# Patient Record
Sex: Female | Born: 1950 | Race: White | Hispanic: No | Smoking: Never smoker
Health system: Southern US, Community
[De-identification: ages and names within clinical notes are randomized; demographics above are authoritative.]

## PROBLEM LIST (undated history)

## (undated) DIAGNOSIS — K219 Gastro-esophageal reflux disease without esophagitis: Secondary | ICD-10-CM

## (undated) DIAGNOSIS — F419 Anxiety disorder, unspecified: Secondary | ICD-10-CM

## (undated) DIAGNOSIS — I209 Angina pectoris, unspecified: Secondary | ICD-10-CM

## (undated) DIAGNOSIS — R062 Wheezing: Secondary | ICD-10-CM

## (undated) DIAGNOSIS — E0500 Thyrotoxicosis with diffuse goiter without thyrotoxic crisis or storm: Secondary | ICD-10-CM

## (undated) DIAGNOSIS — E039 Hypothyroidism, unspecified: Secondary | ICD-10-CM

## (undated) DIAGNOSIS — F329 Major depressive disorder, single episode, unspecified: Secondary | ICD-10-CM

## (undated) DIAGNOSIS — R072 Precordial pain: Secondary | ICD-10-CM

## (undated) DIAGNOSIS — F32A Depression, unspecified: Secondary | ICD-10-CM

## (undated) DIAGNOSIS — I5189 Other ill-defined heart diseases: Secondary | ICD-10-CM

## (undated) DIAGNOSIS — G4730 Sleep apnea, unspecified: Secondary | ICD-10-CM

## (undated) DIAGNOSIS — E669 Obesity, unspecified: Secondary | ICD-10-CM

## (undated) DIAGNOSIS — I10 Essential (primary) hypertension: Secondary | ICD-10-CM

## (undated) DIAGNOSIS — M1990 Unspecified osteoarthritis, unspecified site: Secondary | ICD-10-CM

## (undated) DIAGNOSIS — E785 Hyperlipidemia, unspecified: Secondary | ICD-10-CM

## (undated) HISTORY — PX: OTHER SURGICAL HISTORY: SHX169

## (undated) HISTORY — DX: Wheezing: R06.2

## (undated) HISTORY — DX: Depression, unspecified: F32.A

## (undated) HISTORY — DX: Essential (primary) hypertension: I10

## (undated) HISTORY — DX: Gastro-esophageal reflux disease without esophagitis: K21.9

## (undated) HISTORY — PX: COLONOSCOPY: SHX5424

## (undated) HISTORY — DX: Other ill-defined heart diseases: I51.89

## (undated) HISTORY — DX: Hyperlipidemia, unspecified: E78.5

## (undated) HISTORY — DX: Thyrotoxicosis with diffuse goiter without thyrotoxic crisis or storm: E05.00

## (undated) HISTORY — DX: Anxiety disorder, unspecified: F41.9

## (undated) HISTORY — DX: Major depressive disorder, single episode, unspecified: F32.9

## (undated) HISTORY — DX: Obesity, unspecified: E66.9

## (undated) HISTORY — DX: Unspecified osteoarthritis, unspecified site: M19.90

## (undated) HISTORY — DX: Precordial pain: R07.2

---

## 1986-09-25 HISTORY — PX: BACK SURGERY: SHX140

## 2011-10-19 ENCOUNTER — Ambulatory Visit: Payer: Self-pay

## 2012-03-09 ENCOUNTER — Ambulatory Visit: Payer: Self-pay | Admitting: Sports Medicine

## 2012-06-23 ENCOUNTER — Emergency Department: Payer: Self-pay | Admitting: Emergency Medicine

## 2012-06-23 LAB — CBC WITH DIFFERENTIAL/PLATELET
Basophil #: 0.1 10*3/uL (ref 0.0–0.1)
Basophil %: 0.6 %
Eosinophil #: 0.1 10*3/uL (ref 0.0–0.7)
HCT: 40 % (ref 35.0–47.0)
Lymphocyte #: 2.2 10*3/uL (ref 1.0–3.6)
MCH: 29.5 pg (ref 26.0–34.0)
MCHC: 35.7 g/dL (ref 32.0–36.0)
MCV: 83 fL (ref 80–100)
Monocyte #: 0.9 x10 3/mm (ref 0.2–0.9)
Monocyte %: 8.7 %
Neutrophil #: 7.1 10*3/uL — ABNORMAL HIGH (ref 1.4–6.5)
Platelet: 237 10*3/uL (ref 150–440)
RDW: 13.6 % (ref 11.5–14.5)
WBC: 10.3 10*3/uL (ref 3.6–11.0)

## 2012-06-23 LAB — COMPREHENSIVE METABOLIC PANEL
Albumin: 3.9 g/dL (ref 3.4–5.0)
Alkaline Phosphatase: 81 U/L (ref 50–136)
BUN: 15 mg/dL (ref 7–18)
Bilirubin,Total: 1 mg/dL (ref 0.2–1.0)
Co2: 30 mmol/L (ref 21–32)
EGFR (African American): >60
Glucose: 116 mg/dL — ABNORMAL HIGH (ref 65–99)
Potassium: 3.9 mmol/L (ref 3.5–5.1)
SGOT(AST): 12 U/L — ABNORMAL LOW (ref 15–37)
SGPT (ALT): 16 U/L (ref 12–78)
Total Protein: 7.3 g/dL (ref 6.4–8.2)

## 2012-06-23 LAB — PROTIME-INR: Prothrombin Time: 12.9 secs (ref 11.5–14.7)

## 2012-06-23 LAB — TROPONIN I: Troponin-I: <0.02 ng/mL

## 2012-08-28 ENCOUNTER — Ambulatory Visit: Payer: Self-pay | Admitting: Gastroenterology

## 2012-08-28 LAB — KOH PREP

## 2012-08-29 LAB — PATHOLOGY REPORT

## 2013-05-26 HISTORY — PX: JOINT REPLACEMENT: SHX530

## 2013-06-02 ENCOUNTER — Ambulatory Visit: Payer: Self-pay | Admitting: General Practice

## 2013-06-02 LAB — BASIC METABOLIC PANEL
Anion Gap: 3 — ABNORMAL LOW (ref 7–16)
BUN: 18 mg/dL (ref 7–18)
Chloride: 103 mmol/L (ref 98–107)
Co2: 32 mmol/L (ref 21–32)
Creatinine: 0.77 mg/dL (ref 0.60–1.30)
EGFR (African American): >60
Glucose: 88 mg/dL (ref 65–99)
Osmolality: 277 (ref 275–301)
Potassium: 3.8 mmol/L (ref 3.5–5.1)

## 2013-06-02 LAB — PROTIME-INR
INR: 1
Prothrombin Time: 13.1 secs (ref 11.5–14.7)

## 2013-06-02 LAB — URINALYSIS, COMPLETE
Bacteria: NONE SEEN
Bilirubin,UR: NEGATIVE
Ketone: NEGATIVE
Leukocyte Esterase: NEGATIVE
Nitrite: NEGATIVE
Ph: 6 (ref 4.5–8.0)
Protein: NEGATIVE
RBC,UR: 1 /HPF (ref 0–5)
Specific Gravity: 1.016 (ref 1.003–1.030)
Squamous Epithelial: <1

## 2013-06-02 LAB — CBC
HCT: 37.5 % (ref 35.0–47.0)
HGB: 13.1 g/dL (ref 12.0–16.0)
MCH: 28.4 pg (ref 26.0–34.0)
MCHC: 34.8 g/dL (ref 32.0–36.0)
MCV: 82 fL (ref 80–100)
Platelet: 194 10*3/uL (ref 150–440)
RDW: 13.9 % (ref 11.5–14.5)

## 2013-06-02 LAB — SEDIMENTATION RATE: Erythrocyte Sed Rate: 8 mm/hr (ref 0–30)

## 2013-06-06 ENCOUNTER — Ambulatory Visit: Payer: Self-pay

## 2013-06-16 ENCOUNTER — Inpatient Hospital Stay: Payer: Self-pay | Admitting: General Practice

## 2013-06-17 LAB — PLATELET COUNT: Platelet: 205 10*3/uL (ref 150–440)

## 2013-06-17 LAB — BASIC METABOLIC PANEL
BUN: 13 mg/dL (ref 7–18)
Calcium, Total: 8.3 mg/dL — ABNORMAL LOW (ref 8.5–10.1)
Co2: 30 mmol/L (ref 21–32)
Creatinine: 0.83 mg/dL (ref 0.60–1.30)
EGFR (African American): >60
Glucose: 125 mg/dL — ABNORMAL HIGH (ref 65–99)
Osmolality: 270 (ref 275–301)
Sodium: 134 mmol/L — ABNORMAL LOW (ref 136–145)

## 2013-06-17 LAB — HEMOGLOBIN: HGB: 11.6 g/dL — ABNORMAL LOW (ref 12.0–16.0)

## 2013-06-18 LAB — HEMOGLOBIN: HGB: 11.4 g/dL — ABNORMAL LOW (ref 12.0–16.0)

## 2013-06-18 LAB — BASIC METABOLIC PANEL
Co2: 28 mmol/L (ref 21–32)
EGFR (African American): >60
Glucose: 101 mg/dL — ABNORMAL HIGH (ref 65–99)
Osmolality: 265 (ref 275–301)
Potassium: 3.6 mmol/L (ref 3.5–5.1)

## 2013-06-18 LAB — PLATELET COUNT: Platelet: 209 10*3/uL (ref 150–440)

## 2015-01-15 NOTE — Discharge Summary (Signed)
PATIENT NAME:  Tiffany Delacruz, Tiffany Delacruz MR#:  [id number] DATE OF BIRTH:  Oct 08, 1950  DATE OF ADMISSION:  06/16/2013 DATE OF DISCHARGE:  06/19/2013   DICTATING FOR: Jeneen Rinks P. Holley Bouche., MD  ADMITTING DIAGNOSIS: Degenerative arthrosis of right knee.   DISCHARGE DIAGNOSIS: Degenerative arthrosis of right knee.   HISTORY: The patient is a 64 year old female who has been followed at [facility] for progression of right knee pain. She had reported a long history of progressive right knee pain dating back several years. She had received a series of Synvisc injections with only modest improvement in her symptoms. Her latest series of injections earlier in the year did not successfully relieve her right knee pain. The patient states that the pain was localized mostly along the medial aspect of the knee. She had noticed progressive bowing of her knee. The patient states that the pain was aggravated with weight-bearing activities. She had also received physical therapy in the past primarily for strengthening. At the time of surgery, she was not using any ambulatory aid. She denied any gross locking or giving-way of the knee, but did occasionally have some near-giving-way. The patient states that the pain had increased to the point that it was significantly interfering with her activities of daily living. X-rays taken in [facility] showed bone-on-bone articulation with associated varus alignment. She was noted to have subchondral sclerosis as well as osteophyte formation along with degenerative changes to the patellofemoral articulation. After discussion of the risks and benefits of surgical intervention, the patient expressed her understanding of the risks and benefits and agreed for plans for surgical intervention.   PROCEDURE: Right total knee arthroplasty using computer-assisted navigation.   ANESTHESIA: Spinal.   SOFT TISSUE RELEASE: Anterior cruciate ligament, posterior cruciate ligament, deep  and superficial medial collateral ligaments as well as patellofemoral ligament.   IMPLANTS UTILIZED: DePuy PFC Sigma size 4 posterior stabilized femoral component (cemented), size 4 MBT tibial component (cemented), 35 mm 3-pegged oval dome patella (cemented), and a 10 mm stabilized rotating platform polyethylene insert. Gentamicin cement was utilized.   HOSPITAL COURSE: The patient tolerated the procedure very well. She had no complications. She was then taken to the PACU, where she was stabilized and then transferred to the orthopedic floor. The patient began receiving anticoagulation therapy of Lovenox 30 mg subcutaneous q.12 hours per anesthesia and pharmacy protocol. She was fitted with TED stockings bilaterally. These were allowed to be removed 1 hour per 8-hour shift. The right one was applied on day 2 following removal of the Hemovac and dressing change. The patient was also fitted with the AVI compression foot pumps bilaterally set at 80 mmHg. Her calves have been nontender. There has been no evidence of any DVTs. Negative Homans sign. Heels were elevated off the bed using rolled towels.   The patient has denied any chest pains or shortness of breath. Vital signs have been stable. She has been afebrile. Hemodynamically, she was stable, and no transfusions were given other than the Autovac transfusion given the first 6 hours postoperatively.   Physical therapy was initiated on day 1 for gait training and transfers. She has done very well. Upon being discharged, was ambulating greater than 200 feet. Was able go up 4 sets of steps. Range of motion of the knee showed full extension, with flexion being 90 degrees passively. Physical therapy was also initiated on day 1 for ADLs and assistive devices. She has progressed very nicely.   The patient's IV, Foley and Hemovac were discontinued  on day 2 along with the dressing change. The Polar Care was reapplied to the surgical leg, maintaining a temperature of  40 to 50 degrees Fahrenheit.   DISPOSITION: The patient is being discharged to home in improved stable condition.   DISCHARGE INSTRUCTIONS:  1. She will continue weight-bearing as tolerated. Continue using a walker until cleared by physical therapy to go to a quad cane.  2. She will receive home health PT.  3. Continue with TED stockings bilaterally. These are to be worn during the day, but may be removed at night.  4. Also continue the Polar Care, maintaining a temperature of 40 to 50 degrees Fahrenheit.  5. She was instructed on wound care.  6. She has a followup appointment in the [facility] on October 7th at 10:15.  7. She is to call the clinic sooner if any temperatures of 101.5 or greater or excessive bleeding.  8. She is placed on a regular diet.  9. She is to resume taking her medication that she was on prior to surgery. A prescription for oxycodone 5 to 10 mg q.4-6 hours p.r.n. for pain was given as well as Ultram 50 to 100 mg q.4-6 hours p.r.n. for pain. A prescription for Lovenox 40 mg subcutaneously daily for 14 days, then discontinue and begin taking one 81 mg enteric-coated aspirin.   PAST MEDICAL HISTORY: Hypertension, GERD, Graves disease, hyperthyroidism, Bell's palsy in 2011, obstructive sleep apnea.   ____________________________ Vance Peper, PA jrw:OSi D: 06/19/2013 07:15:00 ET T: 06/19/2013 08:03:01 ET JOB#: [id number]  cc: Vance Peper, PA, <Dictator> JON WOLFE PA ELECTRONICALLY SIGNED 06/24/2013 20:55

## 2015-01-15 NOTE — Op Note (Signed)
PATIENT NAME:  Tiffany Delacruz, Tiffany Delacruz MR#:  [id number] DATE OF BIRTH:  1951-09-01  DATE OF PROCEDURE:  06/16/2013  PREOPERATIVE DIAGNOSIS: Degenerative arthrosis of the right knee.   POSTOPERATIVE DIAGNOSIS: Degenerative arthrosis of the right knee.   PROCEDURE PERFORMED: Right total knee arthroplasty using computer-assisted navigation.   SURGEON: Rolene Arbour, M.D.  ASSISTANT:  Vance Peper, PA (required to maintain retraction throughout the procedure)   ANESTHESIA: Spinal.   ESTIMATED BLOOD LOSS: 100 mL.   FLUIDS REPLACED: 2300 mL of crystalloid.   TOURNIQUET TIME: 88 minutes.   DRAINS: Two medium drains to a reinfusion system.   SOFT TISSUE RELEASES: Anterior cruciate ligament, posterior cruciate ligament, deep and superficial medial collateral ligament and patellofemoral ligament.   IMPLANTS UTILIZED: DePuy PFC Sigma size 4 posterior stabilized femoral component (cemented), size 4 MBT tibia component (cemented), 35 mm three-peg oval dome patella (cemented), and a 10 mm stabilized rotating platform polyethylene insert. Gentamicin cement was utilized.   INDICATIONS FOR SURGERY: The patient is a 64 year old female who has been seen for complaints of progressive right knee pain. X-rays demonstrated severe degenerative changes in tricompartmental fashion with relative varus deformity. After discussion of the risks and benefits of surgical intervention, the patient expressed understanding of the risks, benefits, and agreed with plans for surgical intervention.   PROCEDURE IN DETAIL: The patient spoon the operating room and, after adequate spinal anesthesia was achieved, a tourniquet was placed on the patient's upper right thigh. The patient's right knee and leg were cleaned and prepped with alcohol and DuraPrep and draped in the usual sterile fashion. A "timeout" was performed as per usual protocol. The right lower extremity was exsanguinated using an Esmarch, and the tourniquet was inflated  to 300 mmHg. An anterior longitudinal incision was made followed by a standard mid vastus approach. A moderate effusion was evacuated. The deep fibers of the medial collateral ligament were elevated in a subperiosteal fashion off the medial flare of the tibia so as to maintain a continuous soft tissue sleeve. The patella was subluxed laterally and the patellofemoral ligament was incised. Inspection of the knee demonstrated severe degenerative changes most notably to the medial compartment. Prominent osteophytes were debrided using a rongeur. Anterior and posterior cruciate ligaments were excised. Two 4.0 mm Schanz pins were inserted into the femur and into the tibia for attachment of the array of trackers used for computer-assisted navigation. Hip center was identified using circumduction technique. Distal landmarks were mapped using the computer. The distal femur and proximal tibia were mapped using the computer. Distal femoral cutting guide was positioned using computer-assisted navigation so as to achieve a 5 degree distal valgus cut. Cut was performed and verified using the computer. Distal femur was sized and it was felt that a size 4 femoral component was appropriate. A size 4 cutting guide was positioned and the anterior cut was performed and verified using the computer. This was followed by completion of the posterior and chamfer cuts. Femoral cutting guide for the central box was then positioned and the central box cut was performed.   Attention was then directed to the proximal tibia. Medial and lateral menisci were excised. The extramedullary tibial cutting guide was positioned using computer-assisted navigation so as to achieve a 0 degree varus valgus alignment and 0 degree posterior slope. Cut was performed and verified using the computer. The proximal tibia was sized and it was felt that a size 4 tibial tray was appropriate. Tibial and femoral trials were inserted followed by insertion  of a 10 mm  polyethylene trial. The knee was felt to be tight medially. A Cobb elevator was used to elevate the superficial fibers of the medial collateral ligament. This allowed for excellent mediolateral soft tissue balancing both in full extension and in flexion. Finally, the patella was cut and prepared so as to accommodate a 35 mm three-peg oval dome patella. Patellar trial was placed, and the knee was placed through a range of motion with excellent patellar tracking appreciated.   Femoral component was removed after debridement of posterior osteophytes. Central post hole for the tibial component was reamed followed by insertion of a keel punch. Tibial trials were then removed. The cut surfaces of bone were irrigated with copious amounts of normal saline with antibiotic solution using pulsatile lavage and then suctioned dry. Polymethyl methacrylate cement with gentamicin was prepared in the usual fashion using a vacuum mixer. Cement was applied to the cut surface of the proximal tibia as well as along the undersurface of a size 4 MBT tibial component. The tibial component was positioned and impacted into place. Excess cement was removed using freer elevators. Cement was applied to the cut surface of the femur as well as along the posterior flanges of a size 4 posterior stabilized femoral component. Femoral component was positioned and impacted into place. Excess cement was removed using freer elevators. A 10 mm polyethylene trial was inserted and the knee was brought in full extension with steady axial compression applied. Finally, cement was applied to the backside of a 35 mm three-peg oval dome patella and the patellar component was positioned and patellar clamp applied. Excess cement was removed using freer elevators.   After adequate curing of cement, the tourniquet was deflated after total tourniquet time of 88 minutes. Hemostasis was achieved using electrocautery. The knee was irrigated with copious amounts of  normal saline with antibiotic solution using pulsatile lavage and then suctioned dry. The knee was inspected for any residual cement debris. Then, 20 mL of 1.3% Exparel and 40 mL of normal saline was injected along the posterior region, medial and lateral gutters, and along the arthrotomy site. A 10 mm stabilized rotating platform polyethylene insert was inserted and the knee was placed through a range of motion. Excellent mediolateral soft tissue balance was appreciated and excellent patellar tracking was appreciated. The two medium drains were placed in the wound bed and brought out through a separate stab incision to be attached to a reinfusion system. The medial parapatellar portion of the incision was reapproximated using interrupted sutures of #1 Vicryl. The subcutaneous tissue was injected with 30 mL of 0.25% Marcaine with epinephrine. The subcutaneous tissue was approximated in layers using first #0 Vicryl followed by 2-0 Vicryl. Skin was closed with skin staples. A sterile dressing was applied. The patient tolerated the procedure well. She was transported to the recovery room in stable condition.   ____________________________ Laurice Record. Holley Bouche., MD jph:cc D: 06/16/2013 23:02:27 ET T: 06/16/2013 23:42:41 ET JOB#: [id number]  cc: Laurice Record. Holley Bouche., MD, <Dictator> Laurice Record Holley Bouche MD ELECTRONICALLY SIGNED 06/28/2013 9:37

## 2015-04-02 ENCOUNTER — Other Ambulatory Visit: Payer: Self-pay

## 2015-04-02 NOTE — Telephone Encounter (Signed)
Patient was last seen on 12/05/13 for thyroid with f/u prn. Practice partner number is [phone] and pharmacy is [facility].

## 2015-04-03 MED ORDER — LEVOTHYROXINE SODIUM 200 MCG PO TABS: 200.0000 ug | ORAL_TABLET | Freq: Every day | ORAL | Status: DC

## 2015-04-03 NOTE — Telephone Encounter (Signed)
Please have patient schedule an appointment to see Tiffany Delacruz She is overdue for visit and labs I'll approve one month to give her time to get in to be seen

## 2015-04-05 NOTE — Telephone Encounter (Signed)
Left patient a voicemail to return my call and schedule a follow-up visit.

## 2015-04-06 NOTE — Telephone Encounter (Signed)
Called and left patient a voicemail to return my call and schedule a follow-up visit.

## 2015-04-07 NOTE — Telephone Encounter (Signed)
Called and left patient a voicemail to return my call and schedule a follow-up visit.

## 2015-04-15 DIAGNOSIS — K219 Gastro-esophageal reflux disease without esophagitis: Secondary | ICD-10-CM

## 2015-04-15 DIAGNOSIS — I10 Essential (primary) hypertension: Secondary | ICD-10-CM | POA: Insufficient documentation

## 2015-04-15 DIAGNOSIS — M1990 Unspecified osteoarthritis, unspecified site: Secondary | ICD-10-CM | POA: Insufficient documentation

## 2015-04-15 DIAGNOSIS — E785 Hyperlipidemia, unspecified: Secondary | ICD-10-CM | POA: Insufficient documentation

## 2015-04-15 DIAGNOSIS — F419 Anxiety disorder, unspecified: Secondary | ICD-10-CM | POA: Insufficient documentation

## 2015-04-15 DIAGNOSIS — F329 Major depressive disorder, single episode, unspecified: Secondary | ICD-10-CM | POA: Insufficient documentation

## 2015-04-15 DIAGNOSIS — E0500 Thyrotoxicosis with diffuse goiter without thyrotoxic crisis or storm: Secondary | ICD-10-CM | POA: Insufficient documentation

## 2015-04-15 DIAGNOSIS — F32A Depression, unspecified: Secondary | ICD-10-CM | POA: Insufficient documentation

## 2015-04-15 DIAGNOSIS — E669 Obesity, unspecified: Secondary | ICD-10-CM | POA: Insufficient documentation

## 2015-04-15 DIAGNOSIS — E039 Hypothyroidism, unspecified: Secondary | ICD-10-CM | POA: Insufficient documentation

## 2015-04-16 ENCOUNTER — Ambulatory Visit (INDEPENDENT_AMBULATORY_CARE_PROVIDER_SITE_OTHER): Payer: Medicare Other | Admitting: Unknown Physician Specialty

## 2015-04-16 ENCOUNTER — Encounter: Payer: Self-pay | Admitting: Unknown Physician Specialty

## 2015-04-16 VITALS — BP 132/69 | HR 91 | Temp 98.3°F | Ht 65.6 in | Wt 229.0 lb

## 2015-04-16 DIAGNOSIS — E039 Hypothyroidism, unspecified: Secondary | ICD-10-CM

## 2015-04-16 DIAGNOSIS — F32A Depression, unspecified: Secondary | ICD-10-CM

## 2015-04-16 DIAGNOSIS — I10 Essential (primary) hypertension: Secondary | ICD-10-CM | POA: Diagnosis not present

## 2015-04-16 DIAGNOSIS — F329 Major depressive disorder, single episode, unspecified: Secondary | ICD-10-CM | POA: Diagnosis not present

## 2015-04-16 LAB — MICROALBUMIN, URINE WAIVED
Creatinine, Urine Waived: 200 mg/dL (ref 10–300)
MICROALB, UR WAIVED: 30 mg/L — AB (ref 0–19)
Microalb/Creat Ratio: <30 mg/g (ref <30)

## 2015-04-16 LAB — LIPID PANEL PICCOLO, WAIVED
CHOLESTEROL PICCOLO, WAIVED: 187 mg/dL (ref <200)
Chol/HDL Ratio Piccolo,Waive: 3.3 mg/dL
HDL Chol Piccolo, Waived: 57 mg/dL — ABNORMAL LOW (ref >59)
LDL CHOL CALC PICCOLO WAIVED: 111 mg/dL — AB (ref <100)
Triglycerides Piccolo,Waived: 95 mg/dL (ref <150)
VLDL Chol Calc Piccolo,Waive: 19 mg/dL (ref <30)

## 2015-04-16 MED ORDER — LOSARTAN POTASSIUM 50 MG PO TABS: 50.0000 mg | ORAL_TABLET | Freq: Every day | ORAL | Status: DC

## 2015-04-16 MED ORDER — LEVOTHYROXINE SODIUM 200 MCG PO TABS: 200.0000 ug | ORAL_TABLET | Freq: Every day | ORAL | Status: DC

## 2015-04-16 MED ORDER — FLUOXETINE HCL 20 MG PO TABS: 20.0000 mg | ORAL_TABLET | Freq: Every day | ORAL | Status: DC

## 2015-04-16 NOTE — Progress Notes (Signed)
BP 132/69 mmHg  Pulse 91  Temp(Src) 98.3 F (36.8 C)  Ht 5' 5.6" (1.666 m)  Wt 229 lb (103.874 kg)  BMI 37.42 kg/m2  SpO2 97%  LMP 12/15/1991 (Approximate)   Subjective:    Patient ID: Tiffany Delacruz, female    DOB: July 17, 1951, 64 y.o.   MRN: [id number]  HPI: Tiffany Delacruz is a 64 y.o. female  Chief Complaint  Patient presents with  . Follow-up    pt states she is her for a medication follow-up   Hypothyroid Pt needs a refill.  No weight gain, temperature changes, or thinning of hair.  Dry skin probably related to age.    Hypertension This is a chronic (Taking Losartan daily but often misses a dose as feeling light headed.  this happens about twice a week.  ) problem. The problem is controlled. Pertinent negatives include no anxiety, chest pain, headaches, palpitations or shortness of breath. The current treatment provides significant improvement. Compliance problems: as above.    Depression This is stable.  See PHQ 2.  Uses an OTC sleep aid which she states works about as well as Ambien.    Relevant past medical, surgical, family and social history reviewed and updated as indicated. Interim medical history since our last visit reviewed. Allergies and medications reviewed and updated.    Review of Systems  Respiratory: Negative for shortness of breath.   Cardiovascular: Negative for chest pain and palpitations.  Neurological: Negative for headaches.    Per HPI unless specifically indicated above     Objective:    BP 132/69 mmHg  Pulse 91  Temp(Src) 98.3 F (36.8 C)  Ht 5' 5.6" (1.666 m)  Wt 229 lb (103.874 kg)  BMI 37.42 kg/m2  SpO2 97%  LMP 12/15/1991 (Approximate)  Wt Readings from Last 3 Encounters:  04/16/15 229 lb (103.874 kg)  07/09/14 224 lb (101.606 kg)    Physical Exam  Constitutional: She is oriented to person, place, and time. She appears well-developed and well-nourished. No distress.  HENT:  Head: Normocephalic and atraumatic.  Eyes:  Conjunctivae and lids are normal. Right eye exhibits no discharge. Left eye exhibits no discharge. No scleral icterus.  Cardiovascular: Normal rate and regular rhythm.   Pulmonary/Chest: Effort normal. No respiratory distress.  Abdominal: Normal appearance. There is no splenomegaly or hepatomegaly.  Musculoskeletal: Normal range of motion.  Neurological: She is alert and oriented to person, place, and time.  Skin: Skin is warm, dry and intact. No rash noted. No pallor.  Psychiatric: She has a normal mood and affect. Her behavior is normal. Judgment and thought content normal.      Assessment & Plan:   Problem List Items Addressed This Visit      Unprioritized   Hypothyroidism    Check TSH      Relevant Medications   levothyroxine (SYNTHROID, LEVOTHROID) 200 MCG tablet   Other Relevant Orders   TSH   Hypertension    Pt feeeling like she is on too high a dose.  Will reduce BP medication to Losartan 50 mg from Losartan HCTZ      Relevant Medications   losartan (COZAAR) 50 MG tablet   Other Relevant Orders   Lipid Panel Piccolo, Waived   Microalbumin, Urine Waived   Uric acid   Comprehensive metabolic panel   Depression - Primary    Depression is stable.  Continue present meds      Relevant Medications   FLUoxetine (PROZAC) 20 MG tablet  Follow up plan: Return in about 6 months (around 10/17/2015) for Physical.

## 2015-04-16 NOTE — Assessment & Plan Note (Signed)
Check TSH 

## 2015-04-16 NOTE — Assessment & Plan Note (Signed)
Depression is stable.  Continue present meds

## 2015-04-16 NOTE — Assessment & Plan Note (Signed)
Pt feeeling like she is on too high a dose.  Will reduce BP medication to Losartan 50 mg from Losartan HCTZ

## 2015-04-16 NOTE — Patient Instructions (Signed)
Check BP at home.  Goal is below 140/90

## 2015-04-17 LAB — COMPREHENSIVE METABOLIC PANEL
ALT: 10 IU/L (ref 0–32)
AST: 11 IU/L (ref 0–40)
Albumin/Globulin Ratio: 1.7 (ref 1.1–2.5)
Albumin: 4.1 g/dL (ref 3.6–4.8)
Alkaline Phosphatase: 81 IU/L (ref 39–117)
BILIRUBIN TOTAL: 0.8 mg/dL (ref 0.0–1.2)
BUN/Creatinine Ratio: 21 (ref 11–26)
BUN: 17 mg/dL (ref 8–27)
CALCIUM: 9.2 mg/dL (ref 8.7–10.3)
CO2: 23 mmol/L (ref 18–29)
Chloride: 100 mmol/L (ref 97–108)
Creatinine, Ser: 0.82 mg/dL (ref 0.57–1.00)
GFR, EST AFRICAN AMERICAN: 87 mL/min/{1.73_m2} (ref >59)
GFR, EST NON AFRICAN AMERICAN: 75 mL/min/{1.73_m2} (ref >59)
Globulin, Total: 2.4 g/dL (ref 1.5–4.5)
Glucose: 92 mg/dL (ref 65–99)
POTASSIUM: 4.3 mmol/L (ref 3.5–5.2)
SODIUM: 140 mmol/L (ref 134–144)
Total Protein: 6.5 g/dL (ref 6.0–8.5)

## 2015-04-17 LAB — URIC ACID: URIC ACID: 6.1 mg/dL (ref 2.5–7.1)

## 2015-04-17 LAB — TSH: TSH: 0.149 u[IU]/mL — ABNORMAL LOW (ref 0.450–4.500)

## 2015-04-19 ENCOUNTER — Other Ambulatory Visit: Payer: Self-pay | Admitting: Unknown Physician Specialty

## 2015-04-19 DIAGNOSIS — E039 Hypothyroidism, unspecified: Secondary | ICD-10-CM

## 2015-04-19 MED ORDER — LEVOTHYROXINE SODIUM 175 MCG PO TABS: 175.0000 ug | ORAL_TABLET | Freq: Every day | ORAL | Status: DC

## 2015-04-19 NOTE — Progress Notes (Signed)
Left a message about low TSH.  Will decrease dose of Synthroid to 175 mcgs.  Recheck TSH in 3 months

## 2015-07-23 ENCOUNTER — Other Ambulatory Visit: Payer: Self-pay | Admitting: Unknown Physician Specialty

## 2015-09-26 HISTORY — PX: BREAST BIOPSY: SHX20

## 2015-10-20 ENCOUNTER — Encounter: Payer: Self-pay | Admitting: Unknown Physician Specialty

## 2015-10-20 ENCOUNTER — Ambulatory Visit (INDEPENDENT_AMBULATORY_CARE_PROVIDER_SITE_OTHER): Payer: Medicare Other | Admitting: Unknown Physician Specialty

## 2015-10-20 VITALS — BP 134/85 | HR 86 | Temp 98.5°F | Ht 65.0 in | Wt 229.6 lb

## 2015-10-20 DIAGNOSIS — Z23 Encounter for immunization: Secondary | ICD-10-CM

## 2015-10-20 DIAGNOSIS — Z0000 Encounter for general adult medical examination without abnormal findings: Secondary | ICD-10-CM | POA: Diagnosis not present

## 2015-10-20 DIAGNOSIS — I10 Essential (primary) hypertension: Secondary | ICD-10-CM

## 2015-10-20 DIAGNOSIS — R7301 Impaired fasting glucose: Secondary | ICD-10-CM

## 2015-10-20 DIAGNOSIS — E785 Hyperlipidemia, unspecified: Secondary | ICD-10-CM

## 2015-10-20 DIAGNOSIS — F32A Depression, unspecified: Secondary | ICD-10-CM

## 2015-10-20 DIAGNOSIS — L989 Disorder of the skin and subcutaneous tissue, unspecified: Secondary | ICD-10-CM | POA: Diagnosis not present

## 2015-10-20 DIAGNOSIS — F329 Major depressive disorder, single episode, unspecified: Secondary | ICD-10-CM

## 2015-10-20 DIAGNOSIS — E669 Obesity, unspecified: Secondary | ICD-10-CM | POA: Diagnosis not present

## 2015-10-20 DIAGNOSIS — E039 Hypothyroidism, unspecified: Secondary | ICD-10-CM

## 2015-10-20 LAB — BAYER DCA HB A1C WAIVED: HB A1C (BAYER DCA - WAIVED): 5.3 % (ref <7.0)

## 2015-10-20 NOTE — Assessment & Plan Note (Signed)
Stable, continue present medications.   

## 2015-10-20 NOTE — Assessment & Plan Note (Signed)
Discussed diet and exercise 

## 2015-10-20 NOTE — Progress Notes (Signed)
BP 134/85 mmHg  Pulse 86  Temp(Src) 98.5 F (36.9 C)  Ht 5\' 5"  (1.651 m)  Wt 229 lb 9.6 oz (104.146 kg)  BMI 38.21 kg/m2  SpO2 97%  LMP 12/15/1991 (Approximate)   Subjective:    Patient ID: Tiffany Delacruz, female    DOB: 12-04-1950, 65 y.o.   MRN: [id number]  HPI: Tiffany Delacruz is a 65 y.o. female  Chief Complaint  Patient presents with  . Medicare Wellness   Functional Status Survey: Is the patient deaf or have difficulty hearing?: No Does the patient have difficulty seeing, even when wearing glasses/contacts?: Yes (pt states she has to wear reading glasses) Does the patient have difficulty concentrating, remembering, or making decisions?: No Does the patient have difficulty walking or climbing stairs?: No Does the patient have difficulty dressing or bathing?: No Does the patient have difficulty doing errands alone such as visiting a doctor's office or shopping?: No  Fall Risk  10/20/2015  Falls in the past year? No   Depression screen [facility] 2/9 10/20/2015 04/16/2015  Decreased Interest 0 0  Down, Depressed, Hopeless 0 0  PHQ - 2 Score 0 0    Hypertension Using medications without difficulty Average home BPs a little better than this AM   No problems or lightheadedness No chest pain with exertion or shortness of breath No Edema Diet compliance: "probably poor" Exercise: No exercising   Relevant past medical, surgical, family and social history reviewed and updated as indicated. Interim medical history since our last visit reviewed. Allergies and medications reviewed and updated.  Review of Systems  Constitutional: Negative.   HENT: Negative.   Eyes: Negative.   Respiratory: Negative.   Cardiovascular: Negative.   Gastrointestinal: Negative.   Endocrine: Negative.   Genitourinary: Negative.   Musculoskeletal: Negative.  Negative for myalgias.  Skin:       Red spot on nose.   Allergic/Immunologic: Negative.   Neurological: Negative.   Hematological:  Negative.   Psychiatric/Behavioral: Negative.     Per HPI unless specifically indicated above     Objective:    BP 134/85 mmHg  Pulse 86  Temp(Src) 98.5 F (36.9 C)  Ht 5\' 5"  (1.651 m)  Wt 229 lb 9.6 oz (104.146 kg)  BMI 38.21 kg/m2  SpO2 97%  LMP 12/15/1991 (Approximate)  Wt Readings from Last 3 Encounters:  10/20/15 229 lb 9.6 oz (104.146 kg)  04/16/15 229 lb (103.874 kg)  07/09/14 224 lb (101.606 kg)    Physical Exam  Constitutional: She is oriented to person, place, and time. She appears well-developed and well-nourished.  HENT:  Head: Normocephalic and atraumatic.  Eyes: Pupils are equal, round, and reactive to light. Right eye exhibits no discharge. Left eye exhibits no discharge. No scleral icterus.  Neck: Normal range of motion. Neck supple. Carotid bruit is not present. No thyromegaly present.  Cardiovascular: Normal rate, regular rhythm and normal heart sounds.  Exam reveals no gallop and no friction rub.   No murmur heard. Pulmonary/Chest: Effort normal and breath sounds normal. No respiratory distress. She has no wheezes. She has no rales.  Abdominal: Soft. Bowel sounds are normal. There is no tenderness. There is no rebound.  Genitourinary: No breast swelling, tenderness or discharge.  Musculoskeletal: Normal range of motion.  Lymphadenopathy:    She has no cervical adenopathy.  Neurological: She is alert and oriented to person, place, and time.  Skin: Skin is warm, dry and intact. No rash noted.  Red lesion on nose  Psychiatric: She  has a normal mood and affect. Her speech is normal and behavior is normal. Judgment and thought content normal. Cognition and memory are normal.    Results for orders placed or performed in visit on 04/16/15  Lipid Panel Piccolo, [employer]  Result Value Ref Range   Cholesterol Piccolo, Waived 187 <200 mg/dL   HDL Chol Piccolo, Waived 57 (L) >59 mg/dL   Triglycerides Piccolo,Waived 95 <150 mg/dL   Chol/HDL Ratio Piccolo,Waive 3.3  mg/dL   LDL Chol Calc Piccolo Waived 111 (H) <100 mg/dL   VLDL Chol Calc Piccolo,Waive 19 <30 mg/dL  Microalbumin, Urine Waived  Result Value Ref Range   Microalb, Ur Waived 30 (H) 0 - 19 mg/L   Creatinine, Urine Waived 200 10 - 300 mg/dL   Microalb/Creat Ratio <30 <30 mg/g  Uric acid  Result Value Ref Range   Uric Acid 6.1 2.5 - 7.1 mg/dL  Comprehensive metabolic panel  Result Value Ref Range   Glucose 92 65 - 99 mg/dL   BUN 17 8 - 27 mg/dL   Creatinine, Ser 0.82 0.57 - 1.00 mg/dL   GFR calc non Af Amer 75 >59 mL/min/1.73   GFR calc Af Amer 87 >59 mL/min/1.73   BUN/Creatinine Ratio 21 11 - 26   Sodium 140 134 - 144 mmol/L   Potassium 4.3 3.5 - 5.2 mmol/L   Chloride 100 97 - 108 mmol/L   CO2 23 18 - 29 mmol/L   Calcium 9.2 8.7 - 10.3 mg/dL   Total Protein 6.5 6.0 - 8.5 g/dL   Albumin 4.1 3.6 - 4.8 g/dL   Globulin, Total 2.4 1.5 - 4.5 g/dL   Albumin/Globulin Ratio 1.7 1.1 - 2.5   Bilirubin Total 0.8 0.0 - 1.2 mg/dL   Alkaline Phosphatase 81 39 - 117 IU/L   AST 11 0 - 40 IU/L   ALT 10 0 - 32 IU/L  TSH  Result Value Ref Range   TSH 0.149 (L) 0.450 - 4.500 uIU/mL      Assessment & Plan:   Problem List Items Addressed This Visit      Unprioritized   Hypothyroidism    Check TSH      Relevant Orders   TSH   Hypertension    Stable, continue present medications.        Relevant Orders   Comprehensive metabolic panel   Hyperlipidemia   Depression    Stable, continue present medications.        Obesity    Discussed diet and exercise       Other Visit Diagnoses    Need for pneumococcal vaccination    -  Primary    Relevant Orders    Pneumococcal conjugate vaccine 13-valent IM (Completed)    Immunization due        Relevant Orders    Flu Vaccine QUAD 36+ mos IM (Completed)    IFG (impaired fasting glucose)        Relevant Orders    Bayer DCA Hb A1c Waived    Routine general medical examination at a health care facility        Relevant Orders     Hepatitis C antibody    Skin lesion        refer to dermatology    Relevant Orders    Ambulatory referral to Dermatology        Follow up plan: Return in about 6 months (around 04/18/2016).

## 2015-10-20 NOTE — Assessment & Plan Note (Signed)
Check TSH 

## 2015-10-21 ENCOUNTER — Encounter: Payer: Self-pay | Admitting: Unknown Physician Specialty

## 2015-10-21 LAB — COMPREHENSIVE METABOLIC PANEL
ALT: 8 IU/L (ref 0–32)
AST: 13 IU/L (ref 0–40)
Albumin/Globulin Ratio: 1.7 (ref 1.1–2.5)
Albumin: 4.1 g/dL (ref 3.6–4.8)
Alkaline Phosphatase: 70 IU/L (ref 39–117)
BUN/Creatinine Ratio: 19 (ref 11–26)
BUN: 16 mg/dL (ref 8–27)
Bilirubin Total: 0.8 mg/dL (ref 0.0–1.2)
CALCIUM: 9.2 mg/dL (ref 8.7–10.3)
CHLORIDE: 99 mmol/L (ref 96–106)
CO2: 24 mmol/L (ref 18–29)
Creatinine, Ser: 0.85 mg/dL (ref 0.57–1.00)
GFR calc Af Amer: 83 mL/min/{1.73_m2} (ref >59)
GFR calc non Af Amer: 72 mL/min/{1.73_m2} (ref >59)
GLOBULIN, TOTAL: 2.4 g/dL (ref 1.5–4.5)
Glucose: 98 mg/dL (ref 65–99)
Potassium: 4 mmol/L (ref 3.5–5.2)
SODIUM: 140 mmol/L (ref 134–144)
Total Protein: 6.5 g/dL (ref 6.0–8.5)

## 2015-10-21 LAB — HEPATITIS C ANTIBODY: Hep C Virus Ab: <0.1 s/co ratio (ref 0.0–0.9)

## 2015-10-21 LAB — TSH: TSH: 4.48 u[IU]/mL (ref 0.450–4.500)

## 2015-10-22 ENCOUNTER — Other Ambulatory Visit: Payer: Self-pay | Admitting: Unknown Physician Specialty

## 2015-11-12 ENCOUNTER — Encounter: Payer: Self-pay | Admitting: Unknown Physician Specialty

## 2015-11-12 ENCOUNTER — Ambulatory Visit (INDEPENDENT_AMBULATORY_CARE_PROVIDER_SITE_OTHER): Payer: Medicare Other | Admitting: Unknown Physician Specialty

## 2015-11-12 VITALS — BP 123/79 | HR 90 | Temp 97.7°F | Ht 64.6 in | Wt 230.4 lb

## 2015-11-12 DIAGNOSIS — J3089 Other allergic rhinitis: Secondary | ICD-10-CM

## 2015-11-12 DIAGNOSIS — R05 Cough: Secondary | ICD-10-CM | POA: Diagnosis not present

## 2015-11-12 DIAGNOSIS — R059 Cough, unspecified: Secondary | ICD-10-CM

## 2015-11-12 MED ORDER — FLUTICASONE PROPIONATE 50 MCG/ACT NA SUSP: 2.0000 | Freq: Every day | NASAL | Status: DC

## 2015-11-12 NOTE — Progress Notes (Signed)
-    BP 123/79 mmHg  Pulse 90  Temp(Src) 97.7 F (36.5 C)  Ht 5' 4.6" (1.641 m)  Wt 230 lb 6.4 oz (104.509 kg)  BMI 38.81 kg/m2  SpO2 97%  LMP 12/15/1991 (Approximate)   Subjective:    Patient ID: Tiffany Delacruz, female    DOB: November 14, 1950, 65 y.o.   MRN: [id number]  HPI: Tiffany Delacruz is a 65 y.o. female  Chief Complaint  Patient presents with  . Cough    pt states she has had a cough for about 6 weeks. States she coughs up clear phlegm.   Cough States she has a cough that comes and goes.  She is taking Mucinex which helps.  This is ongoing from an illness that she had.  No aggravating or worsening factors.  She states she can go for long parts of the day without coughing.  No fever or nasal congestion.  She has never smoked but lived with a smoker and her dad was a smoker  Relevant past medical, surgical, family and social history reviewed and updated as indicated. Interim medical history since our last visit reviewed. Allergies and medications reviewed and updated.  Review of Systems  Per HPI unless specifically indicated above     Objective:    BP 123/79 mmHg  Pulse 90  Temp(Src) 97.7 F (36.5 C)  Ht 5' 4.6" (1.641 m)  Wt 230 lb 6.4 oz (104.509 kg)  BMI 38.81 kg/m2  SpO2 97%  LMP 12/15/1991 (Approximate)  Wt Readings from Last 3 Encounters:  11/12/15 230 lb 6.4 oz (104.509 kg)  10/20/15 229 lb 9.6 oz (104.146 kg)  04/16/15 229 lb (103.874 kg)    Physical Exam  Constitutional: She is oriented to person, place, and time. She appears well-developed and well-nourished. No distress.  HENT:  Head: Normocephalic and atraumatic.  Right Ear: Tympanic membrane and ear canal normal.  Left Ear: Tympanic membrane and ear canal normal.  Nose: Rhinorrhea present. Right sinus exhibits no maxillary sinus tenderness and no frontal sinus tenderness. Left sinus exhibits no maxillary sinus tenderness and no frontal sinus tenderness.  Mouth/Throat: Mucous membranes are normal.  Posterior oropharyngeal erythema present.  Eyes: Conjunctivae and lids are normal. Right eye exhibits no discharge. Left eye exhibits no discharge. No scleral icterus.  Cardiovascular: Normal rate and regular rhythm.   Pulmonary/Chest: Effort normal and breath sounds normal. No respiratory distress.  Abdominal: Normal appearance. There is no splenomegaly or hepatomegaly.  Musculoskeletal: Normal range of motion.  Neurological: She is alert and oriented to person, place, and time.  Skin: Skin is intact. No rash noted. No pallor.  Psychiatric: She has a normal mood and affect. Her behavior is normal. Judgment and thought content normal.      Assessment & Plan:   Problem List Items Addressed This Visit      Unprioritized   Other allergic rhinitis - Primary    Rx for flonase.         Other Visit Diagnoses    Cough        suspect related to allergies and post nasal drip        Follow up plan: Return if symptoms worsen or fail to improve.

## 2015-11-12 NOTE — Assessment & Plan Note (Signed)
Rx for flonase. 

## 2015-12-27 ENCOUNTER — Telehealth: Payer: Self-pay

## 2015-12-27 MED ORDER — FLUTICASONE PROPIONATE 50 MCG/ACT NA SUSP: 2.0000 | Freq: Every day | NASAL | Status: DC

## 2015-12-27 NOTE — Telephone Encounter (Signed)
We refilled patient's flonase in February, but the pharmacy sent a fax wanting [place] to send a 90 day prescription.

## 2016-01-19 ENCOUNTER — Other Ambulatory Visit: Payer: Self-pay | Admitting: Unknown Physician Specialty

## 2016-04-18 ENCOUNTER — Encounter: Payer: Self-pay | Admitting: Unknown Physician Specialty

## 2016-04-18 ENCOUNTER — Ambulatory Visit (INDEPENDENT_AMBULATORY_CARE_PROVIDER_SITE_OTHER): Payer: Medicare Other | Admitting: Unknown Physician Specialty

## 2016-04-18 DIAGNOSIS — I10 Essential (primary) hypertension: Secondary | ICD-10-CM

## 2016-04-18 DIAGNOSIS — F329 Major depressive disorder, single episode, unspecified: Secondary | ICD-10-CM | POA: Diagnosis not present

## 2016-04-18 DIAGNOSIS — E785 Hyperlipidemia, unspecified: Secondary | ICD-10-CM | POA: Diagnosis not present

## 2016-04-18 DIAGNOSIS — F32A Depression, unspecified: Secondary | ICD-10-CM

## 2016-04-18 NOTE — Assessment & Plan Note (Signed)
Refusing statins 

## 2016-04-18 NOTE — Progress Notes (Signed)
BP 134/83 (BP Location: Left Arm, Patient Position: Sitting, Cuff Size: Large)   Pulse 81   Temp 98 F (36.7 C)   Ht 5' 5.1" (1.654 m)   Wt 227 lb 12.8 oz (103.3 kg)   LMP 12/15/1991 (Approximate)   SpO2 96%   BMI 37.79 kg/m    Subjective:    Patient ID: Tiffany Delacruz, female    DOB: 1950-12-22, 65 y.o.   MRN: [id number]  HPI: Tiffany Delacruz is a 65 y.o. female  Chief Complaint  Patient presents with  . Depression  . Hypertension  . Hypothyroidism   Hypertension Using medications without difficulty Average home BPs   No problems or lightheadedness No chest pain with exertion Stopped her medication and then doubled up due to BP being high. Was SOB while doubled up but this has resolved.   No Edema  Hyperlipidemia Not checking labs as refusing statins Using medications without problems: Exercise:swimming  Depression Doing well Depression screen [facility] 2/9 04/18/2016 10/20/2015 04/16/2015  Decreased Interest 0 0 0  Down, Depressed, Hopeless 0 0 0  PHQ - 2 Score 0 0 0    Relevant past medical, surgical, family and social history reviewed and updated as indicated. Interim medical history since our last visit reviewed. Allergies and medications reviewed and updated.  Review of Systems  Respiratory: Positive for shortness of breath.     Per HPI unless specifically indicated above     Objective:    BP 134/83 (BP Location: Left Arm, Patient Position: Sitting, Cuff Size: Large)   Pulse 81   Temp 98 F (36.7 C)   Ht 5' 5.1" (1.654 m)   Wt 227 lb 12.8 oz (103.3 kg)   LMP 12/15/1991 (Approximate)   SpO2 96%   BMI 37.79 kg/m   Wt Readings from Last 3 Encounters:  04/18/16 227 lb 12.8 oz (103.3 kg)  11/12/15 230 lb 6.4 oz (104.5 kg)  10/20/15 229 lb 9.6 oz (104.1 kg)    Physical Exam  Constitutional: She is oriented to person, place, and time. She appears well-developed and well-nourished. No distress.  HENT:  Head: Normocephalic and atraumatic.  Eyes:  Conjunctivae and lids are normal. Right eye exhibits no discharge. Left eye exhibits no discharge. No scleral icterus.  Neck: Normal range of motion. Neck supple. No JVD present. Carotid bruit is not present.  Cardiovascular: Normal rate, regular rhythm and normal heart sounds.   Pulmonary/Chest: Effort normal and breath sounds normal.  Abdominal: Normal appearance. There is no splenomegaly or hepatomegaly.  Musculoskeletal: Normal range of motion.  Neurological: She is alert and oriented to person, place, and time.  Skin: Skin is warm, dry and intact. No rash noted. No pallor.  Psychiatric: She has a normal mood and affect. Her behavior is normal. Judgment and thought content normal.    Results for orders placed or performed in visit on 10/20/15  Hepatitis C antibody  Result Value Ref Range   Hep C Virus Ab <0.1 0.0 - 0.9 s/co ratio  TSH  Result Value Ref Range   TSH 4.480 0.450 - 4.500 uIU/mL  Comprehensive metabolic panel  Result Value Ref Range   Glucose 98 65 - 99 mg/dL   BUN 16 8 - 27 mg/dL   Creatinine, Ser 0.85 0.57 - 1.00 mg/dL   GFR calc non Af Amer 72 >59 mL/min/1.73   GFR calc Af Amer 83 >59 mL/min/1.73   BUN/Creatinine Ratio 19 11 - 26   Sodium 140 134 - 144 mmol/L  Potassium 4.0 3.5 - 5.2 mmol/L   Chloride 99 96 - 106 mmol/L   CO2 24 18 - 29 mmol/L   Calcium 9.2 8.7 - 10.3 mg/dL   Total Protein 6.5 6.0 - 8.5 g/dL   Albumin 4.1 3.6 - 4.8 g/dL   Globulin, Total 2.4 1.5 - 4.5 g/dL   Albumin/Globulin Ratio 1.7 1.1 - 2.5   Bilirubin Total 0.8 0.0 - 1.2 mg/dL   Alkaline Phosphatase 70 39 - 117 IU/L   AST 13 0 - 40 IU/L   ALT 8 0 - 32 IU/L  Bayer DCA Hb A1c Waived  Result Value Ref Range   Bayer DCA Hb A1c Waived 5.3 <7.0 %      Assessment & Plan:   Problem List Items Addressed This Visit      Unprioritized   Depression    Stable, continue present medications.        Hyperlipidemia    Refusing statins      Hypertension    Stable, continue present  medications.         Other Visit Diagnoses   None.      Follow up plan: Return in about 6 months (around 10/19/2016) for physical.

## 2016-04-18 NOTE — Assessment & Plan Note (Signed)
Stable, continue present medications.   

## 2016-04-24 ENCOUNTER — Other Ambulatory Visit: Payer: Self-pay | Admitting: Unknown Physician Specialty

## 2016-07-14 ENCOUNTER — Other Ambulatory Visit: Payer: Self-pay | Admitting: Unknown Physician Specialty

## 2016-07-14 NOTE — Telephone Encounter (Signed)
Your patient 

## 2016-07-18 ENCOUNTER — Other Ambulatory Visit: Payer: Self-pay | Admitting: Unknown Physician Specialty

## 2016-10-11 ENCOUNTER — Other Ambulatory Visit: Payer: Self-pay | Admitting: Unknown Physician Specialty

## 2016-10-19 ENCOUNTER — Other Ambulatory Visit: Payer: Self-pay | Admitting: Unknown Physician Specialty

## 2016-10-24 ENCOUNTER — Encounter: Payer: Self-pay | Admitting: Unknown Physician Specialty

## 2016-10-24 ENCOUNTER — Ambulatory Visit (INDEPENDENT_AMBULATORY_CARE_PROVIDER_SITE_OTHER): Payer: Medicare Other | Admitting: Unknown Physician Specialty

## 2016-10-24 VITALS — BP 144/84 | HR 76 | Temp 98.0°F | Ht 65.6 in | Wt 219.2 lb

## 2016-10-24 DIAGNOSIS — I10 Essential (primary) hypertension: Secondary | ICD-10-CM

## 2016-10-24 DIAGNOSIS — F3341 Major depressive disorder, recurrent, in partial remission: Secondary | ICD-10-CM | POA: Diagnosis not present

## 2016-10-24 DIAGNOSIS — E785 Hyperlipidemia, unspecified: Secondary | ICD-10-CM

## 2016-10-24 DIAGNOSIS — E039 Hypothyroidism, unspecified: Secondary | ICD-10-CM

## 2016-10-24 DIAGNOSIS — Z0000 Encounter for general adult medical examination without abnormal findings: Secondary | ICD-10-CM | POA: Diagnosis not present

## 2016-10-24 NOTE — Assessment & Plan Note (Signed)
Check TSH 

## 2016-10-24 NOTE — Assessment & Plan Note (Signed)
Not checking as refusing statins

## 2016-10-24 NOTE — Assessment & Plan Note (Signed)
BP better at home.  SBP 144.  With weight loss, continue present medication and recheck in 6 months

## 2016-10-24 NOTE — Progress Notes (Signed)
BP (!) 144/84 (BP Location: Left Arm, Cuff Size: Large)   Pulse 76   Temp 98 F (36.7 C)   Ht 5' 5.6" (1.666 m)   Wt 219 lb 3.2 oz (99.4 kg)   LMP 12/15/1991 (Approximate)   SpO2 98%   BMI 35.81 kg/m    Subjective:    Patient ID: Tiffany Delacruz, female    DOB: 12/24/1950, 66 y.o.   MRN: [id number]  HPI: Tiffany Delacruz is a 66 y.o. female  Chief Complaint  Patient presents with  . Medicare Wellness   Hypertension Using medications without difficulty Average home BPs 132/72  No problems or lightheadedness No chest pain with exertion or shortness of breath No Edema  Depression Doing well Depression screen [facility] 2/9 10/24/2016 04/18/2016 10/20/2015 04/16/2015  Decreased Interest 0 0 0 0  Down, Depressed, Hopeless 0 0 0 0  PHQ - 2 Score 0 0 0 0  Altered sleeping 0 - - -  Tired, decreased energy 0 - - -  Change in appetite 0 - - -  Feeling bad or failure about yourself  0 - - -  Trouble concentrating 0 - - -  Moving slowly or fidgety/restless 0 - - -  Suicidal thoughts 0 - - -  PHQ-9 Score 0 - - -     Obesity Recent weight loss through [employer] and walking  Hypothyroid Tired, losing hair and thick fingernails.  Taking thyroid meds regularly  Functional Status Survey: Is the patient deaf or have difficulty hearing?: No Does the patient have difficulty seeing, even when wearing glasses/contacts?: Yes Does the patient have difficulty concentrating, remembering, or making decisions?: Yes Does the patient have difficulty walking or climbing stairs?: No Does the patient have difficulty dressing or bathing?: No Does the patient have difficulty doing errands alone such as visiting a doctor's office or shopping?: No Fall Risk  10/24/2016 10/20/2015  Falls in the past year? No No   Mini cog is negative.    Relevant past medical, surgical, family and social history reviewed and updated as indicated. Interim medical history since our last visit reviewed. Allergies and  medications reviewed and updated.  Review of Systems  All other systems reviewed and are negative.   Per HPI unless specifically indicated above     Objective:    BP (!) 144/84 (BP Location: Left Arm, Cuff Size: Large)   Pulse 76   Temp 98 F (36.7 C)   Ht 5' 5.6" (1.666 m)   Wt 219 lb 3.2 oz (99.4 kg)   LMP 12/15/1991 (Approximate)   SpO2 98%   BMI 35.81 kg/m   Wt Readings from Last 3 Encounters:  10/24/16 219 lb 3.2 oz (99.4 kg)  04/18/16 227 lb 12.8 oz (103.3 kg)  11/12/15 230 lb 6.4 oz (104.5 kg)    Physical Exam  Constitutional: She is oriented to person, place, and time. She appears well-developed and well-nourished.  HENT:  Head: Normocephalic and atraumatic.  Eyes: Pupils are equal, round, and reactive to light. Right eye exhibits no discharge. Left eye exhibits no discharge. No scleral icterus.  Neck: Normal range of motion. Neck supple. Carotid bruit is not present. No thyromegaly present.  Cardiovascular: Normal rate, regular rhythm and normal heart sounds.  Exam reveals no gallop and no friction rub.   No murmur heard. Pulmonary/Chest: Effort normal and breath sounds normal. No respiratory distress. She has no wheezes. She has no rales.  Abdominal: Soft. Bowel sounds are normal. There is no tenderness. There  is no rebound.  Genitourinary: No breast swelling, tenderness or discharge.  Musculoskeletal: Normal range of motion.  Lymphadenopathy:    She has no cervical adenopathy.  Neurological: She is alert and oriented to person, place, and time.  Skin: Skin is warm, dry and intact. No rash noted.  Psychiatric: She has a normal mood and affect. Her speech is normal and behavior is normal. Judgment and thought content normal. Cognition and memory are normal.   Mini cog is negative.    Results for orders placed or performed in visit on 10/20/15  Hepatitis C antibody  Result Value Ref Range   Hep C Virus Ab <0.1 0.0 - 0.9 s/co ratio  TSH  Result Value Ref  Range   TSH 4.480 0.450 - 4.500 uIU/mL  Comprehensive metabolic panel  Result Value Ref Range   Glucose 98 65 - 99 mg/dL   BUN 16 8 - 27 mg/dL   Creatinine, Ser 0.85 0.57 - 1.00 mg/dL   GFR calc non Af Amer 72 >59 mL/min/1.73   GFR calc Af Amer 83 >59 mL/min/1.73   BUN/Creatinine Ratio 19 11 - 26   Sodium 140 134 - 144 mmol/L   Potassium 4.0 3.5 - 5.2 mmol/L   Chloride 99 96 - 106 mmol/L   CO2 24 18 - 29 mmol/L   Calcium 9.2 8.7 - 10.3 mg/dL   Total Protein 6.5 6.0 - 8.5 g/dL   Albumin 4.1 3.6 - 4.8 g/dL   Globulin, Total 2.4 1.5 - 4.5 g/dL   Albumin/Globulin Ratio 1.7 1.1 - 2.5   Bilirubin Total 0.8 0.0 - 1.2 mg/dL   Alkaline Phosphatase 70 39 - 117 IU/L   AST 13 0 - 40 IU/L   ALT 8 0 - 32 IU/L  Bayer DCA Hb A1c Waived  Result Value Ref Range   Bayer DCA Hb A1c Waived 5.3 <7.0 %      Assessment & Plan:   Problem List Items Addressed This Visit      Unprioritized   Depression    Stable, continue present medications.        Hyperlipidemia    Not checking as refusing statins      Hypertension    BP better at home.  SBP 144.  With weight loss, continue present medication and recheck in 6 months      Relevant Orders   Comprehensive metabolic panel   Hypothyroidism    Check TSH      Relevant Orders   TSH    Other Visit Diagnoses    Annual physical exam    -  Primary       Refusing mammogram and dexascan  Follow up plan: Return in about 6 months (around 04/23/2017).

## 2016-10-24 NOTE — Assessment & Plan Note (Signed)
Stable, continue present medications.   

## 2016-10-25 ENCOUNTER — Encounter: Payer: Self-pay | Admitting: Unknown Physician Specialty

## 2016-10-25 LAB — COMPREHENSIVE METABOLIC PANEL
A/G RATIO: 1.6 (ref 1.2–2.2)
ALBUMIN: 4.1 g/dL (ref 3.6–4.8)
ALT: 8 IU/L (ref 0–32)
AST: 10 IU/L (ref 0–40)
Alkaline Phosphatase: 65 IU/L (ref 39–117)
BILIRUBIN TOTAL: 0.6 mg/dL (ref 0.0–1.2)
BUN / CREAT RATIO: 23 (ref 12–28)
BUN: 19 mg/dL (ref 8–27)
CHLORIDE: 101 mmol/L (ref 96–106)
CO2: 24 mmol/L (ref 18–29)
Calcium: 9.3 mg/dL (ref 8.7–10.3)
Creatinine, Ser: 0.83 mg/dL (ref 0.57–1.00)
GFR calc non Af Amer: 73 mL/min/{1.73_m2} (ref >59)
GFR, EST AFRICAN AMERICAN: 84 mL/min/{1.73_m2} (ref >59)
GLUCOSE: 104 mg/dL — AB (ref 65–99)
Globulin, Total: 2.5 g/dL (ref 1.5–4.5)
Potassium: 4.4 mmol/L (ref 3.5–5.2)
Sodium: 141 mmol/L (ref 134–144)
TOTAL PROTEIN: 6.6 g/dL (ref 6.0–8.5)

## 2016-10-25 LAB — TSH: TSH: 0.272 u[IU]/mL — ABNORMAL LOW (ref 0.450–4.500)

## 2017-01-14 ENCOUNTER — Other Ambulatory Visit: Payer: Self-pay | Admitting: Unknown Physician Specialty

## 2017-01-23 ENCOUNTER — Other Ambulatory Visit: Payer: Self-pay | Admitting: Unknown Physician Specialty

## 2017-01-26 ENCOUNTER — Other Ambulatory Visit: Payer: Self-pay | Admitting: Unknown Physician Specialty

## 2017-01-26 NOTE — Telephone Encounter (Signed)
Letter generated and sent to patient.  

## 2017-01-26 NOTE — Telephone Encounter (Signed)
Needs labs

## 2017-04-17 ENCOUNTER — Other Ambulatory Visit: Payer: Medicare Other

## 2017-04-17 ENCOUNTER — Other Ambulatory Visit: Payer: Self-pay

## 2017-04-17 DIAGNOSIS — E039 Hypothyroidism, unspecified: Secondary | ICD-10-CM

## 2017-04-18 ENCOUNTER — Encounter: Payer: Self-pay | Admitting: Unknown Physician Specialty

## 2017-04-18 LAB — TSH: TSH: 0.514 u[IU]/mL (ref 0.450–4.500)

## 2017-04-19 ENCOUNTER — Other Ambulatory Visit: Payer: Self-pay | Admitting: Unknown Physician Specialty

## 2017-04-20 ENCOUNTER — Telehealth: Payer: Self-pay | Admitting: Unknown Physician Specialty

## 2017-04-20 NOTE — Telephone Encounter (Signed)
No I didn't.  But you can let her know they were normal

## 2017-04-20 NOTE — Telephone Encounter (Signed)
Patient states she missed a call from here and was thinking it may be regarding her thyroid lab.  Thanks  [phone]

## 2017-04-20 NOTE — Telephone Encounter (Signed)
I did not call this patient, Tiffany Delacruz did you?

## 2017-04-25 ENCOUNTER — Ambulatory Visit (INDEPENDENT_AMBULATORY_CARE_PROVIDER_SITE_OTHER): Payer: Medicare Other | Admitting: Unknown Physician Specialty

## 2017-04-25 ENCOUNTER — Encounter: Payer: Self-pay | Admitting: Unknown Physician Specialty

## 2017-04-25 DIAGNOSIS — I10 Essential (primary) hypertension: Secondary | ICD-10-CM

## 2017-04-25 DIAGNOSIS — E039 Hypothyroidism, unspecified: Secondary | ICD-10-CM

## 2017-04-25 DIAGNOSIS — F3341 Major depressive disorder, recurrent, in partial remission: Secondary | ICD-10-CM

## 2017-04-25 NOTE — Progress Notes (Signed)
BP 127/82   Pulse 85   Temp 98.3 F (36.8 C)   Ht 5' 6.1" (1.679 m)   Wt 221 lb 3.2 oz (100.3 kg)   LMP 12/15/1991 (Approximate)   SpO2 97%   BMI 35.59 kg/m    Subjective:    Patient ID: Tiffany Delacruz, female    DOB: 1951-02-18, 66 y.o.   MRN: [id number]  HPI: Tiffany Delacruz is a 66 y.o. female  Chief Complaint  Patient presents with  . Depression  . Hypertension  . Hypothyroidism   Hypertension Using medications without difficulty Average home BPs Not checking   No problems or lightheadedness No chest pain with exertion or shortness of breath No Edema  Hyperlipidemia Refuses statins  Hypothyroid Normal TSH 1 week ago  Depression History of but not taking Fluoxetine Depression screen [facility] 2/9 04/25/2017 10/24/2016 04/18/2016 10/20/2015 04/16/2015  Decreased Interest 0 0 0 0 0  Down, Depressed, Hopeless 0 0 0 0 0  PHQ - 2 Score 0 0 0 0 0  Altered sleeping 0 0 - - -  Tired, decreased energy 0 0 - - -  Change in appetite 0 0 - - -  Feeling bad or failure about yourself  0 0 - - -  Trouble concentrating 0 0 - - -  Moving slowly or fidgety/restless 0 0 - - -  Suicidal thoughts 0 0 - - -  PHQ-9 Score 0 0 - - -     Relevant past medical, surgical, family and social history reviewed and updated as indicated. Interim medical history since our last visit reviewed. Allergies and medications reviewed and updated.  Review of Systems  Per HPI unless specifically indicated above     Objective:    BP 127/82   Pulse 85   Temp 98.3 F (36.8 C)   Ht 5' 6.1" (1.679 m)   Wt 221 lb 3.2 oz (100.3 kg)   LMP 12/15/1991 (Approximate)   SpO2 97%   BMI 35.59 kg/m   Wt Readings from Last 3 Encounters:  04/25/17 221 lb 3.2 oz (100.3 kg)  10/24/16 219 lb 3.2 oz (99.4 kg)  04/18/16 227 lb 12.8 oz (103.3 kg)    Physical Exam  Constitutional: She is oriented to person, place, and time. She appears well-developed and well-nourished. No distress.  HENT:  Head: Normocephalic  and atraumatic.  Eyes: Conjunctivae and lids are normal. Right eye exhibits no discharge. Left eye exhibits no discharge. No scleral icterus.  Neck: Normal range of motion. Neck supple. No JVD present. Carotid bruit is not present.  Cardiovascular: Normal rate, regular rhythm and normal heart sounds.   Pulmonary/Chest: Effort normal and breath sounds normal.  Abdominal: Normal appearance. There is no splenomegaly or hepatomegaly.  Musculoskeletal: Normal range of motion.  Neurological: She is alert and oriented to person, place, and time.  Skin: Skin is warm, dry and intact. No rash noted. No pallor.  Psychiatric: She has a normal mood and affect. Her behavior is normal. Judgment and thought content normal.    Results for orders placed or performed in visit on 04/17/17  TSH  Result Value Ref Range   TSH 0.514 0.450 - 4.500 uIU/mL      Assessment & Plan:   Problem List Items Addressed This Visit      Unprioritized   Depression    Off of Fluoxetine.  Doing well      Hypertension    Stable, continue present medications.  Hypothyroidism    Stable labs           Follow up plan: Return in about 6 months (around 10/26/2017).

## 2017-04-25 NOTE — Assessment & Plan Note (Signed)
Stable labs

## 2017-04-25 NOTE — Assessment & Plan Note (Signed)
Stable, continue present medications.   

## 2017-04-25 NOTE — Assessment & Plan Note (Signed)
Off of Fluoxetine.  Doing well

## 2017-07-17 ENCOUNTER — Encounter
Admission: RE | Admit: 2017-07-17 | Discharge: 2017-07-17 | Disposition: A | Discharge status: Home / Self Care | Payer: Medicare Other | Source: Ambulatory Visit | Attending: Orthopedic Surgery | Admitting: Orthopedic Surgery

## 2017-07-17 ENCOUNTER — Other Ambulatory Visit: Payer: Self-pay | Admitting: Unknown Physician Specialty

## 2017-07-17 HISTORY — DX: Hypothyroidism, unspecified: E03.9

## 2017-07-17 HISTORY — DX: Sleep apnea, unspecified: G47.30

## 2017-07-17 NOTE — Patient Instructions (Signed)
Your procedure is scheduled on: 07-20-17 FRIDAY Report to Same Day Surgery 2nd floor medical mall [facility] Entrance-take elevator on left to 2nd floor.  Check in with surgery information desk.) To find out your arrival time please call [phone] between 1PM - 3PM on 07-19-17 THURSDAY  Remember: Instructions that are not followed completely may result in serious medical risk, up to and including death, or upon the discretion of your surgeon and anesthesiologist your surgery may need to be rescheduled.    _x___ 1. Do not eat food after midnight the night before your procedure. NO GUM CHEWING OR HARD CANDIES.  You may drink clear liquids up to 2 hours before you are scheduled to arrive at the hospital for your procedure.  Do not drink clear liquids within 2 hours of your scheduled arrival to the hospital.  Clear liquids include  --Water or Apple juice without pulp  --Clear carbohydrate beverage such as ClearFast or Gatorade  --Black Coffee or Clear Tea (No milk, no creamers, do not add anything to the coffee or Tea)  Type 1 and type 2 diabetics should only drink water.     __x__ 2. No Alcohol for 24 hours before or after surgery.   __x__3. No Smoking for 24 prior to surgery.   ____  4. Bring all medications with you on the day of surgery if instructed.    __x__ 5. Notify your doctor if there is any change in your medical condition     (cold, fever, infections).     Do not wear jewelry, make-up, hairpins, clips or nail polish.  Do not wear lotions, powders, or perfumes. You may wear deodorant.  Do not shave 48 hours prior to surgery. Men may shave face and neck.  Do not bring valuables to the hospital.    [facility] is not responsible for any belongings or valuables.               Contacts, dentures or bridgework may not be worn into surgery.  Leave your suitcase in the car. After surgery it may be brought to your room.  For patients admitted to the hospital, discharge time  is determined by your treatment team.   Patients discharged the day of surgery will not be allowed to drive home.  You will need someone to drive you home and stay with you the night of your procedure.    Please read over the following fact sheets that you were given:   [facility] Preparing for Surgery and or MRSA Information   _x___ TAKE THE FOLLOWING MEDICATIONS THE MORNING OF SURGERY WITH A SMALL SIP OF WATER. These include:  1. LEVOTHYROXINE  2. PEPCID  3. TAKE AN EXTRA PEPCID Thursday NIGHT BEFORE BED  4.  5.  6.  ____Fleets enema or Magnesium Citrate as directed.   _x___ Use CHG Soap or sage wipes as directed on instruction sheet   ____ Use inhalers on the day of surgery and bring to hospital day of surgery  ____ Stop Metformin and Janumet 2 days prior to surgery.    ____ Take 1/2 of usual insulin dose the night before surgery and none on the morning surgery.   ____ Follow recommendations from Cardiologist, Pulmonologist or PCP regarding stopping Aspirin, Coumadin, Plavix ,Eliquis, Effient, or Pradaxa, and Pletal.  X____Stop Anti-inflammatories such as Advil, Aleve, Ibuprofen, Motrin, Naproxen, Naprosyn, Goodies powders or aspirin products NOW-OK to take Tylenol    ____ Stop supplements until after surgery.  ____ Bring C-Pap to the hospital.

## 2017-07-18 ENCOUNTER — Other Ambulatory Visit: Payer: Self-pay | Admitting: Unknown Physician Specialty

## 2017-07-18 ENCOUNTER — Encounter
Admission: RE | Admit: 2017-07-18 | Discharge: 2017-07-18 | Disposition: A | Discharge status: Home / Self Care | Payer: Medicare Other | Source: Ambulatory Visit | Attending: Orthopedic Surgery | Admitting: Orthopedic Surgery

## 2017-07-18 DIAGNOSIS — I10 Essential (primary) hypertension: Secondary | ICD-10-CM | POA: Diagnosis not present

## 2017-07-18 DIAGNOSIS — Z79899 Other long term (current) drug therapy: Secondary | ICD-10-CM | POA: Diagnosis not present

## 2017-07-18 DIAGNOSIS — K219 Gastro-esophageal reflux disease without esophagitis: Secondary | ICD-10-CM | POA: Diagnosis not present

## 2017-07-18 DIAGNOSIS — Z800 Family history of malignant neoplasm of digestive organs: Secondary | ICD-10-CM | POA: Diagnosis not present

## 2017-07-18 DIAGNOSIS — Z96651 Presence of right artificial knee joint: Secondary | ICD-10-CM | POA: Diagnosis not present

## 2017-07-18 DIAGNOSIS — Z7951 Long term (current) use of inhaled steroids: Secondary | ICD-10-CM | POA: Diagnosis not present

## 2017-07-18 DIAGNOSIS — G5602 Carpal tunnel syndrome, left upper limb: Secondary | ICD-10-CM | POA: Diagnosis not present

## 2017-07-18 DIAGNOSIS — Z8261 Family history of arthritis: Secondary | ICD-10-CM | POA: Diagnosis not present

## 2017-07-18 DIAGNOSIS — G4730 Sleep apnea, unspecified: Secondary | ICD-10-CM | POA: Diagnosis not present

## 2017-07-20 ENCOUNTER — Encounter: Payer: Self-pay | Admitting: Orthopedic Surgery

## 2017-07-20 ENCOUNTER — Ambulatory Visit: Payer: Medicare Other | Admitting: Registered Nurse

## 2017-07-20 ENCOUNTER — Encounter
Admission: RE | Disposition: A | Discharge status: Home / Self Care | Payer: Self-pay | Source: Ambulatory Visit | Attending: Orthopedic Surgery

## 2017-07-20 ENCOUNTER — Ambulatory Visit
Admission: RE | Admit: 2017-07-20 | Discharge: 2017-07-20 | Disposition: A | Discharge status: Home / Self Care | Payer: Medicare Other | Source: Ambulatory Visit | Attending: Orthopedic Surgery | Admitting: Orthopedic Surgery

## 2017-07-20 DIAGNOSIS — I10 Essential (primary) hypertension: Secondary | ICD-10-CM | POA: Insufficient documentation

## 2017-07-20 DIAGNOSIS — Z79899 Other long term (current) drug therapy: Secondary | ICD-10-CM | POA: Insufficient documentation

## 2017-07-20 DIAGNOSIS — Z7951 Long term (current) use of inhaled steroids: Secondary | ICD-10-CM | POA: Insufficient documentation

## 2017-07-20 DIAGNOSIS — G4730 Sleep apnea, unspecified: Secondary | ICD-10-CM | POA: Insufficient documentation

## 2017-07-20 DIAGNOSIS — Z96651 Presence of right artificial knee joint: Secondary | ICD-10-CM | POA: Insufficient documentation

## 2017-07-20 DIAGNOSIS — K219 Gastro-esophageal reflux disease without esophagitis: Secondary | ICD-10-CM | POA: Insufficient documentation

## 2017-07-20 DIAGNOSIS — Z8261 Family history of arthritis: Secondary | ICD-10-CM | POA: Insufficient documentation

## 2017-07-20 DIAGNOSIS — G5602 Carpal tunnel syndrome, left upper limb: Secondary | ICD-10-CM | POA: Diagnosis not present

## 2017-07-20 DIAGNOSIS — Z800 Family history of malignant neoplasm of digestive organs: Secondary | ICD-10-CM | POA: Insufficient documentation

## 2017-07-20 HISTORY — PX: CARPAL TUNNEL RELEASE: SHX101

## 2017-07-20 SURGERY — CARPAL TUNNEL RELEASE
Anesthesia: General | Laterality: Left | Wound class: Clean

## 2017-07-20 MED ORDER — FENTANYL CITRATE (PF) 100 MCG/2ML IJ SOLN
INTRAMUSCULAR | Status: AC
  Filled 2017-07-20: qty 2

## 2017-07-20 MED ORDER — ONDANSETRON HCL 4 MG/2ML IJ SOLN: 4.0000 mg | Freq: Once | INTRAMUSCULAR | Status: DC | PRN

## 2017-07-20 MED ORDER — HYDROCODONE-ACETAMINOPHEN 5-325 MG PO TABS: 1.0000 | ORAL_TABLET | ORAL | 0 refills | Status: DC | PRN

## 2017-07-20 MED ORDER — PROPOFOL 10 MG/ML IV BOLUS
INTRAVENOUS | Status: AC
  Filled 2017-07-20: qty 20

## 2017-07-20 MED ORDER — FAMOTIDINE 20 MG PO TABS
ORAL_TABLET | ORAL | Status: AC
  Administered 2017-07-20: 20 mg
  Filled 2017-07-20: qty 1

## 2017-07-20 MED ORDER — ONDANSETRON HCL 4 MG PO TABS: 4.0000 mg | ORAL_TABLET | Freq: Four times a day (QID) | ORAL | Status: DC | PRN

## 2017-07-20 MED ORDER — BUPIVACAINE HCL (PF) 0.25 % IJ SOLN
INTRAMUSCULAR | Status: DC | PRN
  Administered 2017-07-20: 10 mL

## 2017-07-20 MED ORDER — LIDOCAINE HCL (PF) 2 % IJ SOLN
INTRAMUSCULAR | Status: AC
  Filled 2017-07-20: qty 10

## 2017-07-20 MED ORDER — MIDAZOLAM HCL 2 MG/2ML IJ SOLN
INTRAMUSCULAR | Status: DC | PRN
Start: 2017-07-20 — End: 2017-07-20
  Administered 2017-07-20: 2 mg via INTRAVENOUS

## 2017-07-20 MED ORDER — DEXAMETHASONE SODIUM PHOSPHATE 10 MG/ML IJ SOLN
INTRAMUSCULAR | Status: AC
  Filled 2017-07-20: qty 1

## 2017-07-20 MED ORDER — DEXAMETHASONE SODIUM PHOSPHATE 10 MG/ML IJ SOLN
INTRAMUSCULAR | Status: DC | PRN
  Administered 2017-07-20: 10 mg via INTRAVENOUS

## 2017-07-20 MED ORDER — FENTANYL CITRATE (PF) 100 MCG/2ML IJ SOLN
INTRAMUSCULAR | Status: DC | PRN
  Administered 2017-07-20 (×2): 25 ug via INTRAVENOUS
  Administered 2017-07-20: 50 ug via INTRAVENOUS

## 2017-07-20 MED ORDER — CEFAZOLIN SODIUM 1 G IJ SOLR
INTRAMUSCULAR | Status: AC
  Filled 2017-07-20: qty 20

## 2017-07-20 MED ORDER — ONDANSETRON HCL 4 MG/2ML IJ SOLN: 4.0000 mg | Freq: Four times a day (QID) | INTRAMUSCULAR | Status: DC | PRN

## 2017-07-20 MED ORDER — METOCLOPRAMIDE HCL 5 MG/ML IJ SOLN
5.0000 mg | Freq: Three times a day (TID) | INTRAMUSCULAR | Status: DC | PRN
Start: 2017-07-20 — End: 2017-07-20

## 2017-07-20 MED ORDER — BUPIVACAINE HCL (PF) 0.25 % IJ SOLN
INTRAMUSCULAR | Status: AC
  Filled 2017-07-20: qty 10

## 2017-07-20 MED ORDER — MIDAZOLAM HCL 2 MG/2ML IJ SOLN
INTRAMUSCULAR | Status: AC
  Filled 2017-07-20: qty 2

## 2017-07-20 MED ORDER — LIDOCAINE HCL (CARDIAC) 20 MG/ML IV SOLN
INTRAVENOUS | Status: DC | PRN
  Administered 2017-07-20: 50 mg via INTRAVENOUS

## 2017-07-20 MED ORDER — PROPOFOL 10 MG/ML IV BOLUS
INTRAVENOUS | Status: DC | PRN
Start: 2017-07-20 — End: 2017-07-20
  Administered 2017-07-20: 130 mg via INTRAVENOUS
  Administered 2017-07-20: 30 mg via INTRAVENOUS

## 2017-07-20 MED ORDER — METOCLOPRAMIDE HCL 10 MG PO TABS: 5.0000 mg | ORAL_TABLET | Freq: Three times a day (TID) | ORAL | Status: DC | PRN

## 2017-07-20 MED ORDER — FENTANYL CITRATE (PF) 100 MCG/2ML IJ SOLN: 25.0000 ug | INTRAMUSCULAR | Status: DC | PRN

## 2017-07-20 MED ORDER — ONDANSETRON HCL 4 MG/2ML IJ SOLN
INTRAMUSCULAR | Status: DC | PRN
  Administered 2017-07-20: 4 mg via INTRAVENOUS

## 2017-07-20 MED ORDER — EPHEDRINE SULFATE 50 MG/ML IJ SOLN
INTRAMUSCULAR | Status: DC | PRN
  Administered 2017-07-20: 10 mg via INTRAVENOUS

## 2017-07-20 MED ORDER — EPHEDRINE SULFATE 50 MG/ML IJ SOLN
INTRAMUSCULAR | Status: AC
  Filled 2017-07-20: qty 1

## 2017-07-20 MED ORDER — NEOMYCIN-POLYMYXIN B GU 40-200000 IR SOLN
Status: DC | PRN
  Administered 2017-07-20: 2 mL

## 2017-07-20 MED ORDER — PHENYLEPHRINE HCL 10 MG/ML IJ SOLN
INTRAMUSCULAR | Status: DC | PRN
Start: 2017-07-20 — End: 2017-07-20
  Administered 2017-07-20 (×2): 100 ug via INTRAVENOUS

## 2017-07-20 MED ORDER — LACTATED RINGERS IV SOLN
INTRAVENOUS | Status: DC
  Administered 2017-07-20 (×2): via INTRAVENOUS

## 2017-07-20 MED ORDER — CEFAZOLIN SODIUM-DEXTROSE 2-3 GM-%(50ML) IV SOLR
INTRAVENOUS | Status: DC | PRN
  Administered 2017-07-20: 2 g via INTRAVENOUS

## 2017-07-20 MED ORDER — CHLORHEXIDINE GLUCONATE 4 % EX LIQD: 60.0000 mL | Freq: Once | CUTANEOUS | Status: DC

## 2017-07-20 MED ORDER — ONDANSETRON HCL 4 MG/2ML IJ SOLN
INTRAMUSCULAR | Status: AC
  Filled 2017-07-20: qty 2

## 2017-07-20 MED ORDER — NEOMYCIN-POLYMYXIN B GU 40-200000 IR SOLN
Status: AC
  Filled 2017-07-20: qty 2

## 2017-07-20 SURGICAL SUPPLY — 26 items
BANDAGE ELASTIC 3 LF NS (GAUZE/BANDAGES/DRESSINGS) ×3 IMPLANT
BNDG ESMARK 4X12 TAN STRL LF (GAUZE/BANDAGES/DRESSINGS) ×3 IMPLANT
CANISTER SUCT 1200ML W/VALVE (MISCELLANEOUS) ×3 IMPLANT
CAST PADDING 3X4FT ST 30246 (SOFTGOODS) ×2
COVER LIGHT HANDLE STERIS (MISCELLANEOUS) ×3 IMPLANT
CUFF TOURN 18 STER (MISCELLANEOUS) ×3 IMPLANT
DRSG DERMACEA 8X12 NADH (GAUZE/BANDAGES/DRESSINGS) ×3 IMPLANT
DURAPREP 26ML APPLICATOR (WOUND CARE) ×3 IMPLANT
ELECT CAUTERY BLADE 6.4 (BLADE) ×3 IMPLANT
ELECT REM PT RETURN 9FT ADLT (ELECTROSURGICAL) ×3
ELECTRODE REM PT RTRN 9FT ADLT (ELECTROSURGICAL) ×1 IMPLANT
GAUZE SPONGE 4X4 12PLY STRL (GAUZE/BANDAGES/DRESSINGS) ×3 IMPLANT
GLOVE BIOGEL M STRL SZ7.5 (GLOVE) ×6 IMPLANT
GLOVE INDICATOR 8.0 STRL GRN (GLOVE) ×6 IMPLANT
GOWN STRL REUS W/ TWL LRG LVL3 (GOWN DISPOSABLE) ×2 IMPLANT
GOWN STRL REUS W/TWL LRG LVL3 (GOWN DISPOSABLE) ×4
KIT RM TURNOVER STRD PROC AR (KITS) ×3 IMPLANT
NS IRRIG 500ML POUR BTL (IV SOLUTION) ×3 IMPLANT
PACK EXTREMITY ARMC (MISCELLANEOUS) ×3 IMPLANT
PAD CAST CTTN 3X4 STRL (SOFTGOODS) ×1 IMPLANT
SOL PREP PVP 2OZ (MISCELLANEOUS) ×3
SOLUTION PREP PVP 2OZ (MISCELLANEOUS) ×1 IMPLANT
SPLINT CAST 1 STEP 3X12 (MISCELLANEOUS) ×3 IMPLANT
STOCKINETTE STRL 4IN 9604848 (GAUZE/BANDAGES/DRESSINGS) ×3 IMPLANT
SUT ETHILON 5-0 FS-2 18 BLK (SUTURE) ×3 IMPLANT
SYR 3ML 18GX1 1/2 (SYRINGE) ×3 IMPLANT

## 2017-07-20 NOTE — Transfer of Care (Signed)
Immediate Anesthesia Transfer of Care Note  Patient: Tiffany Delacruz  Procedure(s) Performed: CARPAL TUNNEL RELEASE (Left )  Patient Location: PACU  Anesthesia Type:General  Level of Consciousness: drowsy and patient cooperative  Airway & Oxygen Therapy: Patient Spontanous Breathing and Patient connected to face mask oxygen  Post-op Assessment: Report given to RN, Post -op Vital signs reviewed and stable and Patient moving all extremities X 4  Post vital signs: Reviewed and stable  Last Vitals:  Vitals:   07/20/17 0814  BP: (!) 141/78  Pulse: 83  Resp: 19  Temp: 36.4 C  SpO2: 97%    Last Pain:  Vitals:   07/20/17 0814  TempSrc: Oral         Complications: No apparent anesthesia complications

## 2017-07-20 NOTE — Progress Notes (Signed)
Ice to left wrist  Can wiggle fingers on left   Capillary refill positive to left upper extremity

## 2017-07-20 NOTE — Discharge Instructions (Addendum)
°  Instructions after Hand / Wrist Surgery ° ° James P. Hooten, Jr., M.D. ° Dept. of Orthopaedics & Sports Medicine ° [facility] ° [address] ° [place], [place]  [address] ° ° Phone: [phone]   Fax: [phone] ° ° °DIET: °• Drink plenty of non-alcoholic fluids & begin a light diet. °• Resume your normal diet the day after surgery. ° °ACTIVITY:  °• Keep the hand elevated above the level of the elbow. °• Begin gently moving the fingers on a regular basis to avoid stiffness. °• Avoid any heavy lifting, pushing, or pulling with the operative hand. °• Do not drive or operate any equipment until instructed. ° °WOUND CARE:  °• Keep the splint/bandage clean and dry.  °• The splint and stitches will be removed in the office. °• Continue to use the ice packs periodically to reduce pain and swelling. °• You may bathe or shower after the stitches are removed at the first office visit following surgery. ° °MEDICATIONS: °• You may resume your regular medications. °• Please take the pain medication as prescribed. °• Do not take pain medication on an empty stomach. °• Do not drive or drink alcoholic beverages when taking pain medications. ° °CALL THE OFFICE FOR: °• Temperature above 101 degrees °• Excessive bleeding or drainage on the dressing. °• Excessive swelling, coldness, or paleness of the fingers. °• Persistent nausea and vomiting. ° °FOLLOW-UP:  °• You should have an appointment to return to the office in 7-10 days after surgery.  ° °REMEMBER: R.I.C.E. = Rest, Ice, Compression, Elevation !  ° ° ° °AMBULATORY SURGERY  °DISCHARGE INSTRUCTIONS ° ° °1) The drugs that you were given will stay in your system until tomorrow so for the next 24 hours you should not: ° °A) Drive an automobile °B) Make any legal decisions °C) Drink any alcoholic beverage ° ° °2) You may resume regular meals tomorrow.  Today it is better to start with liquids and gradually work up to solid foods. ° °You may eat anything you prefer, but  it is better to start with liquids, then soup and crackers, and gradually work up to solid foods. ° ° °3) Please notify your doctor immediately if you have any unusual bleeding, trouble breathing, redness and pain at the surgery site, drainage, fever, or pain not relieved by medication. ° ° ° °4) Additional Instructions: ° ° ° ° ° ° ° °Please contact your physician with any problems or Same Day Surgery at [phone], Monday through Friday 6 am to 4 pm, or [place] [facility] [place] Main number at [phone]. °

## 2017-07-20 NOTE — Op Note (Signed)
OPERATIVE NOTE  DATE OF SURGERY:  07/20/2017  PATIENT NAME:  Tiffany Delacruz   DOB: 05/16/1951  MRN: [id number]  PRE-OPERATIVE DIAGNOSIS: Left carpal tunnel syndrome  POST-OPERATIVE DIAGNOSIS:  Same  PROCEDURE:  Left carpal tunnel release  SURGEON:  Marciano Sequin. M.D.  ANESTHESIA: general  ESTIMATED BLOOD LOSS: Minimal  FLUIDS REPLACED: 650 mL of crystalloid  TOURNIQUET TIME: 33 minutes  DRAINS: None  INDICATIONS FOR SURGERY: Tiffany Delacruz is a 65 y.o. year old female with a long history of numbness and paresthesias to the left hand. EMG/nerve conduction studies demonstrated findings consistent with carpal tunnel syndrome.The patient had not seen any significant improvement despite conservative nonsurgical intervention. After discussion of the risks and benefits of surgical intervention, the patient expressed understanding of the risks benefits and agree with plans for carpal tunnel release.   PROCEDURE IN DETAIL: The patient was brought into the operating room and after adequate general anesthesia, a tourniquet was placed on the patient's left upper arm.The left hand and arm were prepped with alcohol and Duraprep and draped in the usual sterile fashion. A "time-out" was performed as per usual protocol. The hand and forearm were exsanguinated using an Esmarch and the tourniquet was inflated to 250 mmHg. Loupe magnification was used throughout the procedure. An incision was made just ulnar to the thenar palmar crease. Dissection was carried down through the palmar fascia to the transverse carpal ligament. The transverse carpal ligament was sharply incised, taking care to protect the underlying structures with the carpal tunnel. Complete release of the transverse carpal ligament was achieved. There was no evidence of ganglion cyst or lipoma within the carpal tunnel. The wound was irrigated with copious amounts of normal saline with antibiotic solution. The skin was then re-approximated  with interrupted sutures of #5-0 nylon. A sterile dressing was applied followed by application of a volar splint. The tourniquet was deflated with a total tourniquet time of 33 minutes.  The patient tolerated the procedure well and was transported to the PACU in stable condition.  Malva Diesing P. Holley Bouche., M.D.

## 2017-07-20 NOTE — Anesthesia Post-op Follow-up Note (Signed)
Anesthesia QCDR form completed.        

## 2017-07-20 NOTE — Anesthesia Preprocedure Evaluation (Signed)
Anesthesia Evaluation  Patient identified by MRN, date of birth, ID band Patient awake    Reviewed: Allergy & Precautions, H&P , NPO status , Patient's Chart, lab work & pertinent test results, reviewed documented beta blocker date and time   Airway Mallampati: II  TM Distance: >3 FB Neck ROM: full    Dental  (+) Teeth Intact   Pulmonary neg pulmonary ROS, sleep apnea and Continuous Positive Airway Pressure Ventilation ,    Pulmonary exam normal        Cardiovascular Exercise Tolerance: Poor hypertension, On Medications negative cardio ROS Normal cardiovascular exam Rate:Normal     Neuro/Psych PSYCHIATRIC DISORDERS negative neurological ROS  negative psych ROS   GI/Hepatic negative GI ROS, Neg liver ROS, GERD  Medicated,  Endo/Other  negative endocrine ROSHypothyroidism   Renal/GU negative Renal ROS  negative genitourinary   Musculoskeletal   Abdominal   Peds  Hematology negative hematology ROS (+)   Anesthesia Other Findings   Reproductive/Obstetrics negative OB ROS                             Anesthesia Physical Anesthesia Plan  ASA: III  Anesthesia Plan: General LMA   Post-op Pain Management:    Induction:   PONV Risk Score and Plan: 4 or greater and Ondansetron, Dexamethasone, Midazolam and Propofol infusion  Airway Management Planned:   Additional Equipment:   Intra-op Plan:   Post-operative Plan:   Informed Consent: I have reviewed the patients History and Physical, chart, labs and discussed the procedure including the risks, benefits and alternatives for the proposed anesthesia with the patient or authorized representative who has indicated his/her understanding and acceptance.     Plan Discussed with: CRNA  Anesthesia Plan Comments:         Anesthesia Quick Evaluation

## 2017-07-20 NOTE — H&P (Signed)
The patient has been re-examined, and the chart reviewed, and there have been no interval changes to the documented history and physical.    The risks, benefits, and alternatives have been discussed at length. The patient expressed understanding of the risks benefits and agreed with plans for surgical intervention.  James P. Hooten, Jr. M.D.    

## 2017-07-20 NOTE — Anesthesia Procedure Notes (Signed)
Procedure Name: LMA Insertion Date/Time: 07/20/2017 9:06 AM Performed by: Silvana Newness Pre-anesthesia Checklist: Patient identified, Emergency Drugs available, Suction available, Patient being monitored and Timeout performed Patient Re-evaluated:Patient Re-evaluated prior to induction Oxygen Delivery Method: Circle system utilized Preoxygenation: Pre-oxygenation with 100% oxygen Induction Type: IV induction Ventilation: Mask ventilation without difficulty LMA: LMA inserted LMA Size: 4.0 Number of attempts: 1 Placement Confirmation: positive ETCO2 and breath sounds checked- equal and bilateral Tube secured with: Tape Dental Injury: Teeth and Oropharynx as per pre-operative assessment

## 2017-07-23 NOTE — Anesthesia Postprocedure Evaluation (Signed)
Anesthesia Post Note  Patient: Tiffany Delacruz  Procedure(s) Performed: CARPAL TUNNEL RELEASE (Left )  Patient location during evaluation: PACU Anesthesia Type: General Level of consciousness: awake and alert Pain management: pain level controlled Vital Signs Assessment: post-procedure vital signs reviewed and stable Respiratory status: spontaneous breathing, nonlabored ventilation, respiratory function stable and patient connected to nasal cannula oxygen Cardiovascular status: blood pressure returned to baseline and stable Postop Assessment: no apparent nausea or vomiting Anesthetic complications: no     Last Vitals:  Vitals:   07/20/17 1055 07/20/17 1126  BP: (!) 150/77 (!) 148/64  Pulse: 81 79  Resp: 16   Temp: (!) 35.9 C   SpO2: 97% 97%    Last Pain:  Vitals:   07/23/17 0820  TempSrc:   PainSc: 2                  Molli Barrows

## 2017-10-12 ENCOUNTER — Encounter: Payer: Self-pay | Admitting: Unknown Physician Specialty

## 2017-10-12 ENCOUNTER — Ambulatory Visit: Payer: Medicare Other | Admitting: Unknown Physician Specialty

## 2017-10-12 VITALS — BP 149/82 | HR 90 | Temp 98.4°F | Wt 224.0 lb

## 2017-10-12 DIAGNOSIS — R058 Other specified cough: Secondary | ICD-10-CM

## 2017-10-12 DIAGNOSIS — J069 Acute upper respiratory infection, unspecified: Secondary | ICD-10-CM | POA: Diagnosis not present

## 2017-10-12 DIAGNOSIS — R05 Cough: Secondary | ICD-10-CM

## 2017-10-12 MED ORDER — BENZONATATE 200 MG PO CAPS: 200.0000 mg | ORAL_CAPSULE | Freq: Two times a day (BID) | ORAL | 0 refills | Status: DC | PRN

## 2017-10-12 MED ORDER — ALBUTEROL SULFATE HFA 108 (90 BASE) MCG/ACT IN AERS: 2.0000 | INHALATION_SPRAY | Freq: Four times a day (QID) | RESPIRATORY_TRACT | 2 refills | Status: DC | PRN

## 2017-10-12 MED ORDER — PREDNISONE 20 MG PO TABS
40.0000 mg | ORAL_TABLET | Freq: Every day | ORAL | 0 refills | Status: DC
Start: 2017-10-12 — End: 2017-11-09

## 2017-10-12 NOTE — Progress Notes (Signed)
BP (!) 149/82   Pulse 90   Temp 98.4 F (36.9 C) (Oral)   Wt 224 lb (101.6 kg)   LMP 12/15/1991 (Approximate)   SpO2 98%   BMI 36.15 kg/m    Subjective:    Patient ID: Tiffany Delacruz, female    DOB: November 10, 1950, 67 y.o.   MRN: [id number]  HPI: Tiffany Delacruz is a 67 y.o. female  Chief Complaint  Patient presents with  . Cough    pt states she has had a cough, wheezing, and congestion for a week    Cough  This is a new (Started out with a cold) problem. The current episode started in the past 7 days. The problem has been gradually worsening. The problem occurs constantly. The cough is productive of sputum. Associated symptoms include chills and wheezing. Pertinent negatives include no chest pain, ear congestion, ear pain, fever, headaches, heartburn, hemoptysis, myalgias, nasal congestion, rash, rhinorrhea or shortness of breath. Nothing aggravates the symptoms. She has tried prescription cough suppressant for the symptoms. The treatment provided no relief.    Relevant past medical, surgical, family and social history reviewed and updated as indicated. Interim medical history since our last visit reviewed. Allergies and medications reviewed and updated.  Review of Systems  Constitutional: Positive for chills. Negative for fever.  HENT: Negative for ear pain and rhinorrhea.   Respiratory: Positive for cough and wheezing. Negative for hemoptysis and shortness of breath.   Cardiovascular: Negative for chest pain.  Gastrointestinal: Negative for heartburn.  Musculoskeletal: Negative for myalgias.  Skin: Negative for rash.  Neurological: Negative for headaches.    Per HPI unless specifically indicated above     Objective:    BP (!) 149/82   Pulse 90   Temp 98.4 F (36.9 C) (Oral)   Wt 224 lb (101.6 kg)   LMP 12/15/1991 (Approximate)   SpO2 98%   BMI 36.15 kg/m   Wt Readings from Last 3 Encounters:  10/12/17 224 lb (101.6 kg)  07/20/17 221 lb (100.2 kg)  04/25/17  221 lb 3.2 oz (100.3 kg)    Physical Exam  Constitutional: She is oriented to person, place, and time. She appears well-developed and well-nourished. No distress.  HENT:  Head: Normocephalic and atraumatic.  Right Ear: Tympanic membrane and ear canal normal.  Left Ear: Tympanic membrane and ear canal normal.  Nose: Rhinorrhea present. Right sinus exhibits no maxillary sinus tenderness and no frontal sinus tenderness. Left sinus exhibits no maxillary sinus tenderness and no frontal sinus tenderness.  Mouth/Throat: Mucous membranes are normal. Posterior oropharyngeal erythema present.  Eyes: Conjunctivae and lids are normal. Right eye exhibits no discharge. Left eye exhibits no discharge. No scleral icterus.  Cardiovascular: Normal rate and regular rhythm.  Pulmonary/Chest: Effort normal and breath sounds normal. No respiratory distress.  Abdominal: Normal appearance. There is no splenomegaly or hepatomegaly.  Musculoskeletal: Normal range of motion.  Neurological: She is alert and oriented to person, place, and time.  Skin: Skin is intact. No rash noted. No pallor.  Psychiatric: She has a normal mood and affect. Her behavior is normal. Judgment and thought content normal.    Results for orders placed or performed in visit on 04/17/17  TSH  Result Value Ref Range   TSH 0.514 0.450 - 4.500 uIU/mL      Assessment & Plan:   Problem List Items Addressed This Visit    None    Visit Diagnoses    Post-viral cough syndrome    -  Primary  Discussed cough trajectory.  Prednisone and Tessalon perles for cough   Viral upper respiratory tract infection       New problem.  Illness running it's course and feeling generally better.  Pt ed on rest and fluids.         Follow up plan: Return if symptoms worsen or fail to improve.

## 2017-10-15 ENCOUNTER — Other Ambulatory Visit: Payer: Self-pay | Admitting: Unknown Physician Specialty

## 2017-10-30 ENCOUNTER — Encounter: Payer: Medicare Other | Admitting: Unknown Physician Specialty

## 2017-11-09 ENCOUNTER — Ambulatory Visit (INDEPENDENT_AMBULATORY_CARE_PROVIDER_SITE_OTHER): Payer: Medicare Other | Admitting: Unknown Physician Specialty

## 2017-11-09 ENCOUNTER — Ambulatory Visit (INDEPENDENT_AMBULATORY_CARE_PROVIDER_SITE_OTHER): Payer: Medicare Other

## 2017-11-09 ENCOUNTER — Encounter: Payer: Self-pay | Admitting: Unknown Physician Specialty

## 2017-11-09 VITALS — BP 142/82 | HR 80 | Temp 98.1°F | Resp 17 | Ht 65.5 in | Wt 225.7 lb

## 2017-11-09 VITALS — BP 142/82 | HR 80 | Temp 98.1°F | Ht 65.5 in | Wt 225.7 lb

## 2017-11-09 DIAGNOSIS — E039 Hypothyroidism, unspecified: Secondary | ICD-10-CM | POA: Diagnosis not present

## 2017-11-09 DIAGNOSIS — Z0000 Encounter for general adult medical examination without abnormal findings: Secondary | ICD-10-CM

## 2017-11-09 DIAGNOSIS — I10 Essential (primary) hypertension: Secondary | ICD-10-CM | POA: Diagnosis not present

## 2017-11-09 DIAGNOSIS — Z1231 Encounter for screening mammogram for malignant neoplasm of breast: Secondary | ICD-10-CM | POA: Diagnosis not present

## 2017-11-09 DIAGNOSIS — Z23 Encounter for immunization: Secondary | ICD-10-CM

## 2017-11-09 DIAGNOSIS — Z1239 Encounter for other screening for malignant neoplasm of breast: Secondary | ICD-10-CM

## 2017-11-09 NOTE — Progress Notes (Signed)
BP (!) 142/82   Pulse 80   Temp 98.1 F (36.7 C)   Ht 5' 5.5" (1.664 m)   Wt 225 lb 11.2 oz (102.4 kg)   LMP 12/15/1991 (Approximate)   BMI 36.99 kg/m    Subjective:    Patient ID: Tiffany Delacruz, female    DOB: 1951/01/29, 67 y.o.   MRN: [id number]  HPI: Tiffany Delacruz is a 68 y.o. female  Chief Complaint  Patient presents with  . Annual Exam   Hypertension Using medications without difficulty Average home BPs   No problems or lightheadedness No chest pain with exertion or shortness of breath No Edema  Hypothyroid No weight or energy changes  Relevant past medical, surgical, family and social history reviewed and updated as indicated. Interim medical history since our last visit reviewed. Allergies and medications reviewed and updated.  Review of Systems  Constitutional: Negative.   HENT: Negative.   Eyes: Negative.   Respiratory: Negative.   Cardiovascular: Negative.   Gastrointestinal: Negative.   Endocrine: Negative.   Genitourinary: Negative.   Musculoskeletal: Negative.   Skin: Negative.   Allergic/Immunologic: Negative.   Neurological: Negative.   Hematological: Negative.   Psychiatric/Behavioral: Negative.     Per HPI unless specifically indicated above     Objective:    BP (!) 142/82   Pulse 80   Temp 98.1 F (36.7 C)   Ht 5' 5.5" (1.664 m)   Wt 225 lb 11.2 oz (102.4 kg)   LMP 12/15/1991 (Approximate)   BMI 36.99 kg/m   Wt Readings from Last 3 Encounters:  11/09/17 225 lb 11.2 oz (102.4 kg)  11/09/17 225 lb 11.2 oz (102.4 kg)  10/12/17 224 lb (101.6 kg)    Physical Exam  Constitutional: She is oriented to person, place, and time. She appears well-developed and well-nourished.  HENT:  Head: Normocephalic and atraumatic.  Eyes: Pupils are equal, round, and reactive to light. Right eye exhibits no discharge. Left eye exhibits no discharge. No scleral icterus.  Neck: Normal range of motion. Neck supple. Carotid bruit is not present. No  thyromegaly present.  Cardiovascular: Normal rate, regular rhythm and normal heart sounds. Exam reveals no gallop and no friction rub.  No murmur heard. Pulmonary/Chest: Effort normal and breath sounds normal. No respiratory distress. She has no wheezes. She has no rales.  Abdominal: Soft. Bowel sounds are normal. There is no tenderness. There is no rebound.  Genitourinary: No breast swelling, tenderness or discharge.  Musculoskeletal: Normal range of motion.  Lymphadenopathy:    She has no cervical adenopathy.  Neurological: She is alert and oriented to person, place, and time.  Skin: Skin is warm, dry and intact. No rash noted.  Psychiatric: She has a normal mood and affect. Her speech is normal and behavior is normal. Judgment and thought content normal. Cognition and memory are normal.    Results for orders placed or performed in visit on 04/17/17  TSH  Result Value Ref Range   TSH 0.514 0.450 - 4.500 uIU/mL      Assessment & Plan:   Problem List Items Addressed This Visit      Unprioritized   Hypertension    Stable, continue present medications.        Relevant Orders   Lipid Panel w/o Chol/HDL Ratio   Comprehensive metabolic panel   Hypothyroidism - Primary   Relevant Orders   TSH    Other Visit Diagnoses    Routine general medical examination at a health care facility  Breast cancer screening       Relevant Orders   MM DIGITAL SCREENING BILATERAL      Health maintenance Mammogram scheduled Refusing Dexa scan Refusing flu vaccination Td due 2024 Conosnocopy due 2023  Follow up plan: Return in about 6 months (around 05/09/2018).

## 2017-11-09 NOTE — Patient Instructions (Signed)
Please do call to schedule your mammogram; the number to schedule one at either [facility] or [facility] is [phone]   

## 2017-11-09 NOTE — Assessment & Plan Note (Signed)
Stable, continue present medications.   

## 2017-11-09 NOTE — Patient Instructions (Signed)
Tiffany Delacruz , Thank you for taking time to come for your Medicare Wellness Visit. I appreciate your ongoing commitment to your health goals. Please review the following plan we discussed and let me know if I can assist you in the future.   Screening recommendations/referrals: Colonoscopy: completed 08/29/2012 Mammogram: declined Bone Density: declined Recommended yearly ophthalmology/optometry visit for glaucoma screening and checkup Recommended yearly dental visit for hygiene and checkup  Vaccinations: Influenza vaccine: declined  Pneumococcal vaccine: pneumovax 23 done today, completed series Tdap vaccine: up to date  Shingles vaccine: shingrix eligible. Call your pharmacy/insurance company for coverage information   Advanced directives: Please bring a copy of your health care power of attorney and living will to the office at your convenience.  Conditions/risks identified: Recommend drinking at least 6-8 glasses of water a day   Next appointment: Follow up in one year for your annual wellness exam.    Preventive Care 69 Years and Older, Female Preventive care refers to lifestyle choices and visits with your health care provider that can promote health and wellness. What does preventive care include?  A yearly physical exam. This is also called an annual well check.  Dental exams once or twice a year.  Routine eye exams. Ask your health care provider how often you should have your eyes checked.  Personal lifestyle choices, including:  Daily care of your teeth and gums.  Regular physical activity.  Eating a healthy diet.  Avoiding tobacco and drug use.  Limiting alcohol use.  Practicing safe sex.  Taking low-dose aspirin every day.  Taking vitamin and mineral supplements as recommended by your health care provider. What happens during an annual well check? The services and screenings done by your health care provider during your annual well check will depend on your  age, overall health, lifestyle risk factors, and family history of disease. Counseling  Your health care provider may ask you questions about your:  Alcohol use.  Tobacco use.  Drug use.  Emotional well-being.  Home and relationship well-being.  Sexual activity.  Eating habits.  History of falls.  Memory and ability to understand (cognition).  Work and work Statistician.  Reproductive health. Screening  You may have the following tests or measurements:  Height, weight, and BMI.  Blood pressure.  Lipid and cholesterol levels. These may be checked every 5 years, or more frequently if you are over 62 years old.  Skin check.  Lung cancer screening. You may have this screening every year starting at age 69 if you have a 30-pack-year history of smoking and currently smoke or have quit within the past 15 years.  Fecal occult blood test (FOBT) of the stool. You may have this test every year starting at age 69.  Flexible sigmoidoscopy or colonoscopy. You may have a sigmoidoscopy every 5 years or a colonoscopy every 10 years starting at age 69.  Hepatitis C blood test.  Hepatitis B blood test.  Sexually transmitted disease (STD) testing.  Diabetes screening. This is done by checking your blood sugar (glucose) after you have not eaten for a while (fasting). You may have this done every 1-3 years.  Bone density scan. This is done to screen for osteoporosis. You may have this done starting at age 69.  Mammogram. This may be done every 1-2 years. Talk to your health care provider about how often you should have regular mammograms. Talk with your health care provider about your test results, treatment options, and if necessary, the need for more  tests. Vaccines  Your health care provider may recommend certain vaccines, such as:  Influenza vaccine. This is recommended every year.  Tetanus, diphtheria, and acellular pertussis (Tdap, Td) vaccine. You may need a Td booster  every 10 years.  Zoster vaccine. You may need this after age 18.  Pneumococcal 13-valent conjugate (PCV13) vaccine. One dose is recommended after age 69.  Pneumococcal polysaccharide (PPSV23) vaccine. One dose is recommended after age 69. Talk to your health care provider about which screenings and vaccines you need and how often you need them. This information is not intended to replace advice given to you by your health care provider. Make sure you discuss any questions you have with your health care provider. Document Released: 10/08/2015 Document Revised: 05/31/2016 Document Reviewed: 07/13/2015 Elsevier Interactive Patient Education  2017 [place] Prevention in the Home Falls can cause injuries. They can happen to people of all ages. There are many things you can do to make your home safe and to help prevent falls. What can I do on the outside of my home?  Regularly fix the edges of walkways and driveways and fix any cracks.  Remove anything that might make you trip as you walk through a door, such as a raised step or threshold.  Trim any bushes or trees on the path to your home.  Use bright outdoor lighting.  Clear any walking paths of anything that might make someone trip, such as rocks or tools.  Regularly check to see if handrails are loose or broken. Make sure that both sides of any steps have handrails.  Any raised decks and porches should have guardrails on the edges.  Have any leaves, snow, or ice cleared regularly.  Use sand or salt on walking paths during winter.  Clean up any spills in your garage right away. This includes oil or grease spills. What can I do in the bathroom?  Use night lights.  Install grab bars by the toilet and in the tub and shower. Do not use towel bars as grab bars.  Use non-skid mats or decals in the tub or shower.  If you need to sit down in the shower, use a plastic, non-slip stool.  Keep the floor dry. Clean up any  water that spills on the floor as soon as it happens.  Remove soap buildup in the tub or shower regularly.  Attach bath mats securely with double-sided non-slip rug tape.  Do not have throw rugs and other things on the floor that can make you trip. What can I do in the bedroom?  Use night lights.  Make sure that you have a light by your bed that is easy to reach.  Do not use any sheets or blankets that are too big for your bed. They should not hang down onto the floor.  Have a firm chair that has side arms. You can use this for support while you get dressed.  Do not have throw rugs and other things on the floor that can make you trip. What can I do in the kitchen?  Clean up any spills right away.  Avoid walking on wet floors.  Keep items that you use a lot in easy-to-reach places.  If you need to reach something above you, use a strong step stool that has a grab bar.  Keep electrical cords out of the way.  Do not use floor polish or wax that makes floors slippery. If you must use wax, use non-skid floor  wax.  Do not have throw rugs and other things on the floor that can make you trip. What can I do with my stairs?  Do not leave any items on the stairs.  Make sure that there are handrails on both sides of the stairs and use them. Fix handrails that are broken or loose. Make sure that handrails are as long as the stairways.  Check any carpeting to make sure that it is firmly attached to the stairs. Fix any carpet that is loose or worn.  Avoid having throw rugs at the top or bottom of the stairs. If you do have throw rugs, attach them to the floor with carpet tape.  Make sure that you have a light switch at the top of the stairs and the bottom of the stairs. If you do not have them, ask someone to add them for you. What else can I do to help prevent falls?  Wear shoes that:  Do not have high heels.  Have rubber bottoms.  Are comfortable and fit you well.  Are closed  at the toe. Do not wear sandals.  If you use a stepladder:  Make sure that it is fully opened. Do not climb a closed stepladder.  Make sure that both sides of the stepladder are locked into place.  Ask someone to hold it for you, if possible.  Clearly mark and make sure that you can see:  Any grab bars or handrails.  First and last steps.  Where the edge of each step is.  Use tools that help you move around (mobility aids) if they are needed. These include:  Canes.  Walkers.  Scooters.  Crutches.  Turn on the lights when you go into a dark area. Replace any light bulbs as soon as they burn out.  Set up your furniture so you have a clear path. Avoid moving your furniture around.  If any of your floors are uneven, fix them.  If there are any pets around you, be aware of where they are.  Review your medicines with your doctor. Some medicines can make you feel dizzy. This can increase your chance of falling. Ask your doctor what other things that you can do to help prevent falls. This information is not intended to replace advice given to you by your health care provider. Make sure you discuss any questions you have with your health care provider. Document Released: 07/08/2009 Document Revised: 02/17/2016 Document Reviewed: 10/16/2014 Elsevier Interactive Patient Education  2017 [employer].

## 2017-11-09 NOTE — Progress Notes (Signed)
Subjective:   Tiffany Delacruz is a 67 y.o. female who presents for Medicare Annual (Subsequent) preventive examination.  Review of Systems:  Cardiac Risk Factors include: advanced age (>22men, >34 women);hypertension;obesity (BMI >30kg/m2);dyslipidemia     Objective:     Vitals: BP (!) 142/82 (BP Location: Left Arm, Patient Position: Sitting)   Pulse 80   Temp 98.1 F (36.7 C) (Temporal)   Resp 17   Ht 5' 5.5" (1.664 m)   Wt 225 lb 11.2 oz (102.4 kg)   LMP 12/15/1991 (Approximate)   BMI 36.99 kg/m   Body mass index is 36.99 kg/m.  Advanced Directives 11/09/2017 07/17/2017 10/24/2016 10/20/2015  Does Patient Have a Medical Advance Directive? Yes Yes Yes Yes  Type of Paramedic of [place];Living will - [place];Living will [place];Living will  Copy of [place] in Chart? No - copy requested - - -    Tobacco Social History   Tobacco Use  Smoking Status Never Smoker  Smokeless Tobacco Never Used     Counseling given: Not Answered   Clinical Intake:  Pre-visit preparation completed: Yes  Pain : No/denies pain     Nutritional Status: BMI > 30  Obese Nutritional Risks: None Diabetes: No  How often do you need to have someone help you when you read instructions, pamphlets, or other written materials from your doctor or pharmacy?: 1 - Never What is the last grade level you completed in school?: 4 years college   Interpreter Needed?: No  Information entered by :: Trashaun Streight,LPN   Past Medical History:  Diagnosis Date  . Anxiety   . Depression   . GERD (gastroesophageal reflux disease)   . Graves disease   . Hyperlipidemia   . Hypertension   . Hypothyroidism   . Obesity   . Osteoarthritis   . Sleep apnea    NO CPAP   Past Surgical History:  Procedure Laterality Date  . BACK SURGERY  1988  . CARPAL TUNNEL RELEASE Left 07/20/2017   Procedure: CARPAL TUNNEL RELEASE;   Surgeon: Dereck Leep, MD;  Location: [facility] ORS;  Service: Orthopedics;  Laterality: Left;  . COLONOSCOPY    . disk repair    . JOINT REPLACEMENT Right 05/2013   knee replacement   Family History  Problem Relation Age of Onset  . Cancer Mother        uterine and colon  . Heart attack Father   . Heart disease Father   . Alcohol abuse Father   . Diabetes Maternal Grandmother    Social History   Socioeconomic History  . Marital status: Widowed    Spouse name: None  . Number of children: None  . Years of education: None  . Highest education level: None  Social Needs  . Financial resource strain: Not hard at all  . Food insecurity - worry: Never true  . Food insecurity - inability: Never true  . Transportation needs - medical: No  . Transportation needs - non-medical: No  Occupational History  . None  Tobacco Use  . Smoking status: Never Smoker  . Smokeless tobacco: Never Used  Substance and Sexual Activity  . Alcohol use: No    Alcohol/week: 0.0 oz  . Drug use: No  . Sexual activity: No  Other Topics Concern  . None  Social History Narrative  . None    Outpatient Encounter Medications as of 11/09/2017  Medication Sig  . albuterol (PROVENTIL HFA;VENTOLIN  HFA) 108 (90 Base) MCG/ACT inhaler Inhale 2 puffs into the lungs every 6 (six) hours as needed for wheezing or shortness of breath.  . famotidine (PEPCID) 20 MG tablet Take 20 mg by mouth daily after lunch.   . fluticasone (FLONASE) 50 MCG/ACT nasal spray Place 2 sprays into both nostrils daily.  Marland Kitchen levothyroxine (SYNTHROID, LEVOTHROID) 175 MCG tablet TAKE 1 TABLET (175 MCG TOTAL) BY MOUTH DAILY BEFORE BREAKFAST.  Marland Kitchen losartan (COZAAR) 50 MG tablet TAKE 1 TABLET BY MOUTH EVERY DAY  . [DISCONTINUED] benzonatate (TESSALON) 200 MG capsule Take 1 capsule (200 mg total) by mouth 2 (two) times daily as needed for cough. (Patient not taking: Reported on 11/09/2017)  . [DISCONTINUED] predniSONE (DELTASONE) 20 MG tablet Take 2  tablets (40 mg total) by mouth daily with breakfast. (Patient not taking: Reported on 11/09/2017)   No facility-administered encounter medications on file as of 11/09/2017.     Activities of Daily Living In your present state of health, do you have any difficulty performing the following activities: 11/09/2017 07/20/2017  Hearing? N N  Vision? N N  Difficulty concentrating or making decisions? N N  Walking or climbing stairs? N N  Dressing or bathing? N N  Doing errands, shopping? N -  Preparing Food and eating ? N -  Using the Toilet? N -  In the past six months, have you accidently leaked urine? N -  Do you have problems with loss of bowel control? N -  Managing your Medications? N -  Managing your Finances? N -  Housekeeping or managing your Housekeeping? N -  Some recent data might be hidden    Patient Care Team: Kathrine Haddock, NP as PCP - General (Nurse Practitioner) Kathrine Haddock, NP as PCP - Family Medicine (Nurse Practitioner)    Assessment:   This is a routine wellness examination for [place].  Exercise Activities and Dietary recommendations Current Exercise Habits: The patient does not participate in regular exercise at present, Exercise limited by: None identified  Goals    . DIET - INCREASE WATER INTAKE     Recommend drinking at least 6-8 glasses of water a day        Fall Risk Fall Risk  11/09/2017 10/24/2016 10/20/2015  Falls in the past year? No No No   Is the patient's home free of loose throw rugs in walkways, pet beds, electrical cords, etc?   yes      Grab bars in the bathroom? no      Handrails on the stairs?  yes      Adequate lighting?   yes  Timed Get Up and Go performed: Completed in 8 seconds with no use of assistive devices, steady gait. No intervention needed at this time.   Depression Screen PHQ 2/9 Scores 11/09/2017 04/25/2017 10/24/2016 04/18/2016  PHQ - 2 Score 0 0 0 0  PHQ- 9 Score 1 0 0 -     Cognitive Function     6CIT Screen  11/09/2017  What Year? 0 points  What month? 0 points  What time? 0 points  Count back from 20 0 points  Months in reverse 0 points  Repeat phrase 0 points  Total Score 0    Immunization History  Administered Date(s) Administered  . Influenza, High Dose Seasonal PF 07/17/2016  . Influenza,inj,Quad PF,6+ Mos 10/20/2015  . Influenza-Unspecified 07/09/2014  . Pneumococcal Conjugate-13 10/20/2015  . Pneumococcal Polysaccharide-23 06/24/2012, 11/09/2017  . Td 09/25/2001  . Tdap 12/23/2012    Qualifies  for Shingles Vaccine?discussed shingrix vaccine  Screening Tests Health Maintenance  Topic Date Due  . INFLUENZA VACCINE  07/12/2018 (Originally 04/25/2017)  . MAMMOGRAM  11/09/2018 (Originally 12/18/2013)  . DEXA SCAN  11/09/2018 (Originally 04/24/2014)  . COLONOSCOPY  08/29/2022  . TETANUS/TDAP  12/24/2022  . Hepatitis C Screening  Completed  . PNA vac Low Risk Adult  Completed    Cancer Screenings: Lung: Low Dose CT Chest recommended if Age 90-80 years, 30 pack-year currently smoking OR have quit w/in 15years. Patient does not qualify. Breast:  Up to date on Mammogram? No  declined Up to date of Bone Density/Dexa? No declined  Colorectal: completed 08/29/2012  Additional Screenings:  Hepatitis B/HIV/Syphillis: not indicated  Hepatitis C Screening: completed 10/20/2015     Plan:    I have personally reviewed and addressed the Medicare Annual Wellness questionnaire and have noted the following in the patient's chart:  A. Medical and social history B. Use of alcohol, tobacco or illicit drugs  C. Current medications and supplements D. Functional ability and status E.  Nutritional status F.  Physical activity G. Advance directives H. List of other physicians I.  Hospitalizations, surgeries, and ER visits in previous 12 months J.  [place] such as hearing and vision if needed, cognitive and depression L. Referrals and appointments   In addition, I have reviewed  and discussed with patient certain preventive protocols, quality metrics, and best practice recommendations. A written personalized care plan for preventive services as well as general preventive health recommendations were provided to patient.   Signed,  Tyler Aas, LPN Nurse Health Advisor   Nurse Notes:none

## 2017-11-10 LAB — COMPREHENSIVE METABOLIC PANEL
ALT: 10 IU/L (ref 0–32)
AST: 9 IU/L (ref 0–40)
Albumin/Globulin Ratio: 1.6 (ref 1.2–2.2)
Albumin: 4.1 g/dL (ref 3.6–4.8)
Alkaline Phosphatase: 73 IU/L (ref 39–117)
BUN/Creatinine Ratio: 24 (ref 12–28)
BUN: 17 mg/dL (ref 8–27)
Bilirubin Total: 0.6 mg/dL (ref 0.0–1.2)
CHLORIDE: 106 mmol/L (ref 96–106)
CO2: 25 mmol/L (ref 20–29)
Calcium: 9 mg/dL (ref 8.7–10.3)
Creatinine, Ser: 0.72 mg/dL (ref 0.57–1.00)
GFR calc Af Amer: 100 mL/min/{1.73_m2} (ref >59)
GFR calc non Af Amer: 86 mL/min/{1.73_m2} (ref >59)
GLOBULIN, TOTAL: 2.5 g/dL (ref 1.5–4.5)
Glucose: 97 mg/dL (ref 65–99)
POTASSIUM: 4.2 mmol/L (ref 3.5–5.2)
SODIUM: 146 mmol/L — AB (ref 134–144)
Total Protein: 6.6 g/dL (ref 6.0–8.5)

## 2017-11-10 LAB — LIPID PANEL W/O CHOL/HDL RATIO
CHOLESTEROL TOTAL: 202 mg/dL — AB (ref 100–199)
HDL: 59 mg/dL (ref >39)
LDL CALC: 125 mg/dL — AB (ref 0–99)
Triglycerides: 88 mg/dL (ref 0–149)
VLDL CHOLESTEROL CAL: 18 mg/dL (ref 5–40)

## 2017-11-10 LAB — TSH: TSH: 0.398 u[IU]/mL — AB (ref 0.450–4.500)

## 2017-11-12 ENCOUNTER — Encounter: Payer: Self-pay | Admitting: Unknown Physician Specialty

## 2018-01-09 ENCOUNTER — Other Ambulatory Visit: Payer: Self-pay | Admitting: Unknown Physician Specialty

## 2018-04-08 ENCOUNTER — Other Ambulatory Visit: Payer: Self-pay | Admitting: Unknown Physician Specialty

## 2018-04-10 ENCOUNTER — Encounter: Payer: Self-pay | Admitting: Unknown Physician Specialty

## 2018-04-13 ENCOUNTER — Other Ambulatory Visit: Payer: Self-pay | Admitting: Unknown Physician Specialty

## 2018-04-16 NOTE — Telephone Encounter (Signed)
Losartan refill Last Refill:01/09/18 # 90 1 RF Last OV: 11/09/17 PCP: Kathrine Haddock NP [facility] [address]

## 2018-05-10 ENCOUNTER — Ambulatory Visit: Payer: Medicare Other | Admitting: Unknown Physician Specialty

## 2018-05-13 ENCOUNTER — Ambulatory Visit: Payer: Medicare Other | Admitting: Physician Assistant

## 2018-05-13 ENCOUNTER — Encounter: Payer: Self-pay | Admitting: Physician Assistant

## 2018-05-13 VITALS — BP 148/96 | HR 69 | Wt 235.0 lb

## 2018-05-13 DIAGNOSIS — E785 Hyperlipidemia, unspecified: Secondary | ICD-10-CM | POA: Diagnosis not present

## 2018-05-13 DIAGNOSIS — I10 Essential (primary) hypertension: Secondary | ICD-10-CM

## 2018-05-13 DIAGNOSIS — J3089 Other allergic rhinitis: Secondary | ICD-10-CM | POA: Diagnosis not present

## 2018-05-13 DIAGNOSIS — E039 Hypothyroidism, unspecified: Secondary | ICD-10-CM | POA: Diagnosis not present

## 2018-05-13 MED ORDER — FLUTICASONE PROPIONATE 50 MCG/ACT NA SUSP: 2.0000 | Freq: Every day | NASAL | 3 refills | Status: DC

## 2018-05-13 MED ORDER — LOSARTAN POTASSIUM 100 MG PO TABS: 100.0000 mg | ORAL_TABLET | Freq: Every day | ORAL | 0 refills | Status: DC

## 2018-05-13 NOTE — Progress Notes (Signed)
Subjective:    Patient ID: Tiffany Delacruz, female    DOB: 1951/06/15, 67 y.o.   MRN: [id number]  Tiffany Delacruz is a 67 y.o. female presenting on 05/13/2018 for Follow-up; Hypertension; and Thyroid Problem   HPI   HTN: Currently taking Losartan 50 mg daily. Bps elevated previous visits. Denies CP, SOB, headaches.    BP Readings from Last 3 Encounters:  05/13/18 (!) 148/96  11/09/17 (!) 142/82  11/09/17 (!) 142/82   HLD  The 10-year ASCVD risk score Mikey Bussing DC Brooke Bonito., et al., 2013) is: 14.9%   Values used to calculate the score:     Age: 54 years     Sex: Female     Is Non-Hispanic African American: No     Diabetic: No     Tobacco smoker: No     Systolic Blood Pressure: 826 mmHg     Is BP treated: Yes     HDL Cholesterol: 55 mg/dL     Total Cholesterol: 198 mg/dL   Hypothyroidism: Currently taking 175 mcg synthroid without issue.   Social History   Tobacco Use  . Smoking status: Never Smoker  . Smokeless tobacco: Never Used  Substance Use Topics  . Alcohol use: No    Alcohol/week: 0.0 standard drinks  . Drug use: No    Review of Systems Per HPI unless specifically indicated above     Objective:    BP (!) 148/96   Pulse 69   Wt 235 lb (106.6 kg)   LMP 12/15/1991 (Approximate)   SpO2 97%   BMI 38.51 kg/m   Wt Readings from Last 3 Encounters:  05/13/18 235 lb (106.6 kg)  11/09/17 225 lb 11.2 oz (102.4 kg)  11/09/17 225 lb 11.2 oz (102.4 kg)    Physical Exam  Constitutional: She is oriented to person, place, and time. She appears well-developed and well-nourished.  Cardiovascular: Normal rate and regular rhythm.  Pulmonary/Chest: Effort normal and breath sounds normal.  Neurological: She is alert and oriented to person, place, and time.  Skin: Skin is warm and dry.  Psychiatric: She has a normal mood and affect. Her behavior is normal.   Results for orders placed or performed in visit on 05/13/18  TSH  Result Value Ref Range   TSH 1.410 0.450 - 4.500  uIU/mL  Lipid Profile  Result Value Ref Range   Cholesterol, Total 198 100 - 199 mg/dL   Triglycerides 136 0 - 149 mg/dL   HDL 55 >39 mg/dL   VLDL Cholesterol Cal 27 5 - 40 mg/dL   LDL Calculated 116 (H) 0 - 99 mg/dL   Chol/HDL Ratio 3.6 0.0 - 4.4 ratio  Comp Met (CMET)  Result Value Ref Range   Glucose 78 65 - 99 mg/dL   BUN 15 8 - 27 mg/dL   Creatinine, Ser 0.86 0.57 - 1.00 mg/dL   GFR calc non Af Amer 69 >59 mL/min/1.73   GFR calc Af Amer 80 >59 mL/min/1.73   BUN/Creatinine Ratio 17 12 - 28   Sodium 143 134 - 144 mmol/L   Potassium 4.3 3.5 - 5.2 mmol/L   Chloride 102 96 - 106 mmol/L   CO2 27 20 - 29 mmol/L   Calcium 9.2 8.7 - 10.3 mg/dL   Total Protein 6.5 6.0 - 8.5 g/dL   Albumin 4.3 3.6 - 4.8 g/dL   Globulin, Total 2.2 1.5 - 4.5 g/dL   Albumin/Globulin Ratio 2.0 1.2 - 2.2   Bilirubin Total 1.0 0.0 - 1.2 mg/dL  Alkaline Phosphatase 66 39 - 117 IU/L   AST 10 0 - 40 IU/L   ALT 9 0 - 32 IU/L      Assessment & Plan:  1. Essential hypertension  Increase Losaratn from 50 mg to 100 mg daily.    - Comp Met (CMET) - losartan (COZAAR) 100 MG tablet; Take 1 tablet (100 mg total) by mouth daily.  Dispense: 90 tablet; Refill: 0  2. Hypothyroidism, unspecified type  Labs stable, refill synthroid 175 mcg.  - TSH  3. Hyperlipidemia, unspecified hyperlipidemia type  Elevated CVD risk. Recommend Lipitor 10 mg.   - Lipid Profile  4. Other allergic rhinitis  - fluticasone (FLONASE) 50 MCG/ACT nasal spray; Place 2 sprays into both nostrils daily.  Dispense: 48 g; Refill: 3   Follow up plan: Return in about 3 months (around 08/13/2018) for HTN medication and thyroid.  Carles Collet, PA-C [facility] 05/14/2018, 12:29 PM

## 2018-05-14 LAB — COMPREHENSIVE METABOLIC PANEL
ALK PHOS: 66 IU/L (ref 39–117)
ALT: 9 IU/L (ref 0–32)
AST: 10 IU/L (ref 0–40)
Albumin/Globulin Ratio: 2 (ref 1.2–2.2)
Albumin: 4.3 g/dL (ref 3.6–4.8)
BUN/Creatinine Ratio: 17 (ref 12–28)
BUN: 15 mg/dL (ref 8–27)
Bilirubin Total: 1 mg/dL (ref 0.0–1.2)
CO2: 27 mmol/L (ref 20–29)
Calcium: 9.2 mg/dL (ref 8.7–10.3)
Chloride: 102 mmol/L (ref 96–106)
Creatinine, Ser: 0.86 mg/dL (ref 0.57–1.00)
GFR calc Af Amer: 80 mL/min/{1.73_m2} (ref >59)
GFR calc non Af Amer: 69 mL/min/{1.73_m2} (ref >59)
GLOBULIN, TOTAL: 2.2 g/dL (ref 1.5–4.5)
Glucose: 78 mg/dL (ref 65–99)
POTASSIUM: 4.3 mmol/L (ref 3.5–5.2)
SODIUM: 143 mmol/L (ref 134–144)
Total Protein: 6.5 g/dL (ref 6.0–8.5)

## 2018-05-14 LAB — LIPID PANEL
Chol/HDL Ratio: 3.6 ratio (ref 0.0–4.4)
Cholesterol, Total: 198 mg/dL (ref 100–199)
HDL: 55 mg/dL (ref >39)
LDL CALC: 116 mg/dL — AB (ref 0–99)
Triglycerides: 136 mg/dL (ref 0–149)
VLDL CHOLESTEROL CAL: 27 mg/dL (ref 5–40)

## 2018-05-14 LAB — TSH: TSH: 1.41 u[IU]/mL (ref 0.450–4.500)

## 2018-05-14 NOTE — Patient Instructions (Signed)

## 2018-06-03 ENCOUNTER — Other Ambulatory Visit: Payer: Self-pay | Admitting: Family Medicine

## 2018-06-03 ENCOUNTER — Other Ambulatory Visit: Payer: Self-pay | Admitting: Unknown Physician Specialty

## 2018-06-03 MED ORDER — LEVOTHYROXINE SODIUM 175 MCG PO TABS
ORAL_TABLET | ORAL | 3 refills | Status: DC
Start: 2018-06-03 — End: 2019-06-30

## 2018-06-03 NOTE — Telephone Encounter (Signed)
It was noted in the result note on 05/13/18 by Carles Collet, PA-C to reorder Levothyroxine, not reordered at that time.  Levothyroxine refill Last refill:04/08/18 #90/0 refill; TSH on 05/13/18 Last OV:05/13/18 XAQ:[id number] (Send to [place]) [id number] [place], [place] [address] [phone] (Phone) [phone] (Fax)

## 2018-07-11 ENCOUNTER — Ambulatory Visit: Payer: Medicare Other | Admitting: Family Medicine

## 2018-07-11 ENCOUNTER — Other Ambulatory Visit: Payer: Self-pay

## 2018-07-11 ENCOUNTER — Encounter: Payer: Self-pay | Admitting: Family Medicine

## 2018-07-11 VITALS — BP 137/71 | HR 79 | Temp 97.8°F | Ht 66.0 in | Wt 237.0 lb

## 2018-07-11 DIAGNOSIS — R062 Wheezing: Secondary | ICD-10-CM

## 2018-07-11 DIAGNOSIS — J3089 Other allergic rhinitis: Secondary | ICD-10-CM

## 2018-07-11 MED ORDER — ALBUTEROL SULFATE (2.5 MG/3ML) 0.083% IN NEBU
2.5000 mg | INHALATION_SOLUTION | Freq: Once | RESPIRATORY_TRACT | Status: AC
  Administered 2018-07-11: 2.5 mg via RESPIRATORY_TRACT

## 2018-07-11 MED ORDER — PREDNISONE 20 MG PO TABS: 40.0000 mg | ORAL_TABLET | Freq: Every day | ORAL | 0 refills | Status: DC

## 2018-07-11 MED ORDER — BENZONATATE 200 MG PO CAPS: 200.0000 mg | ORAL_CAPSULE | Freq: Three times a day (TID) | ORAL | 0 refills | Status: DC | PRN

## 2018-07-11 NOTE — Progress Notes (Signed)
BP 137/71   Pulse 79   Temp 97.8 F (36.6 C) (Oral)   Ht _0  (1.676 m)   Wt 237 lb (107.5 kg)   LMP 12/15/1991 (Approximate)   SpO2 97%   BMI 38.25 kg/m    Subjective:    Patient ID: Tiffany Delacruz, female    DOB: 04-Aug-1951, 67 y.o.   MRN: [id number]  HPI: Tiffany Delacruz is a 67 y.o. female  Chief Complaint  Patient presents with  . Cough    x 2 weeks/ pt states is hard to breath and she feels likeis getting worse/ states she has taking OTC meds, did not help  . Wheezing   Here today with 2 weeks of wheezing, chest tightness, SOB, and now some chest congestion. Denies fevers, chills, CP, sore throat, significant nasal congestion. Has been taking OTC mucinex and cough medications as well as using her albuterol inhaler which she states does help temporarily. No formal pulmonary diagnosis per patient, but does have spells like this periodically. Non-smoker, denies significant second-hand smoke exposure. Hx of allergic rhinitis not currently on medications.   Relevant past medical, surgical, family and social history reviewed and updated as indicated. Interim medical history since our last visit reviewed. Allergies and medications reviewed and updated.  Review of Systems  Per HPI unless specifically indicated above     Objective:    BP 137/71   Pulse 79   Temp 97.8 F (36.6 C) (Oral)   Ht _1  (1.676 m)   Wt 237 lb (107.5 kg)   LMP 12/15/1991 (Approximate)   SpO2 97%   BMI 38.25 kg/m   Wt Readings from Last 3 Encounters:  07/11/18 237 lb (107.5 kg)  05/13/18 235 lb (106.6 kg)  11/09/17 225 lb 11.2 oz (102.4 kg)    Physical Exam  Constitutional: She is oriented to person, place, and time. She appears well-developed and well-nourished. No distress.  HENT:  Head: Atraumatic.  Nasal mucosa and oropharynx erythematous B/l TMs benign  Eyes: Conjunctivae and EOM are normal.  Neck: Normal range of motion. Neck supple.  Cardiovascular: Normal rate, regular rhythm  and normal heart sounds.  Pulmonary/Chest: Effort normal. No respiratory distress. She has wheezes (moderate wheezes diffusely).  Wheezes significantly improved post-albuterol nebulizer tx  Musculoskeletal: Normal range of motion.  Lymphadenopathy:    She has no cervical adenopathy.  Neurological: She is alert and oriented to person, place, and time. No cranial nerve deficit.  Skin: Skin is warm and dry.  Psychiatric: She has a normal mood and affect. Her behavior is normal.  Nursing note and vitals reviewed.   Results for orders placed or performed in visit on 05/13/18  TSH  Result Value Ref Range   TSH 1.410 0.450 - 4.500 uIU/mL  Lipid Profile  Result Value Ref Range   Cholesterol, Total 198 100 - 199 mg/dL   Triglycerides 136 0 - 149 mg/dL   HDL 55 >39 mg/dL   VLDL Cholesterol Cal 27 5 - 40 mg/dL   LDL Calculated 116 (H) 0 - 99 mg/dL   Chol/HDL Ratio 3.6 0.0 - 4.4 ratio  Comp Met (CMET)  Result Value Ref Range   Glucose 78 65 - 99 mg/dL   BUN 15 8 - 27 mg/dL   Creatinine, Ser 0.86 0.57 - 1.00 mg/dL   GFR calc non Af Amer 69 >59 mL/min/1.73   GFR calc Af Amer 80 >59 mL/min/1.73   BUN/Creatinine Ratio 17 12 - 28   Sodium 143 134 -  144 mmol/L   Potassium 4.3 3.5 - 5.2 mmol/L   Chloride 102 96 - 106 mmol/L   CO2 27 20 - 29 mmol/L   Calcium 9.2 8.7 - 10.3 mg/dL   Total Protein 6.5 6.0 - 8.5 g/dL   Albumin 4.3 3.6 - 4.8 g/dL   Globulin, Total 2.2 1.5 - 4.5 g/dL   Albumin/Globulin Ratio 2.0 1.2 - 2.2   Bilirubin Total 1.0 0.0 - 1.2 mg/dL   Alkaline Phosphatase 66 39 - 117 IU/L   AST 10 0 - 40 IU/L   ALT 9 0 - 32 IU/L      Assessment & Plan:   Problem List Items Addressed This Visit      Respiratory   Other allergic rhinitis    Start daily zyrtec and flonase regimen. Humidifier, sinus rinses.         Other   Wheezing - Primary    Presented today with significant wheezes and air movement restriction that improved with nebulizer tx. Suspect asthma diagnosis, will  start breo (sample given today), prednisone, tessalon, and allergy regimen + mucinex to help reduce current flare. F/u in 2 weeks for lung recheck and spirometry to make a dx. Return precautions reviewed      Relevant Medications   albuterol (PROVENTIL) (2.5 MG/3ML) 0.083% nebulizer solution 2.5 mg (Completed)       Follow up plan: Return in about 2 weeks (around 07/25/2018) for Lung recheck , spiro.

## 2018-07-11 NOTE — Patient Instructions (Addendum)
Zyrtec and flonase twice daily, mucinex twice daily until feeling better Humidifier especially at night, can add essential oils or vapor rubs Breo steroid inhaler once daily, rinse mouth out well with water Albuterol inhaler as needed

## 2018-07-14 DIAGNOSIS — R062 Wheezing: Secondary | ICD-10-CM | POA: Insufficient documentation

## 2018-07-14 HISTORY — DX: Wheezing: R06.2

## 2018-07-14 NOTE — Assessment & Plan Note (Signed)
Start daily zyrtec and flonase regimen. Humidifier, sinus rinses.

## 2018-07-14 NOTE — Assessment & Plan Note (Signed)
Presented today with significant wheezes and air movement restriction that improved with nebulizer tx. Suspect asthma diagnosis, will start breo (sample given today), prednisone, tessalon, and allergy regimen + mucinex to help reduce current flare. F/u in 2 weeks for lung recheck and spirometry to make a dx. Return precautions reviewed

## 2018-07-25 ENCOUNTER — Other Ambulatory Visit: Payer: Self-pay | Admitting: Unknown Physician Specialty

## 2018-07-25 ENCOUNTER — Ambulatory Visit
Admission: RE | Admit: 2018-07-25 | Discharge: 2018-07-25 | Disposition: A | Discharge status: Home / Self Care | Payer: Medicare Other | Source: Ambulatory Visit | Attending: Family Medicine | Admitting: Family Medicine

## 2018-07-25 ENCOUNTER — Ambulatory Visit: Payer: Medicare Other | Admitting: Family Medicine

## 2018-07-25 ENCOUNTER — Other Ambulatory Visit: Payer: Self-pay

## 2018-07-25 ENCOUNTER — Encounter: Payer: Self-pay | Admitting: Family Medicine

## 2018-07-25 VITALS — BP 138/83 | HR 73 | Temp 98.8°F | Ht 66.0 in | Wt 237.0 lb

## 2018-07-25 DIAGNOSIS — R058 Other specified cough: Secondary | ICD-10-CM

## 2018-07-25 DIAGNOSIS — R05 Cough: Secondary | ICD-10-CM

## 2018-07-25 DIAGNOSIS — I517 Cardiomegaly: Secondary | ICD-10-CM | POA: Diagnosis not present

## 2018-07-25 MED ORDER — AZITHROMYCIN 250 MG PO TABS: ORAL_TABLET | ORAL | 0 refills | Status: DC

## 2018-07-25 NOTE — Progress Notes (Signed)
BP 138/83   Pulse 73   Temp 98.8 F (37.1 C) (Oral)   Ht _0  (1.676 m)   Wt 237 lb (107.5 kg)   LMP 12/15/1991 (Approximate)   SpO2 96%   BMI 38.25 kg/m    Subjective:    Patient ID: Tiffany Delacruz, female    DOB: 05-26-1951, 67 y.o.   MRN: [id number]  HPI: Tiffany Delacruz is a 66 y.o. female  Chief Complaint  Patient presents with  . Cough    f/u pt states would like to have a breathing test   Here today for wheezing, cough follow up. Has been faithfully taking her breo sample inhaler, using mucinex, completed prednisone, and feels some better but still having significant coughing and some DOE. Cough is now productive of thick mucus. Denies fevers, wheezing, sore throat, ear pain, N/V/D, CP.   Relevant past medical, surgical, family and social history reviewed and updated as indicated. Interim medical history since our last visit reviewed. Allergies and medications reviewed and updated.  Review of Systems  Per HPI unless specifically indicated above     Objective:    BP 138/83   Pulse 73   Temp 98.8 F (37.1 C) (Oral)   Ht _1  (1.676 m)   Wt 237 lb (107.5 kg)   LMP 12/15/1991 (Approximate)   SpO2 96%   BMI 38.25 kg/m   Wt Readings from Last 3 Encounters:  07/25/18 237 lb (107.5 kg)  07/11/18 237 lb (107.5 kg)  05/13/18 235 lb (106.6 kg)    Physical Exam  Constitutional: She is oriented to person, place, and time. She appears well-developed and well-nourished. No distress.  HENT:  Head: Atraumatic.  Right Ear: External ear normal.  Left Ear: External ear normal.  Nose: Nose normal.  Oropharynx mildly erythematous  Eyes: Conjunctivae and EOM are normal.  Neck: Normal range of motion. Neck supple.  Cardiovascular: Normal rate and regular rhythm.  Pulmonary/Chest: Effort normal. No respiratory distress. She has no wheezes. She has rales (b/l possible crackles at the base).  Musculoskeletal: Normal range of motion.  Neurological: She is alert and  oriented to person, place, and time.  Skin: Skin is warm and dry.  Psychiatric: She has a normal mood and affect. Her behavior is normal.  Nursing note and vitals reviewed.   Results for orders placed or performed in visit on 05/13/18  TSH  Result Value Ref Range   TSH 1.410 0.450 - 4.500 uIU/mL  Lipid Profile  Result Value Ref Range   Cholesterol, Total 198 100 - 199 mg/dL   Triglycerides 136 0 - 149 mg/dL   HDL 55 >39 mg/dL   VLDL Cholesterol Cal 27 5 - 40 mg/dL   LDL Calculated 116 (H) 0 - 99 mg/dL   Chol/HDL Ratio 3.6 0.0 - 4.4 ratio  Comp Met (CMET)  Result Value Ref Range   Glucose 78 65 - 99 mg/dL   BUN 15 8 - 27 mg/dL   Creatinine, Ser 0.86 0.57 - 1.00 mg/dL   GFR calc non Af Amer 69 >59 mL/min/1.73   GFR calc Af Amer 80 >59 mL/min/1.73   BUN/Creatinine Ratio 17 12 - 28   Sodium 143 134 - 144 mmol/L   Potassium 4.3 3.5 - 5.2 mmol/L   Chloride 102 96 - 106 mmol/L   CO2 27 20 - 29 mmol/L   Calcium 9.2 8.7 - 10.3 mg/dL   Total Protein 6.5 6.0 - 8.5 g/dL   Albumin 4.3 3.6 -  4.8 g/dL   Globulin, Total 2.2 1.5 - 4.5 g/dL   Albumin/Globulin Ratio 2.0 1.2 - 2.2   Bilirubin Total 1.0 0.0 - 1.2 mg/dL   Alkaline Phosphatase 66 39 - 117 IU/L   AST 10 0 - 40 IU/L   ALT 9 0 - 32 IU/L      Assessment & Plan:   Problem List Items Addressed This Visit    None    Visit Diagnoses    Productive cough    -  Primary   Will obtain CXR, start zpak given duration and worsening chest congestion. Continue cough suppressants and mucinex. Follow up if worsening   Relevant Orders   DG Chest 2 View (Completed)       Follow up plan: Return for as scheduled with Jolene, spirometry if better.

## 2018-08-05 ENCOUNTER — Emergency Department
Admission: EM | Admit: 2018-08-05 | Discharge: 2018-08-05 | Disposition: A | Discharge status: Home / Self Care | Payer: Medicare Other | Attending: Emergency Medicine | Admitting: Emergency Medicine

## 2018-08-05 ENCOUNTER — Other Ambulatory Visit: Payer: Self-pay

## 2018-08-05 ENCOUNTER — Encounter: Payer: Self-pay | Admitting: Emergency Medicine

## 2018-08-05 ENCOUNTER — Emergency Department: Payer: Medicare Other

## 2018-08-05 DIAGNOSIS — J40 Bronchitis, not specified as acute or chronic: Secondary | ICD-10-CM

## 2018-08-05 DIAGNOSIS — I10 Essential (primary) hypertension: Secondary | ICD-10-CM | POA: Diagnosis not present

## 2018-08-05 DIAGNOSIS — E039 Hypothyroidism, unspecified: Secondary | ICD-10-CM | POA: Insufficient documentation

## 2018-08-05 DIAGNOSIS — E785 Hyperlipidemia, unspecified: Secondary | ICD-10-CM | POA: Insufficient documentation

## 2018-08-05 DIAGNOSIS — Z79899 Other long term (current) drug therapy: Secondary | ICD-10-CM | POA: Diagnosis not present

## 2018-08-05 DIAGNOSIS — R05 Cough: Secondary | ICD-10-CM | POA: Diagnosis present

## 2018-08-05 DIAGNOSIS — R059 Cough, unspecified: Secondary | ICD-10-CM

## 2018-08-05 LAB — CBC WITH DIFFERENTIAL/PLATELET
ABS IMMATURE GRANULOCYTES: 0.04 10*3/uL (ref 0.00–0.07)
BASOS PCT: 1 %
Basophils Absolute: 0 10*3/uL (ref 0.0–0.1)
Eosinophils Absolute: 0 10*3/uL (ref 0.0–0.5)
Eosinophils Relative: 0 %
HCT: 40.3 % (ref 36.0–46.0)
Hemoglobin: 13.6 g/dL (ref 12.0–15.0)
Immature Granulocytes: 1 %
Lymphocytes Relative: 14 %
Lymphs Abs: 0.9 10*3/uL (ref 0.7–4.0)
MCH: 28.8 pg (ref 26.0–34.0)
MCHC: 33.7 g/dL (ref 30.0–36.0)
MCV: 85.2 fL (ref 80.0–100.0)
MONO ABS: 0.4 10*3/uL (ref 0.1–1.0)
MONOS PCT: 7 %
NEUTROS ABS: 5 10*3/uL (ref 1.7–7.7)
Neutrophils Relative %: 77 %
PLATELETS: 256 10*3/uL (ref 150–400)
RBC: 4.73 MIL/uL (ref 3.87–5.11)
RDW: 13.5 % (ref 11.5–15.5)
WBC: 6.3 10*3/uL (ref 4.0–10.5)
nRBC: 0 % (ref 0.0–0.2)

## 2018-08-05 LAB — COMPREHENSIVE METABOLIC PANEL
ALK PHOS: 64 U/L (ref 38–126)
ALT: 30 U/L (ref 0–44)
AST: 32 U/L (ref 15–41)
Albumin: 4.2 g/dL (ref 3.5–5.0)
Anion gap: 9 (ref 5–15)
BUN: 19 mg/dL (ref 8–23)
CALCIUM: 8.7 mg/dL — AB (ref 8.9–10.3)
CHLORIDE: 100 mmol/L (ref 98–111)
CO2: 28 mmol/L (ref 22–32)
CREATININE: 0.64 mg/dL (ref 0.44–1.00)
Glucose, Bld: 135 mg/dL — ABNORMAL HIGH (ref 70–99)
Potassium: 3.8 mmol/L (ref 3.5–5.1)
Sodium: 137 mmol/L (ref 135–145)
TOTAL PROTEIN: 7.6 g/dL (ref 6.5–8.1)
Total Bilirubin: 1.2 mg/dL (ref 0.3–1.2)

## 2018-08-05 LAB — TROPONIN I

## 2018-08-05 MED ORDER — ALBUTEROL SULFATE HFA 108 (90 BASE) MCG/ACT IN AERS: 2.0000 | INHALATION_SPRAY | Freq: Four times a day (QID) | RESPIRATORY_TRACT | 2 refills | Status: DC | PRN

## 2018-08-05 MED ORDER — ALBUTEROL SULFATE (2.5 MG/3ML) 0.083% IN NEBU
5.0000 mg | INHALATION_SOLUTION | Freq: Once | RESPIRATORY_TRACT | Status: AC
  Administered 2018-08-05: 5 mg via RESPIRATORY_TRACT
  Filled 2018-08-05: qty 6

## 2018-08-05 NOTE — Discharge Instructions (Addendum)
Please seek medical attention for any high fevers, chest pain, shortness of breath, change in behavior, persistent vomiting, bloody stool or any other new or concerning symptoms.  

## 2018-08-05 NOTE — ED Triage Notes (Signed)
Says she has been sick for 5 weeks with cough/sob and has been seeking medical care.  Here for shortness of breath.

## 2018-08-05 NOTE — ED Triage Notes (Signed)
Cough x 5 weeks, has been treated by MD with BREO, prednisone and benzonatate and albuterol inhaler during that time with some relief. Seen 2 days at [facility] and started on amoxicillin/clav and more prednisone.

## 2018-08-05 NOTE — ED Notes (Signed)
Pt reports a cough and SOB for 5 weeks. She has had 2 rounds of antibiotics and steroids without any relief. Feels worse today than ever. NO fever present or reported.

## 2018-08-05 NOTE — ED Provider Notes (Signed)
[facility] Emergency Department Provider Note   ____________________________________________   I have reviewed the triage vital signs and the nursing notes.   HISTORY  Chief Complaint Cough   History limited by: Not Limited   HPI Tiffany Delacruz is a 67 y.o. female who presents to the emergency department today because of concerns for persistent cough.  Patient states she is now had a cough for over 1 month.  She has been her primary care doctor and is been put on multiple rounds of medications.  She is currently using steroids and antibiotics.  She states that she has had a persistent cough through these treatments.  She feels like she has wheezing in her chest and up to her throat.  She denies any recent fevers.  Denies smoking.  Per medical record review patient has a history of GERD, depression, HLD, HTN.   Past Medical History:  Diagnosis Date  . Anxiety   . Depression   . GERD (gastroesophageal reflux disease)   . Graves disease   . Hyperlipidemia   . Hypertension   . Hypothyroidism   . Obesity   . Osteoarthritis   . Sleep apnea    NO CPAP    Patient Active Problem List   Diagnosis Date Noted  . Wheezing 07/14/2018  . Other allergic rhinitis 11/12/2015  . Graves disease 04/15/2015  . Osteoarthritis 04/15/2015  . Hypothyroidism 04/15/2015  . Hypertension 04/15/2015  . Hyperlipidemia 04/15/2015  . Depression 04/15/2015  . Acute anxiety 04/15/2015  . GERD (gastroesophageal reflux disease) 04/15/2015  . Obesity 04/15/2015    Past Surgical History:  Procedure Laterality Date  . BACK SURGERY  1988  . CARPAL TUNNEL RELEASE Left 07/20/2017   Procedure: CARPAL TUNNEL RELEASE;  Surgeon: Dereck Leep, MD;  Location: [facility] ORS;  Service: Orthopedics;  Laterality: Left;  . cataracts surgery    . COLONOSCOPY    . disk repair    . JOINT REPLACEMENT Right 05/2013   knee replacement    Prior to Admission medications   Medication Sig Start  Date End Date Taking? Authorizing Provider  azithromycin (ZITHROMAX) 250 MG tablet Take 2 tabs day one, then 1 daily until complete 07/25/18   Volney American, PA-C  benzonatate (TESSALON) 200 MG capsule Take 1 capsule (200 mg total) by mouth 3 (three) times daily as needed. 07/11/18   Volney American, PA-C  famotidine (PEPCID) 20 MG tablet Take 20 mg by mouth daily after lunch.     [provider]  fluticasone (FLONASE) 50 MCG/ACT nasal spray Place 2 sprays into both nostrils daily. 05/13/18   Trinna Post, PA-C  levothyroxine (SYNTHROID, LEVOTHROID) 175 MCG tablet TAKE 1 TABLET (175 MCG TOTAL) BY MOUTH DAILY BEFORE BREAKFAST. 06/03/18   Johnson, Megan P, DO  losartan (COZAAR) 100 MG tablet Take 1 tablet (100 mg total) by mouth daily. 05/13/18   Trinna Post, PA-C  PROAIR HFA 108 [phone] Base) MCG/ACT inhaler TAKE 2 PUFFS BY MOUTH EVERY 6 HOURS AS NEEDED FOR WHEEZE OR SHORTNESS OF BREATH 07/25/18   Kathrine Haddock, NP    Allergies Patient has no known allergies.  Family History  Problem Relation Age of Onset  . Cancer Mother        uterine and colon  . Heart attack Father   . Heart disease Father   . Alcohol abuse Father   . Diabetes Maternal Grandmother     Social History Social History   Tobacco Use  . Smoking  status: Never Smoker  . Smokeless tobacco: Never Used  Substance Use Topics  . Alcohol use: No    Alcohol/week: 0.0 standard drinks  . Drug use: No    Review of Systems Constitutional: No fever/chills Eyes: No visual changes. ENT: No sore throat. Cardiovascular: Denies chest pain. Respiratory: Positive for cough. Gastrointestinal: No abdominal pain.  No nausea, no vomiting.  No diarrhea.   Genitourinary: Negative for dysuria. Musculoskeletal: Negative for back pain. Skin: Negative for rash. Neurological: Negative for headaches, focal weakness or numbness.  ____________________________________________   PHYSICAL EXAM:  VITAL  SIGNS: ED Triage Vitals  Enc Vitals Group     BP 08/05/18 1508 (!) 171/101     Pulse Rate 08/05/18 1508 94     Resp 08/05/18 1508 20     Temp 08/05/18 1508 98.4 F (36.9 C)     Temp Source 08/05/18 1508 Oral     SpO2 08/05/18 1508 97 %     Weight 08/05/18 1509 235 lb (106.6 kg)     Height 08/05/18 1509 5\' 6"  (1.676 m)     Head Circumference --      Peak Flow --      Pain Score 08/05/18 1509 3   Constitutional: Alert and oriented.  Eyes: Conjunctivae are normal.  ENT      Head: Normocephalic and atraumatic.      Nose: No congestion/rhinnorhea.      Mouth/Throat: Mucous membranes are moist.      Neck: No stridor. Hematological/Lymphatic/Immunilogical: No cervical lymphadenopathy. Cardiovascular: Normal rate, regular rhythm.  No murmurs, rubs, or gallops. Respiratory: Normal respiratory effort without tachypnea nor retractions. Occasional dry sounding cough. Diffuse wheezing.  Gastrointestinal: Soft and non tender. No rebound. No guarding.  Genitourinary: Deferred Musculoskeletal: Normal range of motion in all extremities. No lower extremity edema. Neurologic:  Normal speech and language. No gross focal neurologic deficits are appreciated.  Skin:  Skin is warm, dry and intact. No rash noted. Psychiatric: Mood and affect are normal. Speech and behavior are normal. Patient exhibits appropriate insight and judgment.  ____________________________________________    LABS (pertinent positives/negatives)  CBC wbc 6.3, hgb 13.6, plt 256 Trop <0.03 CMP glu 135, ca 8.7  ____________________________________________   EKG  I, Nance Pear, attending physician, personally viewed and interpreted this EKG  EKG Time: 1517 Rate: 91 Rhythm: normal sinus rhythm Axis: normal Intervals: qtc 464 QRS: narrow ST changes: no st elevation Impression: normal ekg  ____________________________________________    RADIOLOGY  CXR No acute  findings  ____________________________________________   PROCEDURES  Procedures  ____________________________________________   INITIAL IMPRESSION / ASSESSMENT AND PLAN / ED COURSE  Pertinent labs & imaging results that were available during my care of the patient were reviewed by me and considered in my medical decision making (see chart for details).   Patient presented to the emergency department today because of continued cough. Patient on steroids and abx by PCP. On exam some diffuse expiratory wheezing and occasional dry cough. Patient stated she did feel better after duoneb treatment. CXR without pneumonia or ptx. At this point I doubt PE. Given longevity of symptoms I think bronchitis likely. Will give patient prescription for inhaler refill. Discussed importance of continued PCP follow up.  ____________________________________________   FINAL CLINICAL IMPRESSION(S) / ED DIAGNOSES  Final diagnoses:  Cough  Bronchitis     Note: This dictation was prepared with Dragon dictation. Any transcriptional errors that result from this process are unintentional     Nance Pear, MD 08/05/18 1740

## 2018-08-11 ENCOUNTER — Other Ambulatory Visit: Payer: Self-pay | Admitting: Physician Assistant

## 2018-08-11 DIAGNOSIS — I10 Essential (primary) hypertension: Secondary | ICD-10-CM

## 2018-08-12 NOTE — Telephone Encounter (Signed)
Refill request approved.  Has appointment 08/13/18.

## 2018-08-13 ENCOUNTER — Ambulatory Visit: Payer: Medicare Other | Admitting: Nurse Practitioner

## 2018-08-13 ENCOUNTER — Encounter: Payer: Self-pay | Admitting: Nurse Practitioner

## 2018-08-13 VITALS — BP 137/86 | HR 73 | Temp 98.3°F | Ht 66.0 in | Wt 233.4 lb

## 2018-08-13 DIAGNOSIS — I10 Essential (primary) hypertension: Secondary | ICD-10-CM

## 2018-08-13 DIAGNOSIS — J40 Bronchitis, not specified as acute or chronic: Secondary | ICD-10-CM | POA: Diagnosis not present

## 2018-08-13 DIAGNOSIS — E039 Hypothyroidism, unspecified: Secondary | ICD-10-CM | POA: Diagnosis not present

## 2018-08-13 MED ORDER — LOSARTAN POTASSIUM 100 MG PO TABS: 100.0000 mg | ORAL_TABLET | Freq: Every day | ORAL | 3 refills | Status: DC

## 2018-08-13 MED ORDER — FLUOXETINE HCL 20 MG PO TABS: 20.0000 mg | ORAL_TABLET | Freq: Every day | ORAL | 3 refills | Status: DC

## 2018-08-13 NOTE — Patient Instructions (Signed)
Probiotics What are probiotics? Probiotics are the good bacteria and yeasts that live in your body and keep you and your digestive system healthy. Probiotics also help your body's defense (immune) system and protect your body against bad bacterial growth. Certain foods contain probiotics, such as yogurt. Probiotics can also be purchased as a supplement. As with any supplement or drug, it is important to discuss its use with your health care provider. What affects the balance of bacteria in my body? The balance of bacteria in your body can be affected by:  Antibiotic medicines. Antibiotics are sometimes necessary to treat infection. Unfortunately, they may kill good or friendly bacteria in your body as well as the bad bacteria. This may lead to stomach problems like diarrhea, gas, and cramping.  Disease. Some conditions are the result of an overgrowth of bad bacteria, yeasts, parasites, or fungi. These conditions include: ? Infectious diarrhea. ? Stomach and respiratory infections. ? Skin infections. ? Irritable bowel syndrome (IBS). ? Inflammatory bowel diseases. ? Ulcer due to Helicobacter pylori (H. pylori) infection. ? Tooth decay and periodontal disease. ? Vaginal infections.  Stress and poor diet may also lower the good bacteria in your body. What type of probiotic is right for me? Probiotics are available over the counter at your local pharmacy, health food, or grocery store. They come in many different forms, combinations of strains, and dosing strengths. Some may need to be refrigerated. Always read the label for storage and usage instructions. Specific strains have been shown to be more effective for certain conditions. Ask your health care provider what option is best for you. Why would I need probiotics? There are many reasons your health care provider might recommend a probiotic supplement, including:  Diarrhea.  Constipation.  IBS.  Respiratory infections.  Yeast  infections.  Acne, eczema, and other skin conditions.  Frequent urinary tract infections (UTIs).  Are there side effects of probiotics? Some people experience mild side effects when taking probiotics. Side effects are usually temporary and may include:  Gas.  Bloating.  Cramping.  Rarely, serious side effects, such as infection or immune system changes, may occur. What else do I need to know about probiotics?  There are many different strains of probiotics. Certain strains may be more effective depending on your condition. Probiotics are available in varying doses. Ask your health care provider which probiotic you should use and how often.  If you are taking probiotics along with antibiotics, it is generally recommended to wait at least 2 hours between taking the antibiotic and taking the probiotic. For more information: [facility] for Complementary and Alternative Medicine [email or web address] This information is not intended to replace advice given to you by your health care provider. Make sure you discuss any questions you have with your health care provider. Document Released: 04/08/2014 Document Revised: 08/08/2016 Document Reviewed: 12/09/2013 Elsevier Interactive Patient Education  2017 [employer].

## 2018-08-13 NOTE — Assessment & Plan Note (Addendum)
Chronic, continue current Levo dose 175 MCG and obtain TSH today.

## 2018-08-13 NOTE — Assessment & Plan Note (Signed)
Ongoing, but improving.  Currently has GI symptoms from abx and prednisone treatments.  Recommended taking probiotic tablet, one tablet twice a day, for 30 days.  She will be obtaining OTC today.

## 2018-08-13 NOTE — Progress Notes (Signed)
BP 137/86 (BP Location: Left Arm, Patient Position: Sitting, Cuff Size: Large)   Pulse 73   Temp 98.3 F (36.8 C)   Ht 5\' 6"  (1.676 m)   Wt 233 lb 6 oz (105.9 kg)   LMP 12/15/1991 (Approximate)   SpO2 96%   BMI 37.67 kg/m    Subjective:    Patient ID: Tiffany Delacruz, female    DOB: [birth year], 67 y.o.   MRN: [id number]  HPI: Tiffany Delacruz is a 67 y.o. female presents hypothyroid, hypertension, and URI  Chief Complaint  Patient presents with  . Hypothyroidism  . Hypertension  . URI    X 6 weeks   HYPERTENSION Losartan increased to 100 MG in August. Currently getting over URI. Hypertension status: stable  Satisfied with current treatment? yes Duration of hypertension: chronic BP monitoring frequency:  a few times a week BP range: 140/80's BP medication side effects:  no Medication compliance: good compliance Previous BP meds: none Aspirin: no Recurrent headaches: no Visual changes: no Palpitations: no Dyspnea: no Chest pain: no Lower extremity edema: no Dizzy/lightheaded: no   HYPOTHYROIDISM Current Levothyroxine dose 175 MCG daily. Thyroid control status:stable Satisfied with current treatment? yes Medication side effects: no Medication compliance: good compliance Etiology of hypothyroidism:  Recent dose adjustment:no Fatigue: yes, recent illness Cold intolerance: no Heat intolerance: no Weight gain: yes, recent illness and prednisone Weight loss: no Constipation: no Diarrhea/loose stools: yes, recent abx treatment and prednisone Palpitations: no Lower extremity edema: no Anxiety/depressed mood: no   URI: Initial presentation for URI was 07/11/18 to clinic at which time she reported symptoms x 2 weeks.  Started on Breo, prednisone, and tessalon + allergy medications.  She returned to clinic 07/25/18 with similar presentation and was started on Zpak and CXR done showing no acute cardiopulmonary disease.  She then presented to [facility] care on 08/03/18  with similar presentation.  Was placed on prednisone taper and Augmentin at that time.  Then presented to ER 11/11/9 with repeat CXR done noting chronic accentuation of right basilar markings without infiltrate.  Was provided inhaler refill.  Continues to use Albuterol inhaler at home and states this helps SOB and wheezing.  Has only had similar presentation a couple times in the past and not lasted this length of time.  No childhood h/o asthma.  Non smoker.  Does not live with anyone who smokes.  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  She currently lives in a new home, suite built onto her daughter's house.  Lives with daughter and her children, both children and her daughter have asthma and have had recent URI.  Symptoms she reports have improved, cough is "almost gone".  Wheezing has improved.  Reports the abx and prednisone have "messed her stomach up" with diarrhea, has not been taking probiotic.     Relevant past medical, surgical, family and social history reviewed and updated as indicated. Interim medical history since our last visit reviewed. Allergies and medications reviewed and updated.  Review of Systems  Constitutional: Positive for fatigue. Negative for activity change, appetite change, chills, diaphoresis and fever.  Respiratory: Positive for cough and wheezing. Negative for chest tightness and shortness of breath.        Cough and wheezing have improved per patient report  Cardiovascular: Negative for chest pain, palpitations and leg swelling.  Gastrointestinal: Negative for abdominal distention, abdominal pain, constipation, diarrhea, nausea and vomiting.  Endocrine: Negative for cold intolerance, heat intolerance, polydipsia,  polyphagia and polyuria.  Musculoskeletal: Negative for arthralgias and myalgias.  Skin: Negative.   Neurological: Negative for dizziness, tremors, syncope, weakness, light-headedness, numbness and headaches.    Psychiatric/Behavioral: Negative.     Per HPI unless specifically indicated above     Objective:    BP 137/86 (BP Location: Left Arm, Patient Position: Sitting, Cuff Size: Large)   Pulse 73   Temp 98.3 F (36.8 C)   Ht 5\' 6"  (1.676 m)   Wt 233 lb 6 oz (105.9 kg)   LMP 12/15/1991 (Approximate)   SpO2 96%   BMI 37.67 kg/m   Wt Readings from Last 3 Encounters:  08/13/18 233 lb 6 oz (105.9 kg)  08/05/18 235 lb (106.6 kg)  07/25/18 237 lb (107.5 kg)    Physical Exam  Constitutional: She is oriented to person, place, and time. She appears well-developed and well-nourished.  HENT:  Head: Normocephalic and atraumatic.  Right Ear: Hearing, tympanic membrane, external ear and ear canal normal.  Left Ear: Hearing, tympanic membrane, external ear and ear canal normal.  Nose: Rhinorrhea present. Right sinus exhibits no maxillary sinus tenderness and no frontal sinus tenderness. Left sinus exhibits no maxillary sinus tenderness and no frontal sinus tenderness.  Mouth/Throat: Uvula is midline, oropharynx is clear and moist and mucous membranes are normal.  Eyes: Pupils are equal, round, and reactive to light. Conjunctivae and EOM are normal. Right eye exhibits no discharge. Left eye exhibits no discharge.  Neck: Normal range of motion. Neck supple. No JVD present. Carotid bruit is not present. No thyromegaly present.  Cardiovascular: Normal rate, regular rhythm and normal heart sounds.  Pulmonary/Chest: Effort normal. She has wheezes.  Intermittent expiratory wheezes, cleared with coughing.  Intermittent nonproductive cough present.  Abdominal: Soft. Bowel sounds are normal. She exhibits no distension. There is no tenderness.  Lymphadenopathy:    She has no cervical adenopathy.  Neurological: She is alert and oriented to person, place, and time.  Skin: Skin is warm and dry.  Psychiatric: She has a normal mood and affect. Her behavior is normal. Judgment and thought content normal.   Nursing note and vitals reviewed.   Results for orders placed or performed during the hospital encounter of 08/05/18  Troponin I - ONCE - STAT  Result Value Ref Range   Troponin I <0.03 <0.03 ng/mL  CBC with Differential  Result Value Ref Range   WBC 6.3 4.0 - 10.5 K/uL   RBC 4.73 3.87 - 5.11 MIL/uL   Hemoglobin 13.6 12.0 - 15.0 g/dL   HCT 40.3 36.0 - 46.0 %   MCV 85.2 80.0 - 100.0 fL   MCH 28.8 26.0 - 34.0 pg   MCHC 33.7 30.0 - 36.0 g/dL   RDW 13.5 11.5 - 15.5 %   Platelets 256 150 - 400 K/uL   nRBC 0.0 0.0 - 0.2 %   Neutrophils Relative % 77 %   Neutro Abs 5.0 1.7 - 7.7 K/uL   Lymphocytes Relative 14 %   Lymphs Abs 0.9 0.7 - 4.0 K/uL   Monocytes Relative 7 %   Monocytes Absolute 0.4 0.1 - 1.0 K/uL   Eosinophils Relative 0 %   Eosinophils Absolute 0.0 0.0 - 0.5 K/uL   Basophils Relative 1 %   Basophils Absolute 0.0 0.0 - 0.1 K/uL   Immature Granulocytes 1 %   Abs Immature Granulocytes 0.04 0.00 - 0.07 K/uL  Comprehensive metabolic panel  Result Value Ref Range   Sodium 137 135 - 145 mmol/L   Potassium  3.8 3.5 - 5.1 mmol/L   Chloride 100 98 - 111 mmol/L   CO2 28 22 - 32 mmol/L   Glucose, Bld 135 (H) 70 - 99 mg/dL   BUN 19 8 - 23 mg/dL   Creatinine, Ser 0.64 0.44 - 1.00 mg/dL   Calcium 8.7 (L) 8.9 - 10.3 mg/dL   Total Protein 7.6 6.5 - 8.1 g/dL   Albumin 4.2 3.5 - 5.0 g/dL   AST 32 15 - 41 U/L   ALT 30 0 - 44 U/L   Alkaline Phosphatase 64 38 - 126 U/L   Total Bilirubin 1.2 0.3 - 1.2 mg/dL   GFR calc non Af Amer >60 >60 mL/min   GFR calc Af Amer >60 >60 mL/min   Anion gap 9 5 - 15      Assessment & Plan:   Problem List Items Addressed This Visit      Cardiovascular and Mediastinum   Hypertension - Primary    Chronic, ongoing.  Continue current regimen.  Recheck BP in 3 months, once illness improved.      Relevant Medications   losartan (COZAAR) 100 MG tablet     Respiratory   Bronchitis    Ongoing, but improving.  Currently has GI symptoms from abx  and prednisone treatments.  Recommended taking probiotic tablet, one tablet twice a day, for 30 days.  She will be obtaining OTC today.        Endocrine   Hypothyroidism    Chronic, continue current Levo dose 175 MCG and obtain TSH today.      Relevant Orders   TSH       Follow up plan: Return in about 3 months (around 11/13/2018) for HTN and Hypothyroid.

## 2018-08-13 NOTE — Assessment & Plan Note (Signed)
Chronic, ongoing.  Continue current regimen.  Recheck BP in 3 months, once illness improved.

## 2018-08-14 LAB — TSH: TSH: 1.78 u[IU]/mL (ref 0.450–4.500)

## 2018-10-10 HISTORY — PX: EYE SURGERY: SHX253

## 2018-11-01 ENCOUNTER — Telehealth: Payer: Self-pay | Admitting: Unknown Physician Specialty

## 2018-11-01 NOTE — Telephone Encounter (Unsigned)
Copied from [place] [id number]. Topic: Medicare AWV >> Nov 01, 2018  3:36 PM Sherren Kerns wrote: Called to schedule Medicare Annual Wellness Visit with the Nurse Health Advisor. No answer at Home number and no machine for a message.  Left message on Cell voicemail.  If patient returns call, please note: their last AWV was on 11/09/2017, please schedule AWV-s with [facility] any date AFTER 11/09/2018.  Thank you! For any questions please contact: Janace Hoard at [phone] or Skype [email or web address]

## 2018-11-13 ENCOUNTER — Ambulatory Visit: Payer: Medicare Other | Admitting: Nurse Practitioner

## 2018-11-14 ENCOUNTER — Encounter: Payer: Self-pay | Admitting: Nurse Practitioner

## 2018-11-14 ENCOUNTER — Ambulatory Visit (INDEPENDENT_AMBULATORY_CARE_PROVIDER_SITE_OTHER): Payer: Medicare Other | Admitting: Nurse Practitioner

## 2018-11-14 ENCOUNTER — Ambulatory Visit (INDEPENDENT_AMBULATORY_CARE_PROVIDER_SITE_OTHER): Payer: Medicare Other

## 2018-11-14 VITALS — BP 131/83 | HR 75 | Temp 97.7°F | Resp 17 | Ht 66.0 in | Wt 237.8 lb

## 2018-11-14 DIAGNOSIS — I10 Essential (primary) hypertension: Secondary | ICD-10-CM | POA: Diagnosis not present

## 2018-11-14 DIAGNOSIS — E785 Hyperlipidemia, unspecified: Secondary | ICD-10-CM | POA: Diagnosis not present

## 2018-11-14 DIAGNOSIS — Z0000 Encounter for general adult medical examination without abnormal findings: Secondary | ICD-10-CM | POA: Diagnosis not present

## 2018-11-14 MED ORDER — HYDROCHLOROTHIAZIDE 12.5 MG PO TABS: 12.5000 mg | ORAL_TABLET | Freq: Every day | ORAL | 3 refills | Status: DC

## 2018-11-14 NOTE — Patient Instructions (Signed)
Tiffany Delacruz , Thank you for taking time to come for your Medicare Wellness Visit. I appreciate your ongoing commitment to your health goals. Please review the following plan we discussed and let me know if I can assist you in the future.   Screening recommendations/referrals: Colonoscopy: completed 08/29/2012- declined Mammogram: due now- declined Bone Density: due now- declined Recommended yearly ophthalmology/optometry visit for glaucoma screening and checkup Recommended yearly dental visit for hygiene and checkup  Vaccinations: Influenza vaccine: due now- declined Pneumococcal vaccine: up to date Tdap vaccine: up to date Shingles vaccine: shingrix eligible, check with your insurance company for coverage     Advanced directives: Please bring a copy of your health care power of attorney and living will to the office at your convenience.  Conditions/risks identified: n/a  Next appointment: Follow up in one year for your annual wellness visit.    Preventive Care 29 Years and Older, Female Preventive care refers to lifestyle choices and visits with your health care provider that can promote health and wellness. What does preventive care include?  A yearly physical exam. This is also called an annual well check.  Dental exams once or twice a year.  Routine eye exams. Ask your health care provider how often you should have your eyes checked.  Personal lifestyle choices, including:  Daily care of your teeth and gums.  Regular physical activity.  Eating a healthy diet.  Avoiding tobacco and drug use.  Limiting alcohol use.  Practicing safe sex.  Taking low-dose aspirin every day.  Taking vitamin and mineral supplements as recommended by your health care provider. What happens during an annual well check? The services and screenings done by your health care provider during your annual well check will depend on your age, overall health, lifestyle risk factors, and family  history of disease. Counseling  Your health care provider may ask you questions about your:  Alcohol use.  Tobacco use.  Drug use.  Emotional well-being.  Home and relationship well-being.  Sexual activity.  Eating habits.  History of falls.  Memory and ability to understand (cognition).  Work and work Statistician.  Reproductive health. Screening  You may have the following tests or measurements:  Height, weight, and BMI.  Blood pressure.  Lipid and cholesterol levels. These may be checked every 5 years, or more frequently if you are over 41 years old.  Skin check.  Lung cancer screening. You may have this screening every year starting at age 51 if you have a 30-pack-year history of smoking and currently smoke or have quit within the past 15 years.  Fecal occult blood test (FOBT) of the stool. You may have this test every year starting at age 65.  Flexible sigmoidoscopy or colonoscopy. You may have a sigmoidoscopy every 5 years or a colonoscopy every 10 years starting at age 91.  Hepatitis C blood test.  Hepatitis B blood test.  Sexually transmitted disease (STD) testing.  Diabetes screening. This is done by checking your blood sugar (glucose) after you have not eaten for a while (fasting). You may have this done every 1-3 years.  Bone density scan. This is done to screen for osteoporosis. You may have this done starting at age 68.  Mammogram. This may be done every 1-2 years. Talk to your health care provider about how often you should have regular mammograms. Talk with your health care provider about your test results, treatment options, and if necessary, the need for more tests. Vaccines  Your health care  provider may recommend certain vaccines, such as:  Influenza vaccine. This is recommended every year.  Tetanus, diphtheria, and acellular pertussis (Tdap, Td) vaccine. You may need a Td booster every 10 years.  Zoster vaccine. You may need this after  age 26.  Pneumococcal 13-valent conjugate (PCV13) vaccine. One dose is recommended after age 54.  Pneumococcal polysaccharide (PPSV23) vaccine. One dose is recommended after age 72. Talk to your health care provider about which screenings and vaccines you need and how often you need them. This information is not intended to replace advice given to you by your health care provider. Make sure you discuss any questions you have with your health care provider. Document Released: 10/08/2015 Document Revised: 05/31/2016 Document Reviewed: 07/13/2015 Elsevier Interactive Patient Education  2017 [place] Prevention in the Home Falls can cause injuries. They can happen to people of all ages. There are many things you can do to make your home safe and to help prevent falls. What can I do on the outside of my home?  Regularly fix the edges of walkways and driveways and fix any cracks.  Remove anything that might make you trip as you walk through a door, such as a raised step or threshold.  Trim any bushes or trees on the path to your home.  Use bright outdoor lighting.  Clear any walking paths of anything that might make someone trip, such as rocks or tools.  Regularly check to see if handrails are loose or broken. Make sure that both sides of any steps have handrails.  Any raised decks and porches should have guardrails on the edges.  Have any leaves, snow, or ice cleared regularly.  Use sand or salt on walking paths during winter.  Clean up any spills in your garage right away. This includes oil or grease spills. What can I do in the bathroom?  Use night lights.  Install grab bars by the toilet and in the tub and shower. Do not use towel bars as grab bars.  Use non-skid mats or decals in the tub or shower.  If you need to sit down in the shower, use a plastic, non-slip stool.  Keep the floor dry. Clean up any water that spills on the floor as soon as it  happens.  Remove soap buildup in the tub or shower regularly.  Attach bath mats securely with double-sided non-slip rug tape.  Do not have throw rugs and other things on the floor that can make you trip. What can I do in the bedroom?  Use night lights.  Make sure that you have a light by your bed that is easy to reach.  Do not use any sheets or blankets that are too big for your bed. They should not hang down onto the floor.  Have a firm chair that has side arms. You can use this for support while you get dressed.  Do not have throw rugs and other things on the floor that can make you trip. What can I do in the kitchen?  Clean up any spills right away.  Avoid walking on wet floors.  Keep items that you use a lot in easy-to-reach places.  If you need to reach something above you, use a strong step stool that has a grab bar.  Keep electrical cords out of the way.  Do not use floor polish or wax that makes floors slippery. If you must use wax, use non-skid floor wax.  Do not have throw  rugs and other things on the floor that can make you trip. What can I do with my stairs?  Do not leave any items on the stairs.  Make sure that there are handrails on both sides of the stairs and use them. Fix handrails that are broken or loose. Make sure that handrails are as long as the stairways.  Check any carpeting to make sure that it is firmly attached to the stairs. Fix any carpet that is loose or worn.  Avoid having throw rugs at the top or bottom of the stairs. If you do have throw rugs, attach them to the floor with carpet tape.  Make sure that you have a light switch at the top of the stairs and the bottom of the stairs. If you do not have them, ask someone to add them for you. What else can I do to help prevent falls?  Wear shoes that:  Do not have high heels.  Have rubber bottoms.  Are comfortable and fit you well.  Are closed at the toe. Do not wear sandals.  If you  use a stepladder:  Make sure that it is fully opened. Do not climb a closed stepladder.  Make sure that both sides of the stepladder are locked into place.  Ask someone to hold it for you, if possible.  Clearly mark and make sure that you can see:  Any grab bars or handrails.  First and last steps.  Where the edge of each step is.  Use tools that help you move around (mobility aids) if they are needed. These include:  Canes.  Walkers.  Scooters.  Crutches.  Turn on the lights when you go into a dark area. Replace any light bulbs as soon as they burn out.  Set up your furniture so you have a clear path. Avoid moving your furniture around.  If any of your floors are uneven, fix them.  If there are any pets around you, be aware of where they are.  Review your medicines with your doctor. Some medicines can make you feel dizzy. This can increase your chance of falling. Ask your doctor what other things that you can do to help prevent falls. This information is not intended to replace advice given to you by your health care provider. Make sure you discuss any questions you have with your health care provider. Document Released: 07/08/2009 Document Revised: 02/17/2016 Document Reviewed: 10/16/2014 Elsevier Interactive Patient Education  2017 [employer].

## 2018-11-14 NOTE — Progress Notes (Signed)
Subjective:   Tiffany Delacruz is a 68 y.o. female who presents for Medicare Annual (Subsequent) preventive examination.  Review of Systems:   Cardiac Risk Factors include: advanced age (>28men, >14 women);hypertension;dyslipidemia;obesity (BMI >30kg/m2)     Objective:     Vitals: BP 131/83 (BP Location: Left Arm, Patient Position: Sitting, Cuff Size: Normal)   Pulse 75   Temp 97.7 F (36.5 C) (Oral)   Resp 17   Ht 5\' 6"  (1.676 m)   Wt 237 lb 12.8 oz (107.9 kg)   LMP 12/15/1991 (Approximate)   SpO2 100%   BMI 38.38 kg/m   Body mass index is 38.38 kg/m.  Advanced Directives 11/14/2018 08/05/2018 11/09/2017 07/17/2017 10/24/2016 10/20/2015  Does Patient Have a Medical Advance Directive? Yes No Yes Yes Yes Yes  Type of Paramedic of [place];Living will - [place];Living will - [place];Living will [place];Living will  Copy of [place] in Chart? No - copy requested - No - copy requested - - -    Tobacco Social History   Tobacco Use  Smoking Status Never Smoker  Smokeless Tobacco Never Used     Counseling given: Not Answered   Clinical Intake:  Pre-visit preparation completed: Yes  Pain : No/denies pain     Nutritional Status: BMI > 30  Obese Nutritional Risks: None Diabetes: No  How often do you need to have someone help you when you read instructions, pamphlets, or other written materials from your doctor or pharmacy?: 1 - Never What is the last grade level you completed in school?: 4 years college - associates with some additional credit   Interpreter Needed?: No  Information entered by :: Tiffany Hill,LPN   Past Medical History:  Diagnosis Date  . Anxiety   . Depression   . GERD (gastroesophageal reflux disease)   . Graves disease   . Hyperlipidemia   . Hypertension   . Hypothyroidism   . Obesity   . Osteoarthritis   . Sleep apnea    NO CPAP  .  Wheezing 07/14/2018   Past Surgical History:  Procedure Laterality Date  . BACK SURGERY  1988  . CARPAL TUNNEL RELEASE Left 07/20/2017   Procedure: CARPAL TUNNEL RELEASE;  Surgeon: Dereck Leep, MD;  Location: [facility] ORS;  Service: Orthopedics;  Laterality: Left;  . cataracts surgery    . COLONOSCOPY    . disk repair    . EYE SURGERY Bilateral 10/10/2018  . JOINT REPLACEMENT Right 05/2013   knee replacement   Family History  Problem Relation Age of Onset  . Uterine cancer Mother   . Colon cancer Mother   . Heart attack Father   . Heart disease Father   . Alcohol abuse Father   . Diabetes Maternal Grandmother    Social History   Socioeconomic History  . Marital status: Widowed    Spouse name: Not on file  . Number of children: Not on file  . Years of education: Not on file  . Highest education level: Associate degree: academic program  Occupational History  . Occupation: retired  Scientific laboratory technician  . Financial resource strain: Not hard at all  . Food insecurity:    Worry: Never true    Inability: Never true  . Transportation needs:    Medical: No    Non-medical: No  Tobacco Use  . Smoking status: Never Smoker  . Smokeless tobacco: Never Used  Substance and Sexual Activity  .  Alcohol use: No    Alcohol/week: 0.0 standard drinks  . Drug use: No  . Sexual activity: Never  Lifestyle  . Physical activity:    Days per week: 0 days    Minutes per session: 0 min  . Stress: Not at all  Relationships  . Social connections:    Talks on phone: More than three times a week    Gets together: More than three times a week    Attends religious service: More than 4 times per year    Active member of club or organization: Yes    Attends meetings of clubs or organizations: More than 4 times per year    Relationship status: Widowed  Other Topics Concern  . Not on file  Social History Narrative  . Not on file    Outpatient Encounter Medications as of 11/14/2018  Medication  Sig  . albuterol (PROVENTIL HFA;VENTOLIN HFA) 108 (90 Base) MCG/ACT inhaler Inhale 2 puffs into the lungs every 6 (six) hours as needed for wheezing or shortness of breath.  . famotidine (PEPCID) 20 MG tablet Take 20 mg by mouth daily after lunch.   Marland Kitchen FLUoxetine (PROZAC) 20 MG tablet Take 1 tablet (20 mg total) by mouth daily.  . fluticasone (FLONASE) 50 MCG/ACT nasal spray Place 2 sprays into both nostrils daily.  Marland Kitchen levothyroxine (SYNTHROID, LEVOTHROID) 175 MCG tablet TAKE 1 TABLET (175 MCG TOTAL) BY MOUTH DAILY BEFORE BREAKFAST.  Marland Kitchen losartan (COZAAR) 100 MG tablet Take 1 tablet (100 mg total) by mouth daily.   No facility-administered encounter medications on file as of 11/14/2018.     Activities of Daily Living In your present state of health, do you have any difficulty performing the following activities: 11/14/2018  Hearing? N  Vision? N  Difficulty concentrating or making decisions? N  Walking or climbing stairs? N  Dressing or bathing? N  Doing errands, shopping? N  Preparing Food and eating ? N  Using the Toilet? N  In the past six months, have you accidently leaked urine? N  Do you have problems with loss of bowel control? N  Managing your Medications? N  Managing your Finances? N  Housekeeping or managing your Housekeeping? N  Some recent data might be hidden    Patient Care Team: Venita Lick, NP as PCP - General (Nurse Practitioner) Kathrine Haddock, NP as PCP - Family Medicine (Nurse Practitioner)    Assessment:   This is a routine wellness examination for [place].  Exercise Activities and Dietary recommendations Current Exercise Habits: Home exercise routine, Time (Minutes): 20, Frequency (Times/Week): 3, Weekly Exercise (Minutes/Week): 60, Intensity: Mild, Exercise limited by: None identified  Goals    . DIET - INCREASE WATER INTAKE     Recommend drinking at least 6-8 glasses of water a day        Fall Risk Fall Risk  11/14/2018 05/13/2018 11/09/2017  10/24/2016 10/20/2015  Falls in the past year? 0 No No No No   FALL RISK PREVENTION PERTAINING TO THE HOME:  Any stairs in or around the home? Yes  If so, are there any without handrails? No   Home free of loose throw rugs in walkways, pet beds, electrical cords, etc? Yes  Adequate lighting in your home to reduce risk of falls? Yes   ASSISTIVE DEVICES UTILIZED TO PREVENT FALLS:  Life alert? No  Use of a cane, walker or w/c? No  Grab bars in the bathroom? No  Shower chair or bench in shower? No  Elevated toilet seat or a handicapped toilet? No   DME ORDERS:  DME order needed?  No   TIMED UP AND GO:  Was the test performed? Yes .  Length of time to ambulate 10 feet: 11 sec.   GAIT:  Appearance of gait: Gait stead-fast without the use of an assistive device.  Education: Fall risk prevention has been discussed.  Intervention(s) required? No    Depression Screen PHQ 2/9 Scores 11/14/2018 08/13/2018 05/13/2018 11/09/2017  PHQ - 2 Score 0 0 0 0  PHQ- 9 Score - 0 - 1     Cognitive Function     6CIT Screen 11/14/2018 11/09/2017  What Year? 0 points 0 points  What month? 0 points 0 points  What time? 0 points 0 points  Count back from 20 0 points 0 points  Months in reverse 0 points 0 points  Repeat phrase 0 points 0 points  Total Score 0 0    Immunization History  Administered Date(s) Administered  . Influenza, High Dose Seasonal PF 07/17/2016  . Influenza,inj,Quad PF,6+ Mos 10/20/2015  . Influenza-Unspecified 07/09/2014  . Pneumococcal Conjugate-13 10/20/2015  . Pneumococcal Polysaccharide-23 06/24/2012, 11/09/2017  . Td 09/25/2001  . Tdap 12/23/2012    Qualifies for Shingles Vaccine? Yes  Zostavax completed n/a. Due for Shingrix. Education has been provided regarding the importance of this vaccine. Pt has been advised to call insurance company to determine out of pocket expense. Advised may also receive vaccine at local pharmacy or Health Dept. Verbalized acceptance  and understanding.  Tdap: up to date  Flu Vaccine: Due for Flu vaccine. Does the patient want to receive this vaccine today?  No . Education has been provided regarding the importance of this vaccine but still declined. Advised may receive this vaccine at local pharmacy or Health Dept. Aware to provide a copy of the vaccination record if obtained from local pharmacy or Health Dept. Verbalized acceptance and understanding.  Pneumococcal Vaccine: up to date   Screening Tests Health Maintenance  Topic Date Due  . INFLUENZA VACCINE  05/27/2019 (Originally 04/25/2018)  . MAMMOGRAM  11/15/2019 (Originally 12/18/2013)  . DEXA SCAN  11/15/2019 (Originally 04/24/2014)  . COLONOSCOPY  08/29/2022  . TETANUS/TDAP  12/24/2022  . Hepatitis C Screening  Completed  . PNA vac Low Risk Adult  Completed    Cancer Screenings:  Colorectal Screening: Completed 08/29/2012. Repeat every 5 years due to family history, patient declined at this time   Mammogram: declined at this time  Bone Density: declined at this time  Lung Cancer Screening: (Low Dose CT Chest recommended if Age 106-80 years, 30 pack-year currently smoking OR have quit w/in 15years.) does not qualify.     Additional Screening:  Hepatitis C Screening: does qualify; Completed 10/20/2015  Vision Screening: Recommended annual ophthalmology exams for early detection of glaucoma and other disorders of the eye. Is the patient up to date with their annual eye exam?  Yes  Who is the provider or what is the name of the office in which the pt attends annual eye exams?[facility]    Dental Screening: Recommended annual dental exams for proper oral hygiene  Community Resource Referral:  CRR required this visit?  No      Plan:    I have personally reviewed and addressed the Medicare Annual Wellness questionnaire and have noted the following in the patient's chart:  A. Medical and social history B. Use of alcohol, tobacco or illicit drugs    C. Current medications and  supplements D. Functional ability and status E.  Nutritional status F.  Physical activity G. Advance directives H. List of other physicians I.  Hospitalizations, surgeries, and ER visits in previous 12 months J.  [place] such as hearing and vision if needed, cognitive and depression L. Referrals and appointments   In addition, I have reviewed and discussed with patient certain preventive protocols, quality metrics, and best practice recommendations. A written personalized care plan for preventive services as well as general preventive health recommendations were provided to patient.   Signed,  Tyler Aas, LPN Nurse Health Advisor   Nurse Notes:none

## 2018-11-14 NOTE — Patient Instructions (Signed)
DASH Eating Plan  DASH stands for "Dietary Approaches to Stop Hypertension." The DASH eating plan is a healthy eating plan that has been shown to reduce high blood pressure (hypertension). It may also reduce your risk for type 2 diabetes, heart disease, and stroke. The DASH eating plan may also help with weight loss.  What are tips for following this plan?    General guidelines   Avoid eating more than 2,300 mg (milligrams) of salt (sodium) a day. If you have hypertension, you may need to reduce your sodium intake to 1,500 mg a day.   Limit alcohol intake to no more than 1 drink a day for nonpregnant women and 2 drinks a day for men. One drink equals 12 oz of beer, 5 oz of wine, or 1 oz of hard liquor.   Work with your health care provider to maintain a healthy body weight or to lose weight. Ask what an ideal weight is for you.   Get at least 30 minutes of exercise that causes your heart to beat faster (aerobic exercise) most days of the week. Activities may include walking, swimming, or biking.   Work with your health care provider or diet and nutrition specialist (dietitian) to adjust your eating plan to your individual calorie needs.  Reading food labels     Check food labels for the amount of sodium per serving. Choose foods with less than 5 percent of the Daily Value of sodium. Generally, foods with less than 300 mg of sodium per serving fit into this eating plan.   To find whole grains, look for the word "whole" as the first word in the ingredient list.  Shopping   Buy products labeled as "low-sodium" or "no salt added."   Buy fresh foods. Avoid canned foods and premade or frozen meals.  Cooking   Avoid adding salt when cooking. Use salt-free seasonings or herbs instead of table salt or sea salt. Check with your health care provider or pharmacist before using salt substitutes.   Do not fry foods. Cook foods using healthy methods such as baking, boiling, grilling, and broiling instead.   Cook with  heart-healthy oils, such as olive, canola, soybean, or sunflower oil.  Meal planning   Eat a balanced diet that includes:  ? 5 or more servings of fruits and vegetables each day. At each meal, try to fill half of your plate with fruits and vegetables.  ? Up to 6-8 servings of whole grains each day.  ? Less than 6 oz of lean meat, poultry, or fish each day. A 3-oz serving of meat is about the same size as a deck of cards. One egg equals 1 oz.  ? 2 servings of low-fat dairy each day.  ? A serving of nuts, seeds, or beans 5 times each week.  ? Heart-healthy fats. Healthy fats called Omega-3 fatty acids are found in foods such as flaxseeds and coldwater fish, like sardines, salmon, and mackerel.   Limit how much you eat of the following:  ? Canned or prepackaged foods.  ? Food that is high in trans fat, such as fried foods.  ? Food that is high in saturated fat, such as fatty meat.  ? Sweets, desserts, sugary drinks, and other foods with added sugar.  ? Full-fat dairy products.   Do not salt foods before eating.   Try to eat at least 2 vegetarian meals each week.   Eat more home-cooked food and less restaurant, buffet, and fast food.     When eating at a restaurant, ask that your food be prepared with less salt or no salt, if possible.  What foods are recommended?  The items listed may not be a complete list. Talk with your dietitian about what dietary choices are best for you.  Grains  Whole-grain or whole-wheat bread. Whole-grain or whole-wheat pasta. Brown rice. Oatmeal. Quinoa. Bulgur. Whole-grain and low-sodium cereals. Pita bread. Low-fat, low-sodium crackers. Whole-wheat flour tortillas.  Vegetables  Fresh or frozen vegetables (raw, steamed, roasted, or grilled). Low-sodium or reduced-sodium tomato and vegetable juice. Low-sodium or reduced-sodium tomato sauce and tomato paste. Low-sodium or reduced-sodium canned vegetables.  Fruits  All fresh, dried, or frozen fruit. Canned fruit in natural juice (without  added sugar).  Meat and other protein foods  Skinless chicken or turkey. Ground chicken or turkey. Pork with fat trimmed off. Fish and seafood. Egg whites. Dried beans, peas, or lentils. Unsalted nuts, nut butters, and seeds. Unsalted canned beans. Lean cuts of beef with fat trimmed off. Low-sodium, lean deli meat.  Dairy  Low-fat (1%) or fat-free (skim) milk. Fat-free, low-fat, or reduced-fat cheeses. Nonfat, low-sodium ricotta or cottage cheese. Low-fat or nonfat yogurt. Low-fat, low-sodium cheese.  Fats and oils  Soft margarine without trans fats. Vegetable oil. Low-fat, reduced-fat, or light mayonnaise and salad dressings (reduced-sodium). Canola, safflower, olive, soybean, and sunflower oils. Avocado.  Seasoning and other foods  Herbs. Spices. Seasoning mixes without salt. Unsalted popcorn and pretzels. Fat-free sweets.  What foods are not recommended?  The items listed may not be a complete list. Talk with your dietitian about what dietary choices are best for you.  Grains  Baked goods made with fat, such as croissants, muffins, or some breads. Dry pasta or rice meal packs.  Vegetables  Creamed or fried vegetables. Vegetables in a cheese sauce. Regular canned vegetables (not low-sodium or reduced-sodium). Regular canned tomato sauce and paste (not low-sodium or reduced-sodium). Regular tomato and vegetable juice (not low-sodium or reduced-sodium). Pickles. Olives.  Fruits  Canned fruit in a light or heavy syrup. Fried fruit. Fruit in cream or butter sauce.  Meat and other protein foods  Fatty cuts of meat. Ribs. Fried meat. Bacon. Sausage. Bologna and other processed lunch meats. Salami. Fatback. Hotdogs. Bratwurst. Salted nuts and seeds. Canned beans with added salt. Canned or smoked fish. Whole eggs or egg yolks. Chicken or turkey with skin.  Dairy  Whole or 2% milk, cream, and half-and-half. Whole or full-fat cream cheese. Whole-fat or sweetened yogurt. Full-fat cheese. Nondairy creamers. Whipped toppings.  Processed cheese and cheese spreads.  Fats and oils  Butter. Stick margarine. Lard. Shortening. Ghee. Bacon fat. Tropical oils, such as coconut, palm kernel, or palm oil.  Seasoning and other foods  Salted popcorn and pretzels. Onion salt, garlic salt, seasoned salt, table salt, and sea salt. Worcestershire sauce. Tartar sauce. Barbecue sauce. Teriyaki sauce. Soy sauce, including reduced-sodium. Steak sauce. Canned and packaged gravies. Fish sauce. Oyster sauce. Cocktail sauce. Horseradish that you find on the shelf. Ketchup. Mustard. Meat flavorings and tenderizers. Bouillon cubes. Hot sauce and Tabasco sauce. Premade or packaged marinades. Premade or packaged taco seasonings. Relishes. Regular salad dressings.  Where to find more information:   National Heart, Lung, and Blood Institute: www.nhlbi.nih.gov   American Heart Association: www.heart.org  Summary   The DASH eating plan is a healthy eating plan that has been shown to reduce high blood pressure (hypertension). It may also reduce your risk for type 2 diabetes, heart disease, and stroke.   With the   DASH eating plan, you should limit salt (sodium) intake to 2,300 mg a day. If you have hypertension, you may need to reduce your sodium intake to 1,500 mg a day.   When on the DASH eating plan, aim to eat more fresh fruits and vegetables, whole grains, lean proteins, low-fat dairy, and heart-healthy fats.   Work with your health care provider or diet and nutrition specialist (dietitian) to adjust your eating plan to your individual calorie needs.  This information is not intended to replace advice given to you by your health care provider. Make sure you discuss any questions you have with your health care provider.  Document Released: 08/31/2011 Document Revised: 09/04/2016 Document Reviewed: 09/04/2016  Elsevier Interactive Patient Education  2019 [employer]

## 2018-11-14 NOTE — Assessment & Plan Note (Signed)
No longer checking lipid panel per patient request.  She refuses statin.

## 2018-11-14 NOTE — Assessment & Plan Note (Signed)
Chronic, ongoing.  BP at goal in office today, but home BP elevated and reports occasional BLE.  Script for HCTZ 12.5MG  daily sent.  Will continued current Losartan dose.  Return in 4 weeks for follow-up.

## 2018-11-14 NOTE — Progress Notes (Addendum)
BP 131/83   Pulse 75   Temp 97.7 F (36.5 C)   Ht 5\' 6"  (1.676 m)   Wt 237 lb 8 oz (107.7 kg)   LMP 12/15/1991 (Approximate)   SpO2 100%   BMI 38.33 kg/m    Subjective:    Patient ID: Tiffany Delacruz, female    DOB: [birth year], 68 y.o.   MRN: [id number]  HPI: Tiffany Delacruz is a 68 y.o. female present for follow-up visit  Chief Complaint  Patient presents with  . Hyperlipidemia  . Hypertension   HYPERTENSION / HYPERLIPIDEMIA Continues on Losartan daily.  No current cholesterol medications.  She checks her BP at home three times a week with an electronic arm cuff, will bring this into office next visit.  At length discussion with her about her current ASCVD risk score and educated on what score means.  She continues to report wishes not to take medication for cholesterol and would not like lipid panel checked anymore.  States "I understand the risks".  She inquired into whether it is okay to take two Losartan pills or 1 and 1/2.  Recommended she not do this as she is at max dose with 100MG  daily. Satisfied with current treatment? yes Duration of hypertension: chronic BP monitoring frequency: a few times a week BP range: 150-160/90 BP medication side effects: no Duration of hyperlipidemia: chronic Aspirin: no Recent stressors: no Recurrent headaches: no Visual changes: no Palpitations: no Dyspnea: no Chest pain: no Lower extremity edema: no Dizzy/lightheaded: no   The 10-year ASCVD risk score Mikey Bussing DC Jr., et al., 2013) is: 11.8%   Values used to calculate the score:     Age: 57 years     Sex: Female     Is Non-Hispanic African American: No     Diabetic: No     Tobacco smoker: No     Systolic Blood Pressure: 032 mmHg     Is BP treated: Yes     HDL Cholesterol: 55 mg/dL     Total Cholesterol: 198 mg/dL  Relevant past medical, surgical, family and social history reviewed and updated as indicated. Interim medical history since our last visit reviewed. Allergies and  medications reviewed and updated.  Review of Systems  Constitutional: Negative for activity change, appetite change, diaphoresis, fatigue and fever.  Respiratory: Negative for cough, chest tightness and shortness of breath.   Cardiovascular: Negative for chest pain, palpitations and leg swelling.  Gastrointestinal: Negative for abdominal distention, abdominal pain, constipation, diarrhea, nausea and vomiting.  Endocrine: Negative for cold intolerance, heat intolerance, polydipsia, polyphagia and polyuria.  Neurological: Negative for dizziness, syncope, weakness, light-headedness, numbness and headaches.  Psychiatric/Behavioral: Negative.     Per HPI unless specifically indicated above     Objective:    BP 131/83   Pulse 75   Temp 97.7 F (36.5 C)   Ht 5\' 6"  (1.676 m)   Wt 237 lb 8 oz (107.7 kg)   LMP 12/15/1991 (Approximate)   SpO2 100%   BMI 38.33 kg/m   Wt Readings from Last 3 Encounters:  11/14/18 237 lb 8 oz (107.7 kg)  11/14/18 237 lb 12.8 oz (107.9 kg)  08/13/18 233 lb 6 oz (105.9 kg)    Physical Exam Vitals signs and nursing note reviewed.  Constitutional:      Appearance: She is well-developed.  HENT:     Head: Normocephalic.  Eyes:     General:        Right eye: No discharge.  Left eye: No discharge.     Conjunctiva/sclera: Conjunctivae normal.     Pupils: Pupils are equal, round, and reactive to light.  Neck:     Musculoskeletal: Normal range of motion and neck supple.     Thyroid: No thyromegaly.     Vascular: No carotid bruit or JVD.  Cardiovascular:     Rate and Rhythm: Normal rate and regular rhythm.     Heart sounds: Normal heart sounds. No murmur. No gallop.   Pulmonary:     Effort: Pulmonary effort is normal.     Breath sounds: Normal breath sounds.  Abdominal:     General: Bowel sounds are normal.     Palpations: Abdomen is soft.  Musculoskeletal:     Right lower leg: Edema (trace) present.     Left lower leg: Edema (trace) present.    Lymphadenopathy:     Cervical: No cervical adenopathy.  Skin:    General: Skin is warm and dry.  Neurological:     Mental Status: She is alert and oriented to person, place, and time.  Psychiatric:        Mood and Affect: Mood normal.        Behavior: Behavior normal.        Thought Content: Thought content normal.        Judgment: Judgment normal.     Results for orders placed or performed in visit on 08/13/18  TSH  Result Value Ref Range   TSH 1.780 0.450 - 4.500 uIU/mL      Assessment & Plan:   Problem List Items Addressed This Visit      Cardiovascular and Mediastinum   Hypertension    Chronic, ongoing.  BP at goal in office today, but home BP elevated and reports occasional BLE.  Script for HCTZ 12.5MG  daily sent.  Will continued current Losartan dose.  Return in 4 weeks for follow-up.      Relevant Medications   hydrochlorothiazide (HYDRODIURIL) 12.5 MG tablet     Other   Hyperlipidemia    No longer checking lipid panel per patient request.  She refuses statin.      Relevant Medications   hydrochlorothiazide (HYDRODIURIL) 12.5 MG tablet       Follow up plan: Return in about 4 weeks (around 12/12/2018).

## 2018-12-12 ENCOUNTER — Other Ambulatory Visit: Payer: Self-pay

## 2018-12-12 ENCOUNTER — Encounter: Payer: Self-pay | Admitting: Nurse Practitioner

## 2018-12-12 ENCOUNTER — Ambulatory Visit (INDEPENDENT_AMBULATORY_CARE_PROVIDER_SITE_OTHER): Payer: Medicare Other | Admitting: Nurse Practitioner

## 2018-12-12 VITALS — BP 133/85 | HR 67 | Temp 98.0°F

## 2018-12-12 DIAGNOSIS — I10 Essential (primary) hypertension: Secondary | ICD-10-CM

## 2018-12-12 NOTE — Assessment & Plan Note (Signed)
Chronic, ongoing.  Continue current medication regimen.  BP at goal today and improved.  Will obtain BMP with medication change.

## 2018-12-12 NOTE — Progress Notes (Signed)
BP 133/85   Pulse 67   Temp 98 F (36.7 C) (Oral)   LMP 12/15/1991 (Approximate)   SpO2 97%    Subjective:    Patient ID: Tiffany Delacruz, female    DOB: 12-Jan-1951, 68 y.o.   MRN: [id number]  HPI: Tiffany Delacruz is a 68 y.o. female  Chief Complaint  Patient presents with  . Hypertension    4 week f/up   HYPERTENSION Started HCTZ 12.5 MG at last visit.  Continues to take this with Losartan.   Hypertension status: stable  Satisfied with current treatment? no Duration of hypertension: chronic BP monitoring frequency:  daily BP range: 130/80 range at home BP medication side effects:  no Medication compliance: good compliance Aspirin: no Recurrent headaches: no Visual changes: no Palpitations: no Dyspnea: no Chest pain: no Lower extremity edema: no Dizzy/lightheaded: no  Relevant past medical, surgical, family and social history reviewed and updated as indicated. Interim medical history since our last visit reviewed. Allergies and medications reviewed and updated.  Review of Systems  Constitutional: Negative for activity change, appetite change, diaphoresis, fatigue and fever.  Respiratory: Negative for cough, chest tightness and shortness of breath.   Cardiovascular: Negative for chest pain, palpitations and leg swelling.  Gastrointestinal: Negative for abdominal distention, abdominal pain, constipation, diarrhea, nausea and vomiting.  Endocrine: Negative for cold intolerance, heat intolerance, polydipsia, polyphagia and polyuria.  Neurological: Negative for dizziness, syncope, weakness, light-headedness, numbness and headaches.  Psychiatric/Behavioral: Negative.     Per HPI unless specifically indicated above     Objective:    BP 133/85   Pulse 67   Temp 98 F (36.7 C) (Oral)   LMP 12/15/1991 (Approximate)   SpO2 97%   Wt Readings from Last 3 Encounters:  11/14/18 237 lb 8 oz (107.7 kg)  11/14/18 237 lb 12.8 oz (107.9 kg)  08/13/18 233 lb 6 oz (105.9 kg)     Physical Exam Vitals signs and nursing note reviewed.  Constitutional:      Appearance: She is well-developed.  HENT:     Head: Normocephalic.  Eyes:     General:        Right eye: No discharge.        Left eye: No discharge.     Conjunctiva/sclera: Conjunctivae normal.     Pupils: Pupils are equal, round, and reactive to light.  Neck:     Musculoskeletal: Normal range of motion and neck supple.     Thyroid: No thyromegaly.     Vascular: No carotid bruit or JVD.  Cardiovascular:     Rate and Rhythm: Normal rate and regular rhythm.     Heart sounds: Normal heart sounds. No murmur. No gallop.   Pulmonary:     Effort: Pulmonary effort is normal.     Breath sounds: Normal breath sounds.  Abdominal:     General: Bowel sounds are normal.     Palpations: Abdomen is soft.  Musculoskeletal:     Right lower leg: No edema.     Left lower leg: No edema.  Lymphadenopathy:     Cervical: No cervical adenopathy.  Skin:    General: Skin is warm and dry.  Neurological:     Mental Status: She is alert and oriented to person, place, and time.  Psychiatric:        Mood and Affect: Mood normal.        Behavior: Behavior normal.        Thought Content: Thought content normal.  Judgment: Judgment normal.     Results for orders placed or performed in visit on 08/13/18  TSH  Result Value Ref Range   TSH 1.780 0.450 - 4.500 uIU/mL      Assessment & Plan:   Problem List Items Addressed This Visit      Cardiovascular and Mediastinum   Hypertension - Primary    Chronic, ongoing.  Continue current medication regimen.  BP at goal today and improved.  Will obtain BMP with medication change.      Relevant Orders   Basic Metabolic Panel (BMET)       Follow up plan: Return in about 6 months (around 06/14/2019) for Thyroid and HTN/HLD.

## 2018-12-12 NOTE — Patient Instructions (Signed)
DASH Eating Plan  DASH stands for "Dietary Approaches to Stop Hypertension." The DASH eating plan is a healthy eating plan that has been shown to reduce high blood pressure (hypertension). It may also reduce your risk for type 2 diabetes, heart disease, and stroke. The DASH eating plan may also help with weight loss.  What are tips for following this plan?    General guidelines   Avoid eating more than 2,300 mg (milligrams) of salt (sodium) a day. If you have hypertension, you may need to reduce your sodium intake to 1,500 mg a day.   Limit alcohol intake to no more than 1 drink a day for nonpregnant women and 2 drinks a day for men. One drink equals 12 oz of beer, 5 oz of wine, or 1 oz of hard liquor.   Work with your health care provider to maintain a healthy body weight or to lose weight. Ask what an ideal weight is for you.   Get at least 30 minutes of exercise that causes your heart to beat faster (aerobic exercise) most days of the week. Activities may include walking, swimming, or biking.   Work with your health care provider or diet and nutrition specialist (dietitian) to adjust your eating plan to your individual calorie needs.  Reading food labels     Check food labels for the amount of sodium per serving. Choose foods with less than 5 percent of the Daily Value of sodium. Generally, foods with less than 300 mg of sodium per serving fit into this eating plan.   To find whole grains, look for the word "whole" as the first word in the ingredient list.  Shopping   Buy products labeled as "low-sodium" or "no salt added."   Buy fresh foods. Avoid canned foods and premade or frozen meals.  Cooking   Avoid adding salt when cooking. Use salt-free seasonings or herbs instead of table salt or sea salt. Check with your health care provider or pharmacist before using salt substitutes.   Do not fry foods. Cook foods using healthy methods such as baking, boiling, grilling, and broiling instead.   Cook with  heart-healthy oils, such as olive, canola, soybean, or sunflower oil.  Meal planning   Eat a balanced diet that includes:  ? 5 or more servings of fruits and vegetables each day. At each meal, try to fill half of your plate with fruits and vegetables.  ? Up to 6-8 servings of whole grains each day.  ? Less than 6 oz of lean meat, poultry, or fish each day. A 3-oz serving of meat is about the same size as a deck of cards. One egg equals 1 oz.  ? 2 servings of low-fat dairy each day.  ? A serving of nuts, seeds, or beans 5 times each week.  ? Heart-healthy fats. Healthy fats called Omega-3 fatty acids are found in foods such as flaxseeds and coldwater fish, like sardines, salmon, and mackerel.   Limit how much you eat of the following:  ? Canned or prepackaged foods.  ? Food that is high in trans fat, such as fried foods.  ? Food that is high in saturated fat, such as fatty meat.  ? Sweets, desserts, sugary drinks, and other foods with added sugar.  ? Full-fat dairy products.   Do not salt foods before eating.   Try to eat at least 2 vegetarian meals each week.   Eat more home-cooked food and less restaurant, buffet, and fast food.     When eating at a restaurant, ask that your food be prepared with less salt or no salt, if possible.  What foods are recommended?  The items listed may not be a complete list. Talk with your dietitian about what dietary choices are best for you.  Grains  Whole-grain or whole-wheat bread. Whole-grain or whole-wheat pasta. Brown rice. Oatmeal. Quinoa. Bulgur. Whole-grain and low-sodium cereals. Pita bread. Low-fat, low-sodium crackers. Whole-wheat flour tortillas.  Vegetables  Fresh or frozen vegetables (raw, steamed, roasted, or grilled). Low-sodium or reduced-sodium tomato and vegetable juice. Low-sodium or reduced-sodium tomato sauce and tomato paste. Low-sodium or reduced-sodium canned vegetables.  Fruits  All fresh, dried, or frozen fruit. Canned fruit in natural juice (without  added sugar).  Meat and other protein foods  Skinless chicken or turkey. Ground chicken or turkey. Pork with fat trimmed off. Fish and seafood. Egg whites. Dried beans, peas, or lentils. Unsalted nuts, nut butters, and seeds. Unsalted canned beans. Lean cuts of beef with fat trimmed off. Low-sodium, lean deli meat.  Dairy  Low-fat (1%) or fat-free (skim) milk. Fat-free, low-fat, or reduced-fat cheeses. Nonfat, low-sodium ricotta or cottage cheese. Low-fat or nonfat yogurt. Low-fat, low-sodium cheese.  Fats and oils  Soft margarine without trans fats. Vegetable oil. Low-fat, reduced-fat, or light mayonnaise and salad dressings (reduced-sodium). Canola, safflower, olive, soybean, and sunflower oils. Avocado.  Seasoning and other foods  Herbs. Spices. Seasoning mixes without salt. Unsalted popcorn and pretzels. Fat-free sweets.  What foods are not recommended?  The items listed may not be a complete list. Talk with your dietitian about what dietary choices are best for you.  Grains  Baked goods made with fat, such as croissants, muffins, or some breads. Dry pasta or rice meal packs.  Vegetables  Creamed or fried vegetables. Vegetables in a cheese sauce. Regular canned vegetables (not low-sodium or reduced-sodium). Regular canned tomato sauce and paste (not low-sodium or reduced-sodium). Regular tomato and vegetable juice (not low-sodium or reduced-sodium). Pickles. Olives.  Fruits  Canned fruit in a light or heavy syrup. Fried fruit. Fruit in cream or butter sauce.  Meat and other protein foods  Fatty cuts of meat. Ribs. Fried meat. Bacon. Sausage. Bologna and other processed lunch meats. Salami. Fatback. Hotdogs. Bratwurst. Salted nuts and seeds. Canned beans with added salt. Canned or smoked fish. Whole eggs or egg yolks. Chicken or turkey with skin.  Dairy  Whole or 2% milk, cream, and half-and-half. Whole or full-fat cream cheese. Whole-fat or sweetened yogurt. Full-fat cheese. Nondairy creamers. Whipped toppings.  Processed cheese and cheese spreads.  Fats and oils  Butter. Stick margarine. Lard. Shortening. Ghee. Bacon fat. Tropical oils, such as coconut, palm kernel, or palm oil.  Seasoning and other foods  Salted popcorn and pretzels. Onion salt, garlic salt, seasoned salt, table salt, and sea salt. Worcestershire sauce. Tartar sauce. Barbecue sauce. Teriyaki sauce. Soy sauce, including reduced-sodium. Steak sauce. Canned and packaged gravies. Fish sauce. Oyster sauce. Cocktail sauce. Horseradish that you find on the shelf. Ketchup. Mustard. Meat flavorings and tenderizers. Bouillon cubes. Hot sauce and Tabasco sauce. Premade or packaged marinades. Premade or packaged taco seasonings. Relishes. Regular salad dressings.  Where to find more information:   National Heart, Lung, and Blood Institute: www.nhlbi.nih.gov   American Heart Association: www.heart.org  Summary   The DASH eating plan is a healthy eating plan that has been shown to reduce high blood pressure (hypertension). It may also reduce your risk for type 2 diabetes, heart disease, and stroke.   With the   DASH eating plan, you should limit salt (sodium) intake to 2,300 mg a day. If you have hypertension, you may need to reduce your sodium intake to 1,500 mg a day.   When on the DASH eating plan, aim to eat more fresh fruits and vegetables, whole grains, lean proteins, low-fat dairy, and heart-healthy fats.   Work with your health care provider or diet and nutrition specialist (dietitian) to adjust your eating plan to your individual calorie needs.  This information is not intended to replace advice given to you by your health care provider. Make sure you discuss any questions you have with your health care provider.  Document Released: 08/31/2011 Document Revised: 09/04/2016 Document Reviewed: 09/04/2016  Elsevier Interactive Patient Education  2019 [employer]

## 2018-12-13 LAB — BASIC METABOLIC PANEL
BUN / CREAT RATIO: 21 (ref 12–28)
BUN: 19 mg/dL (ref 8–27)
CHLORIDE: 95 mmol/L — AB (ref 96–106)
CO2: 25 mmol/L (ref 20–29)
Calcium: 9.5 mg/dL (ref 8.7–10.3)
Creatinine, Ser: 0.89 mg/dL (ref 0.57–1.00)
GFR calc non Af Amer: 66 mL/min/{1.73_m2} (ref >59)
GFR, EST AFRICAN AMERICAN: 76 mL/min/{1.73_m2} (ref >59)
Glucose: 144 mg/dL — ABNORMAL HIGH (ref 65–99)
Potassium: 4.3 mmol/L (ref 3.5–5.2)
SODIUM: 137 mmol/L (ref 134–144)

## 2019-05-16 ENCOUNTER — Other Ambulatory Visit: Payer: Self-pay | Admitting: Physician Assistant

## 2019-05-16 DIAGNOSIS — J3089 Other allergic rhinitis: Secondary | ICD-10-CM

## 2019-06-18 ENCOUNTER — Ambulatory Visit: Payer: Medicare Other | Admitting: Nurse Practitioner

## 2019-06-19 ENCOUNTER — Ambulatory Visit: Payer: Medicare Other | Admitting: Nurse Practitioner

## 2019-06-27 ENCOUNTER — Other Ambulatory Visit: Payer: Self-pay

## 2019-06-27 ENCOUNTER — Encounter: Payer: Self-pay | Admitting: Nurse Practitioner

## 2019-06-27 ENCOUNTER — Ambulatory Visit: Payer: Medicare Other | Admitting: Nurse Practitioner

## 2019-06-27 VITALS — BP 91/62 | HR 84 | Temp 99.0°F | Ht 66.0 in | Wt 238.0 lb

## 2019-06-27 DIAGNOSIS — R5383 Other fatigue: Secondary | ICD-10-CM

## 2019-06-27 DIAGNOSIS — E785 Hyperlipidemia, unspecified: Secondary | ICD-10-CM

## 2019-06-27 DIAGNOSIS — Z6838 Body mass index (BMI) 38.0-38.9, adult: Secondary | ICD-10-CM

## 2019-06-27 DIAGNOSIS — E039 Hypothyroidism, unspecified: Secondary | ICD-10-CM

## 2019-06-27 DIAGNOSIS — Z23 Encounter for immunization: Secondary | ICD-10-CM | POA: Diagnosis not present

## 2019-06-27 DIAGNOSIS — K219 Gastro-esophageal reflux disease without esophagitis: Secondary | ICD-10-CM

## 2019-06-27 DIAGNOSIS — I10 Essential (primary) hypertension: Secondary | ICD-10-CM

## 2019-06-27 DIAGNOSIS — G4733 Obstructive sleep apnea (adult) (pediatric): Secondary | ICD-10-CM | POA: Insufficient documentation

## 2019-06-27 DIAGNOSIS — R7301 Impaired fasting glucose: Secondary | ICD-10-CM | POA: Insufficient documentation

## 2019-06-27 DIAGNOSIS — Z1239 Encounter for other screening for malignant neoplasm of breast: Secondary | ICD-10-CM

## 2019-06-27 NOTE — Assessment & Plan Note (Signed)
Chronic, ongoing.  No current statin, refuses.  Obtain CMP and lipid panel today.

## 2019-06-27 NOTE — Assessment & Plan Note (Signed)
Recommend continued focus on health diet choices and regular physical activity (30 minutes 5 days a week). 

## 2019-06-27 NOTE — Patient Instructions (Addendum)
Hypothyroidism  Hypothyroidism is when the thyroid gland does not make enough of certain hormones (it is underactive). The thyroid gland is a small gland located in the lower front part of the neck, just in front of the windpipe (trachea). This gland makes hormones that help control how the body uses food for energy (metabolism) as well as how the heart and brain function. These hormones also play a role in keeping your bones strong. When the thyroid is underactive, it produces too little of the hormones thyroxine (T4) and triiodothyronine (T3). What are the causes? This condition may be caused by:  Hashimoto's disease. This is a disease in which the body's disease-fighting system (immune system) attacks the thyroid gland. This is the most common cause.  Viral infections.  Pregnancy.  Certain medicines.  Birth defects.  Past radiation treatments to the head or neck for cancer.  Past treatment with radioactive iodine.  Past exposure to radiation in the environment.  Past surgical removal of part or all of the thyroid.  Problems with a gland in the center of the brain (pituitary gland).  Lack of enough iodine in the diet. What increases the risk? You are more likely to develop this condition if:  You are female.  You have a family history of thyroid conditions.  You use a medicine called lithium.  You take medicines that affect the immune system (immunosuppressants). What are the signs or symptoms? Symptoms of this condition include:  Feeling as though you have no energy (lethargy).  Not being able to tolerate cold.  Weight gain that is not explained by a change in diet or exercise habits.  Lack of appetite.  Dry skin.  Coarse hair.  Menstrual irregularity.  Slowing of thought processes.  Constipation.  Sadness or depression. How is this diagnosed? This condition may be diagnosed based on:  Your symptoms, your medical history, and a physical exam.  Blood  tests. You may also have imaging tests, such as an ultrasound or MRI. How is this treated? This condition is treated with medicine that replaces the thyroid hormones that your body does not make. After you begin treatment, it may take several weeks for symptoms to go away. Follow these instructions at home:  Take over-the-counter and prescription medicines only as told by your health care provider.  If you start taking any new medicines, tell your health care provider.  Keep all follow-up visits as told by your health care provider. This is important. ? As your condition improves, your dosage of thyroid hormone medicine may change. ? You will need to have blood tests regularly so that your health care provider can monitor your condition. Contact a health care provider if:  Your symptoms do not get better with treatment.  You are taking thyroid replacement medicine and you: ? Sweat a lot. ? Have tremors. ? Feel anxious. ? Lose weight rapidly. ? Cannot tolerate heat. ? Have emotional swings. ? Have diarrhea. ? Feel weak. Get help right away if you have:  Chest pain.  An irregular heartbeat.  A rapid heartbeat.  Difficulty breathing. Summary  Hypothyroidism is when the thyroid gland does not make enough of certain hormones (it is underactive).  When the thyroid is underactive, it produces too little of the hormones thyroxine (T4) and triiodothyronine (T3).  The most common cause is Hashimoto's disease, a disease in which the body's disease-fighting system (immune system) attacks the thyroid gland. The condition can also be caused by viral infections, medicine, pregnancy, or past   radiation treatment to the head or neck.  Symptoms may include weight gain, dry skin, constipation, feeling as though you do not have energy, and not being able to tolerate cold.  This condition is treated with medicine to replace the thyroid hormones that your body does not make. This information  is not intended to replace advice given to you by your health care provider. Make sure you discuss any questions you have with your health care provider. Document Released: 09/11/2005 Document Revised: 08/24/2017 Document Reviewed: 08/22/2017 Elsevier Patient Education  2020 [employer].  

## 2019-06-27 NOTE — Assessment & Plan Note (Signed)
Obtain A1C today and recommend diet changes at home + modest weight loss goal.

## 2019-06-27 NOTE — Assessment & Plan Note (Signed)
Chronic, ongoing.  Continue current Levothyroxine dose and adjust as needed.  Obtain thyroid panel today.

## 2019-06-27 NOTE — Assessment & Plan Note (Signed)
Nonadherent to CPAP for 2 years.  Refuses repeat sleep study to determine if different mask can be provided for comfort and adherence.  Discussed at length risks of poor CPAP compliance, including cardiac risk over time.

## 2019-06-27 NOTE — Progress Notes (Signed)
BP 91/62   Pulse 84   Temp 99 F (37.2 C) (Oral)   Ht 5\' 6"  (1.676 m)   Wt 238 lb (108 kg)   LMP 12/15/1991 (Approximate)   SpO2 93%   BMI 38.41 kg/m    Subjective:    Patient ID: Tiffany Delacruz, female    DOB: February 28, 1951, 68 y.o.   MRN: [id number]  HPI: Tiffany Delacruz is a 68 y.o. female  Chief Complaint  Patient presents with  . Hypothyroidism    30m f/u  . Hyperlipidemia  . Hypertension  . Fatigue   HYPOTHYROIDISM Continues on Levothyroxine 175 MG.   Thyroid control status:stable Satisfied with current treatment? yes Medication side effects: no Medication compliance: good compliance Etiology of hypothyroidism:  Recent dose adjustment:no Fatigue: yes Cold intolerance: no Heat intolerance: no Weight gain: yes Weight loss: no Constipation: no Diarrhea/loose stools: no Palpitations: no Lower extremity edema: at times a little Anxiety/depressed mood: no   HYPERTENSION / HYPERLIPIDEMIA Currently taking Losartan 100 MG and is taking HCTZ 25 MG (two 12.5 MG tablets).  Satisfied with current treatment? yes Duration of hypertension: chronic BP monitoring frequency: rarely BP range: 130/90's at time at home BP medication side effects: no Duration of hyperlipidemia: chronic Cholesterol medication side effects: no Aspirin: no Recent stressors: no Recurrent headaches: no Visual changes: no Palpitations: no Dyspnea: no Chest pain: no Lower extremity edema: sometimes a little Dizzy/lightheaded: no   GERD Continues on Famotidine.  GERD control status: stable  Satisfied with current treatment? yes Heartburn frequency: none Medication side effects: no  Medication compliance: stable Previous GERD medications: Dysphagia: no Odynophagia:  no Hematemesis: no Blood in stool: no EGD: no  FATIGUE Present for 7 months. Can not perform any strenuous activity.  If goes long distances has to rest or stand still.  Feels exhausted.  Walking 50 feet makes her tired  and has some SOB.  Denies CP, palpitations, diaphoresis, or N&V.  Has been diagnosed with OSA, but does not use machine as reports she does not like mask.  Had sleep study in [place] years ago.  Has not used CPAP in 2 years.   On review labs had some elevation in glucose last two labs: 135-144.  Denies recent URI or acute illness. Duration:  months Severity: moderate  Onset: gradual Context when symptoms started:  unknown Symptoms improve with rest: yes  Depressive symptoms: no Stress/anxiety: no Insomnia: none, sleeps about 6-8 hours Snoring: yes Observed apnea by bed partner: no Daytime hypersomnolence:no Wakes feeling refreshed: no History of sleep study: yes Dysnea on exertion:  at times Orthopnea/PND: no Chest pain: no Chronic cough: no Lower extremity edema: a little bit on and off Arthralgias:no Myalgias: no Weakness: no Rash: no  Depression screen [facility] 2/9 06/27/2019 11/14/2018 08/13/2018 05/13/2018 11/09/2017  Decreased Interest 0 0 0 0 0  Down, Depressed, Hopeless 0 0 0 0 0  PHQ - 2 Score 0 0 0 0 0  Altered sleeping 0 - 0 - 1  Tired, decreased energy 3 - 0 - 0  Change in appetite 0 - 0 - 0  Feeling bad or failure about yourself  0 - 0 - 0  Trouble concentrating 0 - 0 - 0  Moving slowly or fidgety/restless 0 - 0 - 0  Suicidal thoughts 0 - 0 - 0  PHQ-9 Score 3 - 0 - 1  Difficult doing work/chores - - Not difficult at all - Not difficult at all   GAD 7 : Generalized  Anxiety Score 06/27/2019  Nervous, Anxious, on Edge 0  Control/stop worrying 0  Worry too much - different things 0  Trouble relaxing 3  Restless 0  Easily annoyed or irritable 0  Afraid - awful might happen 0  Total GAD 7 Score 3  Anxiety Difficulty Not difficult at all    Relevant past medical, surgical, family and social history reviewed and updated as indicated. Interim medical history since our last visit reviewed. Allergies and medications reviewed and updated.  Review of Systems   Constitutional: Positive for fatigue. Negative for activity change, appetite change, diaphoresis and fever.  Respiratory: Positive for shortness of breath. Negative for cough, chest tightness and wheezing.   Cardiovascular: Negative for chest pain, palpitations and leg swelling.  Gastrointestinal: Negative for abdominal distention, abdominal pain, constipation, diarrhea, nausea and vomiting.  Endocrine: Negative for cold intolerance, heat intolerance, polydipsia, polyphagia and polyuria.  Neurological: Negative for dizziness, syncope, weakness, light-headedness, numbness and headaches.  Psychiatric/Behavioral: Negative.     Per HPI unless specifically indicated above     Objective:    BP 91/62   Pulse 84   Temp 99 F (37.2 C) (Oral)   Ht 5\' 6"  (1.676 m)   Wt 238 lb (108 kg)   LMP 12/15/1991 (Approximate)   SpO2 93%   BMI 38.41 kg/m   Wt Readings from Last 3 Encounters:  06/27/19 238 lb (108 kg)  11/14/18 237 lb 8 oz (107.7 kg)  11/14/18 237 lb 12.8 oz (107.9 kg)    Physical Exam Vitals signs and nursing note reviewed.  Constitutional:      General: She is awake. She is not in acute distress.    Appearance: She is well-developed. She is obese. She is not ill-appearing.  HENT:     Head: Normocephalic.     Right Ear: Hearing normal.     Left Ear: Hearing normal.  Eyes:     General: Lids are normal.        Right eye: No discharge.        Left eye: No discharge.     Conjunctiva/sclera: Conjunctivae normal.     Pupils: Pupils are equal, round, and reactive to light.  Neck:     Musculoskeletal: Normal range of motion and neck supple.     Thyroid: No thyromegaly.     Vascular: No carotid bruit.  Cardiovascular:     Rate and Rhythm: Normal rate and regular rhythm.     Heart sounds: Normal heart sounds. No murmur. No gallop.   Pulmonary:     Effort: Pulmonary effort is normal. No accessory muscle usage or respiratory distress.     Breath sounds: Normal breath sounds.   Abdominal:     General: Bowel sounds are normal.     Palpations: Abdomen is soft. There is no hepatomegaly or splenomegaly.     Tenderness: There is no abdominal tenderness.  Musculoskeletal:     Right lower leg: No edema.     Left lower leg: No edema.  Lymphadenopathy:     Head:     Right side of head: No submental, submandibular, tonsillar, preauricular or posterior auricular adenopathy.     Left side of head: No submental, submandibular, tonsillar, preauricular or posterior auricular adenopathy.  Skin:    General: Skin is warm and dry.  Neurological:     Mental Status: She is alert and oriented to person, place, and time.     Deep Tendon Reflexes: Reflexes are normal and symmetric.  Reflex Scores:      Brachioradialis reflexes are 2+ on the right side and 2+ on the left side.      Patellar reflexes are 2+ on the right side and 2+ on the left side. Psychiatric:        Attention and Perception: Attention normal.        Mood and Affect: Mood normal.        Speech: Speech normal.        Behavior: Behavior normal. Behavior is cooperative.        Thought Content: Thought content normal.        Judgment: Judgment normal.     Results for orders placed or performed in visit on 123XX123  Basic Metabolic Panel (BMET)  Result Value Ref Range   Glucose 144 (H) 65 - 99 mg/dL   BUN 19 8 - 27 mg/dL   Creatinine, Ser 0.89 0.57 - 1.00 mg/dL   GFR calc non Af Amer 66 >59 mL/min/1.73   GFR calc Af Amer 76 >59 mL/min/1.73   BUN/Creatinine Ratio 21 12 - 28   Sodium 137 134 - 144 mmol/L   Potassium 4.3 3.5 - 5.2 mmol/L   Chloride 95 (L) 96 - 106 mmol/L   CO2 25 20 - 29 mmol/L   Calcium 9.5 8.7 - 10.3 mg/dL      Assessment & Plan:   Problem List Items Addressed This Visit      Cardiovascular and Mediastinum   Hypertension    Chronic, ongoing with BP on lower side today.  Recommend she return to 12.5 MG HCTZ and not take 25 MG.  Continue Losartan, may consider reduction if continues  to be on lower side.  Continue to monitor BP daily at home and document.  Obtain labs today.      Relevant Orders   Comprehensive metabolic panel     Respiratory   OSA (obstructive sleep apnea)    Nonadherent to CPAP for 2 years.  Refuses repeat sleep study to determine if different mask can be provided for comfort and adherence.  Discussed at length risks of poor CPAP compliance, including cardiac risk over time.          Digestive   GERD (gastroesophageal reflux disease)    Chronic, stable on Famotidine.  Continue current medication regimen and adjust as needed.  Obtain Mag level today.      Relevant Orders   Magnesium     Endocrine   Hypothyroidism    Chronic, ongoing.  Continue current Levothyroxine dose and adjust as needed.  Obtain thyroid panel today.        Relevant Orders   Thyroid Panel With TSH   IFG (impaired fasting glucose)    Obtain A1C today and recommend diet changes at home + modest weight loss goal.      Relevant Orders   HgB A1c     Other   Hyperlipidemia    Chronic, ongoing.  No current statin, refuses.  Obtain CMP and lipid panel today.        Relevant Orders   Comprehensive metabolic panel   Lipid Panel w/o Chol/HDL Ratio   Obesity    Recommend continued focus on health diet choices and regular physical activity (30 minutes 5 days a week).       Fatigue - Primary    Multiple risk factors: obesity, non adherence to CPAP, and multiple chronic diseases.  No significant weight gain or loss noted on review.  Will obtain CBC,  anemia panel, TSH, CMP, A1C, BNP today.  Have return in two weeks, if ongoing and labs return WNL will obtain spirometry, EKG, and imaging.        Relevant Orders   B Nat Peptide   CBC with Differential/Platelet   Anemia panel    Other Visit Diagnoses    Flu vaccine need       Relevant Orders   Flu Vaccine QUAD High Dose(Fluad) (Completed)   Encounter for screening for malignant neoplasm of breast, unspecified  screening modality       Relevant Orders   MM DIGITAL SCREENING BILATERAL      Time: 25 minutes, >50% spent counseling on CPAP and importance of adherence  Follow up plan: Return in about 2 weeks (around 07/11/2019) for Fatigue.

## 2019-06-27 NOTE — Assessment & Plan Note (Signed)
Chronic, ongoing with BP on lower side today.  Recommend she return to 12.5 MG HCTZ and not take 25 MG.  Continue Losartan, may consider reduction if continues to be on lower side.  Continue to monitor BP daily at home and document.  Obtain labs today.

## 2019-06-27 NOTE — Assessment & Plan Note (Signed)
Multiple risk factors: obesity, non adherence to CPAP, and multiple chronic diseases.  No significant weight gain or loss noted on review.  Will obtain CBC, anemia panel, TSH, CMP, A1C, BNP today.  Have return in two weeks, if ongoing and labs return WNL will obtain spirometry, EKG, and imaging.

## 2019-06-27 NOTE — Assessment & Plan Note (Signed)
Chronic, stable on Famotidine.  Continue current medication regimen and adjust as needed.  Obtain Mag level today.

## 2019-06-30 ENCOUNTER — Other Ambulatory Visit: Payer: Self-pay | Admitting: Nurse Practitioner

## 2019-06-30 DIAGNOSIS — E039 Hypothyroidism, unspecified: Secondary | ICD-10-CM

## 2019-06-30 MED ORDER — LEVOTHYROXINE SODIUM 150 MCG PO TABS: 150.0000 ug | ORAL_TABLET | Freq: Every day | ORAL | 3 refills | Status: DC

## 2019-06-30 NOTE — Progress Notes (Signed)
TSH level low, change in Levothyroxine to 150 MCG.

## 2019-07-01 LAB — CBC WITH DIFFERENTIAL/PLATELET
Basophils Absolute: 0 10*3/uL (ref 0.0–0.2)
Basos: 1 %
EOS (ABSOLUTE): 0.1 10*3/uL (ref 0.0–0.4)
Eos: 3 %
Hemoglobin: 13.5 g/dL (ref 11.1–15.9)
Immature Grans (Abs): 0 10*3/uL (ref 0.0–0.1)
Immature Granulocytes: 1 %
Lymphocytes Absolute: 1.5 10*3/uL (ref 0.7–3.1)
Lymphs: 28 %
MCH: 28.5 pg (ref 26.6–33.0)
MCHC: 34.2 g/dL (ref 31.5–35.7)
MCV: 84 fL (ref 79–97)
Monocytes Absolute: 0.4 10*3/uL (ref 0.1–0.9)
Monocytes: 8 %
Neutrophils Absolute: 3.4 10*3/uL (ref 1.4–7.0)
Neutrophils: 59 %
Platelets: 233 10*3/uL (ref 150–450)
RBC: 4.73 x10E6/uL (ref 3.77–5.28)
RDW: 14 % (ref 11.7–15.4)
WBC: 5.6 10*3/uL (ref 3.4–10.8)

## 2019-07-01 LAB — COMPREHENSIVE METABOLIC PANEL
ALT: 9 IU/L (ref 0–32)
AST: 9 IU/L (ref 0–40)
Albumin/Globulin Ratio: 1.8 (ref 1.2–2.2)
Albumin: 4.3 g/dL (ref 3.8–4.8)
Alkaline Phosphatase: 89 IU/L (ref 39–117)
BUN/Creatinine Ratio: 32 — ABNORMAL HIGH (ref 12–28)
BUN: 29 mg/dL — ABNORMAL HIGH (ref 8–27)
Bilirubin Total: 1 mg/dL (ref 0.0–1.2)
CO2: 25 mmol/L (ref 20–29)
Calcium: 9.6 mg/dL (ref 8.7–10.3)
Chloride: 100 mmol/L (ref 96–106)
Creatinine, Ser: 0.92 mg/dL (ref 0.57–1.00)
GFR calc Af Amer: 73 mL/min/{1.73_m2} (ref >59)
GFR calc non Af Amer: 63 mL/min/{1.73_m2} (ref >59)
Globulin, Total: 2.4 g/dL (ref 1.5–4.5)
Glucose: 112 mg/dL — ABNORMAL HIGH (ref 65–99)
Potassium: 4.1 mmol/L (ref 3.5–5.2)
Sodium: 140 mmol/L (ref 134–144)
Total Protein: 6.7 g/dL (ref 6.0–8.5)

## 2019-07-01 LAB — THYROID PANEL WITH TSH
Free Thyroxine Index: 3.9 (ref 1.2–4.9)
T3 Uptake Ratio: 32 % (ref 24–39)
T4, Total: 12.2 ug/dL — ABNORMAL HIGH (ref 4.5–12.0)
TSH: 0.261 u[IU]/mL — ABNORMAL LOW (ref 0.450–4.500)

## 2019-07-01 LAB — ANEMIA PANEL
Ferritin: 149 ng/mL (ref 15–150)
Folate, Hemolysate: 322 ng/mL
Folate, RBC: 815 ng/mL (ref >498)
Hematocrit: 39.5 % (ref 34.0–46.6)
Iron Saturation: 25 % (ref 15–55)
Iron: 78 ug/dL (ref 27–139)
Retic Ct Pct: 1.8 % (ref 0.6–2.6)
Total Iron Binding Capacity: 308 ug/dL (ref 250–450)
UIBC: 230 ug/dL (ref 118–369)
Vitamin B-12: 527 pg/mL (ref 232–1245)

## 2019-07-01 LAB — LIPID PANEL W/O CHOL/HDL RATIO
Cholesterol, Total: 202 mg/dL — ABNORMAL HIGH (ref 100–199)
HDL: 55 mg/dL (ref >39)
LDL Chol Calc (NIH): 130 mg/dL — ABNORMAL HIGH (ref 0–99)
Triglycerides: 95 mg/dL (ref 0–149)
VLDL Cholesterol Cal: 17 mg/dL (ref 5–40)

## 2019-07-01 LAB — BRAIN NATRIURETIC PEPTIDE: BNP: 29.4 pg/mL (ref 0.0–100.0)

## 2019-07-01 LAB — MAGNESIUM: Magnesium: 2.1 mg/dL (ref 1.6–2.3)

## 2019-07-01 LAB — HEMOGLOBIN A1C
Est. average glucose Bld gHb Est-mCnc: 103 mg/dL
Hgb A1c MFr Bld: 5.2 % (ref 4.8–5.6)

## 2019-07-11 ENCOUNTER — Ambulatory Visit: Payer: Medicare Other | Admitting: Nurse Practitioner

## 2019-08-13 ENCOUNTER — Other Ambulatory Visit: Payer: Self-pay

## 2019-08-14 ENCOUNTER — Other Ambulatory Visit: Payer: Self-pay

## 2019-08-14 ENCOUNTER — Other Ambulatory Visit: Payer: Medicare Other

## 2019-08-14 DIAGNOSIS — E039 Hypothyroidism, unspecified: Secondary | ICD-10-CM

## 2019-08-15 LAB — THYROID PANEL WITH TSH
Free Thyroxine Index: 2.4 (ref 1.2–4.9)
T3 Uptake Ratio: 29 % (ref 24–39)
T4, Total: 8.3 ug/dL (ref 4.5–12.0)
TSH: 2.26 u[IU]/mL (ref 0.450–4.500)

## 2019-08-25 ENCOUNTER — Other Ambulatory Visit: Payer: Self-pay

## 2019-08-25 ENCOUNTER — Encounter: Payer: Self-pay | Admitting: Nurse Practitioner

## 2019-08-25 ENCOUNTER — Ambulatory Visit: Payer: Medicare Other | Admitting: Nurse Practitioner

## 2019-08-25 VITALS — BP 109/74 | HR 75 | Temp 98.3°F

## 2019-08-25 DIAGNOSIS — R5383 Other fatigue: Secondary | ICD-10-CM

## 2019-08-25 NOTE — Assessment & Plan Note (Addendum)
Suspect multifactorial with obesity, HTN, HLD, hypothyroid, OSA (with no CPAP use).  Spirometry with FEV1 77% and FEV1/FVC 113%.  EKG borderline.  Recent labs WNL.  Will continue current medication regimen and place referral to cardiology for further work-up, may benefit from Holter and Echo.

## 2019-08-25 NOTE — Patient Instructions (Signed)

## 2019-08-25 NOTE — Progress Notes (Signed)
BP 109/74   Pulse 75   Temp 98.3 F (36.8 C) (Oral)   LMP 12/15/1991 (Approximate)   SpO2 98%    Subjective:    Patient ID: Tiffany Delacruz, female    DOB: 10-30-1950, 68 y.o.   MRN: [id number]  HPI: Tiffany Delacruz is a 68 y.o. female  Chief Complaint  Patient presents with  . Fatigue    pt states she has been having fatigue on exertion for about a year now     FATIGUE Present for about one year. Can not perform any strenuous activity.  If goes long distances has to rest or stand still.  Feels exhausted.  Walking 50 feet makes her tired and has some SOB, does endorse some wheezing with this. Denies CP, palpitations, diaphoresis, or N&V.  Has been diagnosed with OSA, but does not use machine as reports she does not like mask.  Had sleep study in [place] years ago.  Has not used CPAP in 2 years.  Recently had Levothyroxine dose reduction 150 MCG, which she reports some improvement with.  No history of smoking.  Denies alcohol use.  Never worked in [employer].  Did live with a smoker for several years.  Of note after procedures today and upon further discussion with education on heart provided patient did endorse she actually has had occasional CP over past year, about once a month, to left side.  Lasts < 1 minute and then goes away.  No N&V, diaphoresis, back pain, or shoulder pain with it. Denies recent URI or acute illness. Duration:  months Severity: moderate  Onset: gradual Context when symptoms started:  unknown Symptoms improve with rest: yes  Depressive symptoms: no Stress/anxiety: no Insomnia: none, sleeps about 6-8 hours Snoring: yes Observed apnea by bed partner: no Daytime hypersomnolence:no Wakes feeling refreshed: no History of sleep study: yes Dysnea on exertion:  at times Orthopnea/PND: no Chest pain: no Chronic cough: no Lower extremity edema: a little bit on and off Arthralgias:no Myalgias: no Weakness: no Rash: no  Relevant past  medical, surgical, family and social history reviewed and updated as indicated. Interim medical history since our last visit reviewed. Allergies and medications reviewed and updated.  Review of Systems  Constitutional: Positive for fatigue. Negative for activity change, appetite change, diaphoresis and fever.  Respiratory: Positive for shortness of breath and wheezing (occasional). Negative for cough and chest tightness.   Cardiovascular: Positive for chest pain (once a month for past year) and leg swelling (intermittent). Negative for palpitations.  Gastrointestinal: Negative for abdominal distention, abdominal pain, constipation, diarrhea, nausea and vomiting.  Endocrine: Negative for cold intolerance and heat intolerance.  Neurological: Negative for dizziness, syncope, weakness, light-headedness, numbness and headaches.  Psychiatric/Behavioral: Negative.     Per HPI unless specifically indicated above     Objective:    BP 109/74   Pulse 75   Temp 98.3 F (36.8 C) (Oral)   LMP 12/15/1991 (Approximate)   SpO2 98%   Wt Readings from Last 3 Encounters:  06/27/19 238 lb (108 kg)  11/14/18 237 lb 8 oz (107.7 kg)  11/14/18 237 lb 12.8 oz (107.9 kg)    Physical Exam Vitals signs and nursing note reviewed.  Constitutional:      General: She is awake. She is not in acute distress.    Appearance: She is well-developed. She is obese. She is not ill-appearing.  HENT:     Head: Normocephalic.     Right Ear: Hearing normal.  Left Ear: Hearing normal.  Eyes:     General: Lids are normal.        Right eye: No discharge.        Left eye: No discharge.     Conjunctiva/sclera: Conjunctivae normal.     Pupils: Pupils are equal, round, and reactive to light.  Neck:     Musculoskeletal: Normal range of motion and neck supple.     Thyroid: No thyromegaly.     Vascular: No carotid bruit or JVD.  Cardiovascular:     Rate and Rhythm: Normal rate and regular rhythm.     Heart sounds:  Normal heart sounds. No murmur. No gallop.   Pulmonary:     Effort: Pulmonary effort is normal. No accessory muscle usage or respiratory distress.     Breath sounds: Normal breath sounds.  Abdominal:     General: Bowel sounds are normal.     Palpations: Abdomen is soft. There is no hepatomegaly or splenomegaly.     Tenderness: There is no abdominal tenderness.  Musculoskeletal:     Right lower leg: No edema.     Left lower leg: No edema.  Lymphadenopathy:     Head:     Right side of head: No submental, submandibular, tonsillar, preauricular or posterior auricular adenopathy.     Left side of head: No submental, submandibular, tonsillar, preauricular or posterior auricular adenopathy.  Skin:    General: Skin is warm and dry.  Neurological:     Mental Status: She is alert and oriented to person, place, and time.     Deep Tendon Reflexes: Reflexes are normal and symmetric.     Reflex Scores:      Brachioradialis reflexes are 2+ on the right side and 2+ on the left side.      Patellar reflexes are 2+ on the right side and 2+ on the left side. Psychiatric:        Attention and Perception: Attention normal.        Mood and Affect: Mood normal.        Speech: Speech normal.        Behavior: Behavior normal. Behavior is cooperative.        Thought Content: Thought content normal.        Judgment: Judgment normal.    EKG in office today rate 64 with normal axis  Results for orders placed or performed in visit on 08/14/19  Thyroid Panel With TSH  Result Value Ref Range   TSH 2.260 0.450 - 4.500 uIU/mL   T4, Total 8.3 4.5 - 12.0 ug/dL   T3 Uptake Ratio 29 24 - 39 %   Free Thyroxine Index 2.4 1.2 - 4.9      Assessment & Plan:   Problem List Items Addressed This Visit      Other   Fatigue - Primary    Suspect multifactorial with obesity, HTN, HLD, hypothyroid, OSA (with no CPAP use).  Spirometry with FEV1 77% and FEV1/FVC 113%.  EKG borderline.  Recent labs WNL.  Will continue  current medication regimen and place referral to cardiology for further work-up, may benefit from Holter and Echo.        Relevant Orders   EKG 12-Lead (Completed)   Spirometry with Graph (Completed)   Ambulatory referral to Cardiology       Follow up plan: Return in about 4 weeks (around 09/22/2019) for Fatigue and SOB.

## 2019-08-27 ENCOUNTER — Ambulatory Visit (INDEPENDENT_AMBULATORY_CARE_PROVIDER_SITE_OTHER): Payer: Medicare Other | Admitting: Internal Medicine

## 2019-08-27 ENCOUNTER — Encounter: Payer: Self-pay | Admitting: Internal Medicine

## 2019-08-27 ENCOUNTER — Other Ambulatory Visit: Payer: Self-pay

## 2019-08-27 VITALS — BP 142/88 | HR 74 | Ht 66.0 in | Wt 242.2 lb

## 2019-08-27 DIAGNOSIS — I200 Unstable angina: Secondary | ICD-10-CM

## 2019-08-27 DIAGNOSIS — Z01810 Encounter for preprocedural cardiovascular examination: Secondary | ICD-10-CM | POA: Diagnosis not present

## 2019-08-27 DIAGNOSIS — I10 Essential (primary) hypertension: Secondary | ICD-10-CM | POA: Diagnosis not present

## 2019-08-27 DIAGNOSIS — E785 Hyperlipidemia, unspecified: Secondary | ICD-10-CM | POA: Diagnosis not present

## 2019-08-27 MED ORDER — METOPROLOL TARTRATE 25 MG PO TABS: 12.5000 mg | ORAL_TABLET | Freq: Two times a day (BID) | ORAL | 2 refills | Status: DC

## 2019-08-27 MED ORDER — NITROGLYCERIN 0.4 MG SL SUBL: 0.4000 mg | SUBLINGUAL_TABLET | SUBLINGUAL | 2 refills | Status: DC | PRN

## 2019-08-27 MED ORDER — ASPIRIN EC 81 MG PO TBEC: 81.0000 mg | DELAYED_RELEASE_TABLET | Freq: Every day | ORAL | 3 refills | Status: DC

## 2019-08-27 NOTE — Patient Instructions (Addendum)
Medication Instructions:  Your physician has recommended you make the following change in your medication:  1- STOP Hydrochlorothiazide. 2- START Aspirin 81 mg by mouth once a day. 3- START Metoprolol tartrate 12.5 mg (0.5 tablet) by mouth two times a day. 4- Nitroglycerin as needed for chest pain - Dissolve 1 tablet (0.4 mg) under your tongue every 5 minutes as needed for chest pain. Do not take more than 3 doses. If chest pain does not resolve, then call 911 or go to the Emergency Room.    *If you need a refill on your cardiac medications before your next appointment, please call your pharmacy*  Lab Work: Your physician recommends that you return for lab work in: today - BMET. (Must have within 30 days of CTA.)  If you have labs (blood work) drawn today and your tests are completely normal, you will receive your results only by: Marland Kitchen MyChart Message (if you have MyChart) OR . A paper copy in the mail If you have any lab test that is abnormal or we need to change your treatment, we will call you to review the results.  Testing/Procedures: Your physician has requested that you have cardiac CT. Cardiac computed tomography (CT) is a painless test that uses an x-ray machine to take clear, detailed pictures of your heart. For further information please visit [facility]. Please follow instruction sheet as given.  Your cardiac CT will be scheduled at one of the below locations:   [facility] [address] [place], [place] [address] (336) [place] [address] [place],  [address] [phone]  If scheduled at [facility], please arrive at the [facility] main entrance of [facility] 30-45 minutes prior to test start time. Proceed to the [facility] Radiology Department (first floor) to check-in and test prep.  If scheduled at [facility], please arrive 15  mins early for check-in and test prep.  Please follow these instructions carefully (unless otherwise directed):   On the Night Before the Test: . Be sure to Drink plenty of water. . Do not consume any caffeinated/decaffeinated beverages or chocolate 12 hours prior to your test. . Do not take any antihistamines 12 hours prior to your test.   On the Day of the Test: . Drink plenty of water. Do not drink any water within one hour of the test. . Do not eat any food 4 hours prior to the test. . You may take your regular medications prior to the test.  . Take metoprolol (Lopressor) two hours prior to test. . HOLD Furosemide/Hydrochlorothiazide morning of the test. . FEMALES- please wear underwire-free bra if available      After the Test: . Drink plenty of water. . After receiving IV contrast, you may experience a mild flushed feeling. This is normal. . On occasion, you may experience a mild rash up to 24 hours after the test. This is not dangerous. If this occurs, you can take Benadryl 25 mg and increase your fluid intake. . If you experience trouble breathing, this can be serious. If it is severe call 911 IMMEDIATELY. If it is mild, please call our office. . If you take any of these medications: Glipizide/Metformin, Avandament, Glucavance, please do not take 48 hours after completing test unless otherwise instructed.   Once we have confirmed authorization from your insurance company, we will call you to set up a date and time for your test.  For non-scheduling related questions, please contact the cardiac imaging nurse navigator should you have any questions/concerns: Marchia Bond, RN Navigator Cardiac Imaging [facility] [phone] Office     Follow-Up: At [facility], you and your health needs are our priority.  As part of our continuing mission to provide you with exceptional heart care, we have created designated Provider Care Teams.  These Care  Teams include your primary Cardiologist (physician) and Advanced Practice Providers (APPs -  Physician Assistants and Nurse Practitioners) who all work together to provide you with the care you need, when you need it.  Your next appointment:   1 month(s)  The format for your next appointment:   In Person  Provider:    You may see DR Harrell Gave END or one of the following Advanced Practice Providers on your designated Care Team:    Murray Hodgkins, NP  Christell Faith, PA-C  Marrianne Mood, PA-C    Cardiac CT Angiogram  A cardiac CT angiogram is a procedure to look at the heart and the area around the heart. It may be done to help find the cause of chest pains or other symptoms of heart disease. During this procedure, a large X-ray machine, called a CT scanner, takes detailed pictures of the heart and the surrounding area after a dye (contrast material) has been injected into blood vessels in the area. The procedure is also sometimes called a coronary CT angiogram, coronary artery scanning, or CTA. A cardiac CT angiogram allows the health care provider to see how well blood is flowing to and from the heart. The health care provider will be able to see if there are any problems, such as:  Blockage or narrowing of the coronary arteries in the heart.  Fluid around the heart.  Signs of weakness or disease in the muscles, valves, and tissues of the heart. Tell a health care provider about:  Any allergies you have. This is especially important if you have had a previous allergic reaction to contrast dye.  All medicines you are taking, including vitamins, herbs, eye drops, creams, and over-the-counter medicines.  Any blood disorders you have.  Any surgeries you have had.  Any medical conditions you have.  Whether you are pregnant or may be pregnant.  Any anxiety disorders, chronic pain, or other conditions you have that may increase your stress or prevent you from lying  still. What are the risks? Generally, this is a safe procedure. However, problems may occur, including:  Bleeding.  Infection.  Allergic reactions to medicines or dyes.  Damage to other structures or organs.  Kidney damage from the dye or contrast that is used.  Increased risk of cancer from radiation exposure. This risk is low. Talk with your health care provider about: ? The risks and benefits of testing. ? How you can receive the lowest dose of radiation. What happens before the procedure?  Wear comfortable clothing and remove any jewelry, glasses, dentures, and hearing aids.  Follow instructions from your health care provider about eating and drinking. This may include: ? For 12 hours before the test - avoid caffeine. This includes tea, coffee, soda, energy drinks, and diet pills. Drink plenty of water or other fluids that do not have caffeine in them. Being well-hydrated can prevent complications. ? For 4-6 hours before the test - stop eating and drinking. The contrast dye can cause nausea, but this is less likely if your stomach is empty.  Ask your health care provider  about changing or stopping your regular medicines. This is especially important if you are taking diabetes medicines, blood thinners, or medicines to treat erectile dysfunction. What happens during the procedure?  Hair on your chest may need to be removed so that small sticky patches called electrodes can be placed on your chest. These will transmit information that helps to monitor your heart during the test.  An IV tube will be inserted into one of your veins.  You might be given a medicine to control your heart rate during the test. This will help to ensure that good images are obtained.  You will be asked to lie on an exam table. This table will slide in and out of the CT machine during the procedure.  Contrast dye will be injected into the IV tube. You might feel warm, or you may get a metallic taste in  your mouth.  You will be given a medicine (nitroglycerin) to relax (dilate) the arteries in your heart.  The table that you are lying on will move into the CT machine tunnel for the scan.  The person running the machine will give you instructions while the scans are being done. You may be asked to: ? Keep your arms above your head. ? Hold your breath. ? Stay very still, even if the table is moving.  When the scanning is complete, you will be moved out of the machine.  The IV tube will be removed. The procedure may vary among health care providers and hospitals. What happens after the procedure?  You might feel warm, or you may get a metallic taste in your mouth from the contrast dye.  You may have a headache from the nitroglycerin.  After the procedure, drink water or other fluids to wash (flush) the contrast material out of your body.  Contact a health care provider if you have any symptoms of allergy to the contrast. These symptoms include: ? Shortness of breath. ? Rash or hives. ? A racing heartbeat.  Most people can return to their normal activities right after the procedure. Ask your health care provider what activities are safe for you.  It is up to you to get the results of your procedure. Ask your health care provider, or the department that is doing the procedure, when your results will be ready. Summary  A cardiac CT angiogram is a procedure to look at the heart and the area around the heart. It may be done to help find the cause of chest pains or other symptoms of heart disease.  During this procedure, a large X-ray machine, called a CT scanner, takes detailed pictures of the heart and the surrounding area after a dye (contrast material) has been injected into blood vessels in the area.  Ask your health care provider about changing or stopping your regular medicines before the procedure. This is especially important if you are taking diabetes medicines, blood thinners,  or medicines to treat erectile dysfunction.  After the procedure, drink water or other fluids to wash (flush) the contrast material out of your body. This information is not intended to replace advice given to you by your health care provider. Make sure you discuss any questions you have with your health care provider. Document Released: 08/24/2008 Document Revised: 08/24/2017 Document Reviewed: 07/31/2016 Elsevier Patient Education  2020 [employer].

## 2019-08-27 NOTE — Progress Notes (Signed)
New Outpatient Visit Date: 08/27/2019  Referring Provider: Venita Lick, NP [address] [place],  [place] [address]  Chief Complaint: Fatigue and chest pain  HPI:  Tiffany Delacruz is a 68 y.o. female who is being seen today for the evaluation of fatigue, hypertension, and hyperlipidemia at the request of Ms. Cannady. She has a history of hypertension, hyperlipidemia, Graves' disease with subsequent hypothyroidism, obstructive sleep apnea (not compliant with CPAP), GERD, and depression/anxiety.  Ms. Nicole Kindred recently saw Ms. Cannady at which time she reported fatigue over the last year.  Upon further questioning, she also revealed occasional chest pain over the last year.  Today, Ms. Haughney reports that she began feeling more short of breath and tired with activity around the time that COVID-19 pandemic began.  This has continued to worsen, though Ms. Grygiel wonders if being less active may be contributing to this.  Over the last few months, she has also noticed occasional chest discomfort.  The pain is sharp and located just to the left of the sternum.  It usually comes on with activity and eases off gradually over the course of an hour with rest.  She has not had any symptoms at rest.  She denies palpitations, and orthopnea.  She has chronic calf edema, left worse than right, that she attributes to chronic venous insufficiency.  She also notes frequent dizziness.  She has a history of vertigo.  Ms. Bignell underwent stress testing with Dr. Clayborn Bigness ~7 years ago in anticipation of right knee replacement.  She believes that stress test was normal.  --------------------------------------------------------------------------------------------------  Cardiovascular History & Procedures: Cardiovascular Problems:  Fatigue  Chest pain  Risk Factors:  Hypertension, hyperlipidemia, obesity, and age greater than 78  Cath/PCI:  None  CV Surgery:  None  EP Procedures and  Devices:  None  Non-Invasive Evaluation(s):  None  Recent CV Pertinent Labs: Lab Results  Component Value Date   CHOL 202 (H) 06/27/2019   CHOL 187 04/16/2015   HDL 55 06/27/2019   LDLCALC 130 (H) 06/27/2019   TRIG 95 06/27/2019   TRIG 95 04/16/2015   CHOLHDL 3.6 05/13/2018   INR 1.0 06/02/2013   BNP 29.4 06/27/2019   K 4.1 06/27/2019   K 3.6 06/18/2013   MG 2.1 06/27/2019   BUN 29 (H) 06/27/2019   BUN 14 06/18/2013   CREATININE 0.92 06/27/2019   CREATININE 0.96 06/18/2013    --------------------------------------------------------------------------------------------------  Past Medical History:  Diagnosis Date   Anxiety    Depression    GERD (gastroesophageal reflux disease)    Graves disease    Hyperlipidemia    Hypertension    Hypothyroidism    Obesity    Osteoarthritis    Sleep apnea    NO CPAP   Wheezing 07/14/2018    Past Surgical History:  Procedure Laterality Date   BACK SURGERY  1988   CARPAL TUNNEL RELEASE Left 07/20/2017   Procedure: CARPAL TUNNEL RELEASE;  Surgeon: Dereck Leep, MD;  Location: [facility] ORS;  Service: Orthopedics;  Laterality: Left;   cataracts surgery     COLONOSCOPY     disk repair     EYE SURGERY Bilateral 10/10/2018   JOINT REPLACEMENT Right 05/2013   knee replacement    Current Meds  Medication Sig   albuterol (PROVENTIL HFA;VENTOLIN HFA) 108 (90 Base) MCG/ACT inhaler Inhale 2 puffs into the lungs every 6 (six) hours as needed for wheezing or shortness of breath.   b complex vitamins tablet Take 1 tablet  by mouth daily.   famotidine (PEPCID) 20 MG tablet Take 20 mg by mouth daily after lunch.    FLUoxetine (PROZAC) 20 MG tablet Take 1 tablet (20 mg total) by mouth daily.   fluticasone (FLONASE) 50 MCG/ACT nasal spray SPRAY 2 SPRAYS INTO EACH NOSTRIL EVERY DAY   levothyroxine (SYNTHROID) 150 MCG tablet Take 1 tablet (150 mcg total) by mouth daily.   losartan (COZAAR) 100 MG tablet Take 1 tablet  (100 mg total) by mouth daily.   Multiple Vitamins-Minerals (ZINC PO) Take by mouth daily.   [DISCONTINUED] hydrochlorothiazide (HYDRODIURIL) 12.5 MG tablet Take 1 tablet (12.5 mg total) by mouth daily.    Allergies: Patient has no known allergies.  Social History   Tobacco Use   Smoking status: Never Smoker   Smokeless tobacco: Never Used  Substance Use Topics   Alcohol use: No    Alcohol/week: 0.0 standard drinks   Drug use: No    Family History  Problem Relation Age of Onset   Uterine cancer Mother    Colon cancer Mother    Heart disease Father    Alcohol abuse Father    Heart attack Father 46   Rheum arthritis Sister    Diabetes Maternal Grandmother    Rheum arthritis Brother     Review of Systems: Mental acuity has gradually worsened over the last year.  Otherwise, a 12-system review of systems was performed and was negative except as noted in the HPI.  --------------------------------------------------------------------------------------------------  Physical Exam: BP (!) 142/88 (BP Location: Right Arm, Patient Position: Sitting, Cuff Size: Large)    Pulse 74    Ht 5\' 6"  (1.676 m)    Wt 242 lb 4 oz (109.9 kg)    LMP 12/15/1991 (Approximate)    SpO2 98%    BMI 39.10 kg/m   General:  NAD HEENT: No conjunctival pallor or scleral icterus. Facemask in place. Neck: Supple without lymphadenopathy, thyromegaly, JVD, or HJR. No carotid bruit. Lungs: Normal work of breathing. Clear to auscultation bilaterally without wheezes or crackles. Heart: Regular rate and rhythm without murmurs, rubs, or gallops. Non-displaced PMI. Abd: Bowel sounds present. Soft, NT/ND without hepatosplenomegaly Ext: 1+ pretibial edema bilaterally with varicose veins noted. Radial, PT, and DP pulses are 2+ bilaterally Skin: Warm and dry without rash. Neuro: CNIII-XII intact. Strength and fine-touch sensation intact in upper and lower extremities bilaterally. Psych: Normal mood and  affect.  EKG:  NSR with nonspecific T wave changes.  Lab Results  Component Value Date   WBC 5.6 06/27/2019   HGB 13.5 06/27/2019   HCT 39.5 06/27/2019   MCV 84 06/27/2019   PLT 233 06/27/2019    Lab Results  Component Value Date   NA 140 06/27/2019   K 4.1 06/27/2019   CL 100 06/27/2019   CO2 25 06/27/2019   BUN 29 (H) 06/27/2019   CREATININE 0.92 06/27/2019   GLUCOSE 112 (H) 06/27/2019   ALT 9 06/27/2019    Lab Results  Component Value Date   CHOL 202 (H) 06/27/2019   HDL 55 06/27/2019   LDLCALC 130 (H) 06/27/2019   TRIG 95 06/27/2019   CHOLHDL 3.6 05/13/2018     --------------------------------------------------------------------------------------------------  ASSESSMENT AND PLAN: Accelerating angina: Ms. Czar reports increasing fatigue over the last 9 months with occasional exertional chest pain over the last few months.  EKG today shows subtle nonspecific T wave changes.  Risk factors include hypertension, hyperlipidemia, age, and obesity.  We have discussed invasive and noninvasive testing options and have  agreed to obtain a cardiac CTA for further evaluation.  In the meantime, we will start metoprolol tartrate 12.5 mg BID and ASA 81 mg daily.  I have also provided Ms. Morawski with a prescription for sublingual nitroglycerin.  I advised her to seek immediate medical attention if she has recurrent chest pain that does not resolve promptly with rest and/or sublingual nitroglycerin.  Hypertension: Blood pressure mildly elevated today but low normal at recent outside office visits.  We have agreed to add metoprolol, as above, and discontinue HCTZ.  We will continue current dose of losartan.  Hyperlipidemia: LDL 130 on last check in October.  If Ms. Schopf has evidence of CAD on CTA, high-intensity statin therapy will need to be added.  Follow-up: Return to clinic in 1 month.  Nelva Bush, MD 08/28/2019 1:25 PM

## 2019-08-28 ENCOUNTER — Encounter: Payer: Self-pay | Admitting: Internal Medicine

## 2019-08-28 DIAGNOSIS — I200 Unstable angina: Secondary | ICD-10-CM | POA: Insufficient documentation

## 2019-08-29 LAB — BASIC METABOLIC PANEL
BUN/Creatinine Ratio: 25 (ref 12–28)
BUN: 21 mg/dL (ref 8–27)
CO2: 27 mmol/L (ref 20–29)
Calcium: 9.2 mg/dL (ref 8.7–10.3)
Chloride: 97 mmol/L (ref 96–106)
Creatinine, Ser: 0.83 mg/dL (ref 0.57–1.00)
GFR calc Af Amer: 83 mL/min/{1.73_m2} (ref >59)
GFR calc non Af Amer: 72 mL/min/{1.73_m2} (ref >59)
Glucose: 89 mg/dL (ref 65–99)
Potassium: 4.6 mmol/L (ref 3.5–5.2)
Sodium: 138 mmol/L (ref 134–144)

## 2019-09-12 ENCOUNTER — Other Ambulatory Visit: Payer: Self-pay | Admitting: Nurse Practitioner

## 2019-09-12 ENCOUNTER — Telehealth: Payer: Self-pay

## 2019-09-12 DIAGNOSIS — Z1231 Encounter for screening mammogram for malignant neoplasm of breast: Secondary | ICD-10-CM

## 2019-09-12 DIAGNOSIS — N6310 Unspecified lump in the right breast, unspecified quadrant: Secondary | ICD-10-CM

## 2019-09-12 NOTE — Telephone Encounter (Signed)
I have placed orders since I am out next week, however please ensure she schedules to see me following week for assessment.  Thank you.

## 2019-09-12 NOTE — Telephone Encounter (Signed)
Called and let patient know that her orders have been entered. Patient states she will back to schedule.

## 2019-09-12 NOTE — Progress Notes (Signed)
Patient called in reporting finding mass to right breast at 3 o'clock position.  Orders placed per request to [facility].

## 2019-09-12 NOTE — Telephone Encounter (Signed)
Copied from [place] [phone]. Topic: Referral - Question >> Sep 12, 2019  9:06 AM Percell Belt A wrote: Reason for CRM: pt called in and would like to see if Dr can put in order for a Diagnostic mammogram on the right breast. She has found a knot   Pt normally goes to the [facility] breast center in [place].   Called patient. She states that the knot is at 3 o'clock on her breast.   Tried calling [facility] to see exactly what orders need to be entered for this but they did not answer. I think they need a diagnostic bilateral and ultrasounds on both breast with a knot.

## 2019-09-15 ENCOUNTER — Other Ambulatory Visit: Payer: Self-pay | Admitting: *Deleted

## 2019-09-15 ENCOUNTER — Inpatient Hospital Stay
Admission: RE | Admit: 2019-09-15 | Discharge: 2019-09-15 | Disposition: A | Discharge status: Home / Self Care | Payer: Self-pay | Source: Ambulatory Visit | Attending: *Deleted | Admitting: *Deleted

## 2019-09-15 DIAGNOSIS — Z1231 Encounter for screening mammogram for malignant neoplasm of breast: Secondary | ICD-10-CM

## 2019-09-23 ENCOUNTER — Telehealth: Payer: Self-pay | Admitting: *Deleted

## 2019-09-23 ENCOUNTER — Ambulatory Visit: Payer: Medicare Other | Admitting: Nurse Practitioner

## 2019-09-23 DIAGNOSIS — I200 Unstable angina: Secondary | ICD-10-CM

## 2019-09-23 DIAGNOSIS — Z01810 Encounter for preprocedural cardiovascular examination: Secondary | ICD-10-CM

## 2019-09-23 DIAGNOSIS — I10 Essential (primary) hypertension: Secondary | ICD-10-CM

## 2019-09-23 NOTE — Telephone Encounter (Signed)
Patient has upcoming Coronary CT and will need BMET prior to. She also needs f/u with Dr End rearranged to after the CT.  Called patient and she verbalized understanding to go to the [facility] in the next week or 2 (prior to scheduled CT) for lab work. We also rescheduled f/u appointment with Dr End and she is aware of updated appointment date and time. BMET order entered.

## 2019-09-29 ENCOUNTER — Ambulatory Visit
Admission: RE | Admit: 2019-09-29 | Discharge: 2019-09-29 | Disposition: A | Discharge status: Home / Self Care | Payer: Medicare PPO | Source: Ambulatory Visit | Attending: Nurse Practitioner | Admitting: Nurse Practitioner

## 2019-09-29 DIAGNOSIS — N6310 Unspecified lump in the right breast, unspecified quadrant: Secondary | ICD-10-CM

## 2019-09-29 DIAGNOSIS — Z1231 Encounter for screening mammogram for malignant neoplasm of breast: Secondary | ICD-10-CM

## 2019-09-29 DIAGNOSIS — N6002 Solitary cyst of left breast: Secondary | ICD-10-CM | POA: Insufficient documentation

## 2019-09-29 DIAGNOSIS — D1739 Benign lipomatous neoplasm of skin and subcutaneous tissue of other sites: Secondary | ICD-10-CM | POA: Insufficient documentation

## 2019-10-01 ENCOUNTER — Ambulatory Visit: Payer: Medicare Other | Admitting: Internal Medicine

## 2019-10-02 ENCOUNTER — Other Ambulatory Visit: Payer: Self-pay | Admitting: Nurse Practitioner

## 2019-10-06 ENCOUNTER — Encounter (HOSPITAL_COMMUNITY): Payer: Self-pay

## 2019-10-07 ENCOUNTER — Other Ambulatory Visit
Admission: RE | Admit: 2019-10-07 | Discharge: 2019-10-07 | Disposition: A | Discharge status: Home / Self Care | Payer: Medicare PPO | Source: Ambulatory Visit | Attending: Internal Medicine | Admitting: Internal Medicine

## 2019-10-07 ENCOUNTER — Telehealth (HOSPITAL_COMMUNITY): Payer: Self-pay | Admitting: Emergency Medicine

## 2019-10-07 DIAGNOSIS — Z01810 Encounter for preprocedural cardiovascular examination: Secondary | ICD-10-CM | POA: Diagnosis present

## 2019-10-07 DIAGNOSIS — I200 Unstable angina: Secondary | ICD-10-CM | POA: Diagnosis present

## 2019-10-07 LAB — BASIC METABOLIC PANEL
Anion gap: 7 (ref 5–15)
BUN: 22 mg/dL (ref 8–23)
CO2: 28 mmol/L (ref 22–32)
Calcium: 8.8 mg/dL — ABNORMAL LOW (ref 8.9–10.3)
Chloride: 102 mmol/L (ref 98–111)
Creatinine, Ser: 0.87 mg/dL (ref 0.44–1.00)
GFR calc Af Amer: >60 mL/min (ref >60)
GFR calc non Af Amer: >60 mL/min (ref >60)
Glucose, Bld: 88 mg/dL (ref 70–99)
Potassium: 3.9 mmol/L (ref 3.5–5.1)
Sodium: 137 mmol/L (ref 135–145)

## 2019-10-07 NOTE — Telephone Encounter (Signed)
Reaching out to patient to offer assistance regarding upcoming cardiac imaging study; pt verbalizes understanding of appt date/time, parking situation and where to check in, pre-test NPO status and medications ordered, and verified current allergies; name and call back number provided for further questions should they arise Tiffany Viramontes RN Navigator Cardiac Imaging [facility] [phone] office [phone] cell 

## 2019-10-09 ENCOUNTER — Other Ambulatory Visit: Payer: Self-pay

## 2019-10-09 ENCOUNTER — Ambulatory Visit
Admission: RE | Admit: 2019-10-09 | Discharge: 2019-10-09 | Disposition: A | Discharge status: Home / Self Care | Payer: Medicare PPO | Source: Ambulatory Visit | Attending: Internal Medicine | Admitting: Internal Medicine

## 2019-10-09 DIAGNOSIS — I200 Unstable angina: Secondary | ICD-10-CM

## 2019-10-09 MED ORDER — NITROGLYCERIN 0.4 MG SL SUBL
0.8000 mg | SUBLINGUAL_TABLET | Freq: Once | SUBLINGUAL | Status: AC
  Administered 2019-10-09: 0.8 mg via SUBLINGUAL

## 2019-10-09 MED ORDER — IOHEXOL 350 MG/ML SOLN
125.0000 mL | Freq: Once | INTRAVENOUS | Status: AC | PRN
  Administered 2019-10-09: 08:00:00 125 mL via INTRAVENOUS

## 2019-10-09 NOTE — Progress Notes (Signed)
Patient tolerated CT without incident. Did not want anything to eat or drink after. Ambulatory steady gait to exit.

## 2019-10-17 ENCOUNTER — Ambulatory Visit: Payer: Medicare Other | Admitting: Internal Medicine

## 2019-10-17 ENCOUNTER — Encounter: Payer: Self-pay | Admitting: Nurse Practitioner

## 2019-10-17 ENCOUNTER — Ambulatory Visit: Payer: Medicare PPO | Admitting: Nurse Practitioner

## 2019-10-17 ENCOUNTER — Other Ambulatory Visit: Payer: Self-pay

## 2019-10-17 VITALS — BP 136/81 | HR 59 | Temp 97.7°F

## 2019-10-17 DIAGNOSIS — R5383 Other fatigue: Secondary | ICD-10-CM | POA: Diagnosis not present

## 2019-10-17 DIAGNOSIS — M2540 Effusion, unspecified joint: Secondary | ICD-10-CM

## 2019-10-17 NOTE — Patient Instructions (Signed)
Joint Pain  Joint pain can be caused by many things. It is likely to go away if you follow instructions from your doctor for taking care of yourself at home. Sometimes, you may need more treatment. Follow these instructions at home: Managing pain, stiffness, and swelling   If told, put ice on the painful area. ? Put ice in a plastic bag. ? Place a towel between your skin and the bag. ? Leave the ice on for 20 minutes, 2-3 times a day.  If told, put heat on the painful area. Do this as often as told by your doctor. Use the heat source that your doctor recommends, such as a moist heat pack or a heating pad. ? Place a towel between your skin and the heat source. ? Leave the heat on for 20-30 minutes. ? Take off the heat if your skin gets bright red. This is especially important if you are unable to feel pain, heat, or cold. You may have a greater risk of getting burned.  Move your fingers or toes below the painful joint often. This helps with stiffness and swelling.  If possible, raise (elevate) the painful joint above the level of your heart while you are sitting or lying down. To do this, try putting a few pillows under the painful joint. Activity  Rest the painful joint for as long as told. Do not do things that cause pain or make your pain worse.  Begin exercising or stretching the affected area, as told by your doctor. Ask your doctor what types of exercise are safe for you. If you have an elastic bandage, sling, or splint:  Wear the device as told by your doctor. Take it off only as told by your doctor.  Loosen the device if your fingers or toes below the joint: ? Tingle. ? Lose feeling (get numb). ? Get cold and blue.  Keep the device clean.  Ask your doctor if you should take off the device before bathing. You may need to cover it with a watertight covering when you take a bath or a shower. General instructions  Take over-the-counter and prescription medicines only as told  by your doctor.  Do not use any products that contain nicotine or tobacco. These include cigarettes and e-cigarettes. If you need help quitting, ask your doctor.  Keep all follow-up visits as told by your doctor. This is important. Contact a doctor if:  You have pain that gets worse and does not get better with medicine.  Your joint pain does not get better in 3 days.  You have more bruising or swelling.  You have a fever.  You lose 10 lb (4.5 kg) or more without trying. Get help right away if:  You cannot move the joint.  Your fingers or toes tingle, lose feeling, or get cold and blue.  You have a fever along with a joint that is red, warm, and swollen. Summary  Joint pain can be caused by many things. It often goes away if you follow instructions from your doctor for taking care of yourself at home.  Rest the painful joint for as long as told. Do not do things that cause pain or make your pain worse.  Take over-the-counter and prescription medicines only as told by your doctor. This information is not intended to replace advice given to you by your health care provider. Make sure you discuss any questions you have with your health care provider. Document Revised: 08/24/2017 Document Reviewed: 06/27/2017 Elsevier  Patient Education  [employer].

## 2019-10-17 NOTE — Progress Notes (Signed)
BP 136/81 (BP Location: Left Arm, Patient Position: Sitting, Cuff Size: Normal)   Pulse (!) 59   Temp 97.7 F (36.5 C) (Oral)   LMP 12/15/1991 (Approximate)   SpO2 96%    Subjective:    Patient ID: Tiffany Delacruz, female    DOB: November 15, 1950, 69 y.o.   MRN: [id number]  HPI: Tiffany Delacruz is a 69 y.o. female  Chief Complaint  Patient presents with  . Fatigue  . Shortness of Breath   FATIGUE Present for about one year with occasional CP over past year, about once a month, to left side.  Lasts < 1 minute and then goes away.  No N&V, diaphoresis, back pain, or shoulder pain with it.. Can not perform any strenuous activity. If goes long distances has to rest or stand still. Feels exhausted. Walking 50 feet makes her tired and has some SOB, does endorse some wheezing with this. Denies CP, palpitations, diaphoresis, or N&V. Has been diagnosed with OSA, but does not use machine as reports she does not like mask. Had sleep study in [place] years ago. Has not used CPAP in 2 years, refuses. Recently had Levothyroxine dose reduction 150 MCG, which she reports some improvement with.  No history of smoking.  Denies alcohol use.  Never worked in [employer].  Did live with a smoker for several years.  Denies recent URI or acute illness, although was sick at beginning of 2020.     She is seeing cardiology, last saw Dr. Saunders Revel 08/27/2019.  They started her on Metoprolol 12.5 MG BID and ASA 81 MG.   She reports this has not helped and does continue to have some chest discomfort.  She was given NTG, did use it once and this did help chest pain.  Was to see cardiology today, but reports she cancelled this so she could see PCP.  She does not feel this is related to her heart, states she looked online.  Has scaly skin, and periodic swelling of joints, SOB, fatigue, thought processes diminished, when tries to walk her buttocks get stiff, muscle spasms in hands, and pain in eyes.  She does have  history of tick bites, last one a year ago.  She has a brother and sister with RA and is concerned this is autoimmune.   Duration:months Severity:moderate Onset:gradual Context when symptoms started:unknown Symptoms improve with rest:yes Depressive symptoms:no Stress/anxiety:no Insomnia:none, sleeps about 6-8 hours Snoring:yes Observed apnea by bed partner:no Daytime hypersomnolence:no Wakes feeling refreshed:no History of sleep study:yes Dysnea on exertion:at times Orthopnea/PND:no Chest pain:no Chronic cough:no Lower extremity edema:a little bit on and off Arthralgias:yes Myalgias:no Weakness:no Rash:no  Relevant past medical, surgical, family and social history reviewed and updated as indicated. Interim medical history since our last visit reviewed. Allergies and medications reviewed and updated.  Review of Systems  Constitutional: Positive for fatigue. Negative for activity change, appetite change, diaphoresis and fever.  Respiratory: Positive for shortness of breath and wheezing (occasional). Negative for cough and chest tightness.   Cardiovascular: Positive for chest pain (once a month or less) and leg swelling (intermittent). Negative for palpitations.  Gastrointestinal: Negative for abdominal distention, abdominal pain, constipation, diarrhea, nausea and vomiting.  Endocrine: Negative for cold intolerance and heat intolerance.  Musculoskeletal: Positive for arthralgias.  Skin: Negative for rash.  Neurological: Negative for dizziness, syncope, weakness, light-headedness, numbness and headaches.  Psychiatric/Behavioral: Negative.     Per HPI unless specifically indicated above     Objective:    BP 136/81 (  BP Location: Left Arm, Patient Position: Sitting, Cuff Size: Normal)   Pulse (!) 59   Temp 97.7 F (36.5 C) (Oral)   LMP 12/15/1991 (Approximate)   SpO2 96%   Wt Readings from Last 3 Encounters:  08/27/19 242 lb 4 oz (109.9 kg)    06/27/19 238 lb (108 kg)  11/14/18 237 lb 8 oz (107.7 kg)    Physical Exam Vitals and nursing note reviewed.  Constitutional:      General: She is awake. She is not in acute distress.    Appearance: She is well-developed. She is obese. She is not ill-appearing.  HENT:     Head: Normocephalic.     Right Ear: Hearing normal.     Left Ear: Hearing normal.  Eyes:     General: Lids are normal.        Right eye: No discharge.        Left eye: No discharge.     Conjunctiva/sclera: Conjunctivae normal.     Pupils: Pupils are equal, round, and reactive to light.  Neck:     Thyroid: No thyromegaly.     Vascular: No carotid bruit or JVD.  Cardiovascular:     Rate and Rhythm: Normal rate and regular rhythm.     Pulses:          Dorsalis pedis pulses are 2+ on the right side and 2+ on the left side.       Posterior tibial pulses are 2+ on the right side and 2+ on the left side.     Heart sounds: Normal heart sounds. No murmur. No gallop.   Pulmonary:     Effort: Pulmonary effort is normal. No accessory muscle usage or respiratory distress.     Breath sounds: Normal breath sounds.  Abdominal:     General: Bowel sounds are normal.     Palpations: Abdomen is soft. There is no hepatomegaly or splenomegaly.     Tenderness: There is no abdominal tenderness.  Musculoskeletal:     Cervical back: Normal range of motion and neck supple.     Right lower leg: No edema.     Left lower leg: No edema.     Comments: Full ROM upper and lower extremities, no swelling noted to hands on exam.  Feet:     Right foot:     Protective Sensation: 10 sites tested. 10 sites sensed.     Skin integrity: Dry skin present.     Left foot:     Protective Sensation: 10 sites tested. 10 sites sensed.     Skin integrity: Dry skin present.  Lymphadenopathy:     Head:     Right side of head: No submental, submandibular, tonsillar, preauricular or posterior auricular adenopathy.     Left side of head: No submental,  submandibular, tonsillar, preauricular or posterior auricular adenopathy.  Skin:    General: Skin is warm and dry.  Neurological:     Mental Status: She is alert and oriented to person, place, and time.     Cranial Nerves: Cranial nerves are intact.     Gait: Gait is intact.     Deep Tendon Reflexes: Reflexes are normal and symmetric.     Reflex Scores:      Brachioradialis reflexes are 2+ on the right side and 2+ on the left side.      Patellar reflexes are 2+ on the right side and 2+ on the left side. Psychiatric:  Attention and Perception: Attention normal.        Mood and Affect: Mood normal.        Speech: Speech normal.        Behavior: Behavior normal. Behavior is cooperative.        Thought Content: Thought content normal.        Judgment: Judgment normal.     Results for orders placed or performed during the hospital encounter of 10/04/01  Basic Metabolic Panel (BMET)  Result Value Ref Range   Sodium 137 135 - 145 mmol/L   Potassium 3.9 3.5 - 5.1 mmol/L   Chloride 102 98 - 111 mmol/L   CO2 28 22 - 32 mmol/L   Glucose, Bld 88 70 - 99 mg/dL   BUN 22 8 - 23 mg/dL   Creatinine, Ser 0.87 0.44 - 1.00 mg/dL   Calcium 8.8 (L) 8.9 - 10.3 mg/dL   GFR calc non Af Amer >60 >60 mL/min   GFR calc Af Amer >60 >60 mL/min   Anion gap 7 5 - 15      Assessment & Plan:   Problem List Items Addressed This Visit      Other   Fatigue - Primary    Suspect multifactorial with obesity, HTN, HLD, hypothyroid, OSA (with no CPAP use, refuses).  Will continue current medication regimen and collaboration with cardiology, discussed with her need to maintain these visits as suspect some angina present as NTG did relieve episode of chest pain.  Check ANA, RF, CRP, ESR, CBC, uric acid, and Lyme today.  Will also check Covid antibody testing.  Recommend focus on modest weight loss, which would benefit overall health.  Return in 2 weeks or sooner if worsening.      Relevant Orders   SAR CoV2  Serology (COVID 19)AB(IGG)IA    Other Visit Diagnoses    Joint swelling       Check labs today for autoimmune or other cause of intermittent joint swelling, pain, and fatigue.     Relevant Orders   ANA w/Reflex if Positive   CBC With Differential/Platelet   Rheumatoid factor   Uric acid   Sed Rate (ESR)   C-reactive protein   Lyme disease, western blot       Follow up plan: Return in about 2 weeks (around 10/31/2019) for Fatigue and SOB.

## 2019-10-17 NOTE — Assessment & Plan Note (Addendum)
Suspect multifactorial with obesity, HTN, HLD, hypothyroid, OSA (with no CPAP use, refuses).  Will continue current medication regimen and collaboration with cardiology, discussed with her need to maintain these visits as suspect some angina present as NTG did relieve episode of chest pain.  Check ANA, RF, CRP, ESR, CBC, uric acid, and Lyme today.  Will also check Covid antibody testing.  Recommend focus on modest weight loss, which would benefit overall health.  Return in 2 weeks or sooner if worsening.

## 2019-10-18 LAB — CBC WITH DIFFERENTIAL/PLATELET
Basophils Absolute: 0.1 10*3/uL (ref 0.0–0.2)
Basos: 1 %
EOS (ABSOLUTE): 0.2 10*3/uL (ref 0.0–0.4)
Eos: 4 %
Hematocrit: 40.6 % (ref 34.0–46.6)
Hemoglobin: 13.8 g/dL (ref 11.1–15.9)
Immature Grans (Abs): 0 10*3/uL (ref 0.0–0.1)
Immature Granulocytes: 1 %
Lymphocytes Absolute: 1.4 10*3/uL (ref 0.7–3.1)
Lymphs: 27 %
MCH: 29.7 pg (ref 26.6–33.0)
MCHC: 34 g/dL (ref 31.5–35.7)
MCV: 88 fL (ref 79–97)
Monocytes Absolute: 0.4 10*3/uL (ref 0.1–0.9)
Monocytes: 8 %
Neutrophils Absolute: 3.1 10*3/uL (ref 1.4–7.0)
Neutrophils: 59 %
Platelets: 207 10*3/uL (ref 150–450)
RBC: 4.64 x10E6/uL (ref 3.77–5.28)
RDW: 13.6 % (ref 11.7–15.4)
WBC: 5.2 10*3/uL (ref 3.4–10.8)

## 2019-10-18 LAB — SPECIMEN STATUS REPORT

## 2019-10-19 NOTE — Progress Notes (Signed)
Contacted via MyChart

## 2019-10-22 ENCOUNTER — Encounter: Payer: Self-pay | Admitting: Internal Medicine

## 2019-10-22 ENCOUNTER — Other Ambulatory Visit: Payer: Self-pay

## 2019-10-22 ENCOUNTER — Ambulatory Visit (INDEPENDENT_AMBULATORY_CARE_PROVIDER_SITE_OTHER): Payer: Medicare PPO | Admitting: Internal Medicine

## 2019-10-22 VITALS — BP 130/90 | HR 61 | Ht 66.0 in | Wt 246.8 lb

## 2019-10-22 DIAGNOSIS — R0600 Dyspnea, unspecified: Secondary | ICD-10-CM | POA: Diagnosis not present

## 2019-10-22 DIAGNOSIS — I10 Essential (primary) hypertension: Secondary | ICD-10-CM

## 2019-10-22 DIAGNOSIS — I700 Atherosclerosis of aorta: Secondary | ICD-10-CM

## 2019-10-22 DIAGNOSIS — R0609 Other forms of dyspnea: Secondary | ICD-10-CM

## 2019-10-22 MED ORDER — FUROSEMIDE 20 MG PO TABS: 20.0000 mg | ORAL_TABLET | Freq: Every day | ORAL | 4 refills | Status: DC

## 2019-10-22 NOTE — Patient Instructions (Signed)
Medication Instructions:  Your physician has recommended you make the following change in your medication:  1- START Furosemide 20 mg (1 tablet) by mouth once a day.  *If you need a refill on your cardiac medications before your next appointment, please call your pharmacy*  Lab Work: none If you have labs (blood work) drawn today and your tests are completely normal, you will receive your results only by: Marland Kitchen MyChart Message (if you have MyChart) OR . A paper copy in the mail If you have any lab test that is abnormal or we need to change your treatment, we will call you to review the results.  Testing/Procedures: Your physician has requested that you have an echocardiogram. Echocardiography is a painless test that uses sound waves to create images of your heart. It provides your doctor with information about the size and shape of your heart and how well your heart's chambers and valves are working. This procedure takes approximately one hour. There are no restrictions for this procedure. You may get an IV, if needed, to receive an ultrasound enhancing agent through to better visualize your heart.   Follow-Up: At [facility], you and your health needs are our priority.  As part of our continuing mission to provide you with exceptional heart care, we have created designated Provider Care Teams.  These Care Teams include your primary Cardiologist (physician) and Advanced Practice Providers (APPs -  Physician Assistants and Nurse Practitioners) who all work together to provide you with the care you need, when you need it.  Your next appointment:   6 week(s)  The format for your next appointment:   In Person  Provider:    You may see DR Harrell Gave END or one of the following Advanced Practice Providers on your designated Care Team:    Murray Hodgkins, NP  Christell Faith, PA-C  Marrianne Mood, PA-C   Echocardiogram An echocardiogram is a procedure that uses painless sound waves  (ultrasound) to produce an image of the heart. Images from an echocardiogram can provide important information about:  Signs of coronary artery disease (CAD).  Aneurysm detection. An aneurysm is a weak or damaged part of an artery wall that bulges out from the normal force of blood pumping through the body.  Heart size and shape. Changes in the size or shape of the heart can be associated with certain conditions, including heart failure, aneurysm, and CAD.  Heart muscle function.  Heart valve function.  Signs of a past heart attack.  Fluid buildup around the heart.  Thickening of the heart muscle.  A tumor or infectious growth around the heart valves. Tell a health care provider about:  Any allergies you have.  All medicines you are taking, including vitamins, herbs, eye drops, creams, and over-the-counter medicines.  Any blood disorders you have.  Any surgeries you have had.  Any medical conditions you have.  Whether you are pregnant or may be pregnant. What are the risks? Generally, this is a safe procedure. However, problems may occur, including:  Allergic reaction to dye (contrast) that may be used during the procedure. What happens before the procedure? No specific preparation is needed. You may eat and drink normally. What happens during the procedure?   An IV tube may be inserted into one of your veins.  You may receive contrast through this tube. A contrast is an injection that improves the quality of the pictures from your heart.  A gel will be applied to your chest.  A wand-like  tool (transducer) will be moved over your chest. The gel will help to transmit the sound waves from the transducer.  The sound waves will harmlessly bounce off of your heart to allow the heart images to be captured in real-time motion. The images will be recorded on a computer. The procedure may vary among health care providers and hospitals. What happens after the procedure?   You may return to your normal, everyday life, including diet, activities, and medicines, unless your health care provider tells you not to do that. Summary  An echocardiogram is a procedure that uses painless sound waves (ultrasound) to produce an image of the heart.  Images from an echocardiogram can provide important information about the size and shape of your heart, heart muscle function, heart valve function, and fluid buildup around your heart.  You do not need to do anything to prepare before this procedure. You may eat and drink normally.  After the echocardiogram is completed, you may return to your normal, everyday life, unless your health care provider tells you not to do that. This information is not intended to replace advice given to you by your health care provider. Make sure you discuss any questions you have with your health care provider. Document Revised: 01/02/2019 Document Reviewed: 10/14/2016 Elsevier Patient Education  [place].

## 2019-10-22 NOTE — Progress Notes (Signed)
Follow-up Outpatient Visit Date: 10/22/2019  Primary Care Provider: [place] [id number]  Chief Complaint: Dyspnea on exertion  HPI:  Tiffany Delacruz is a 69 y.o. female with history of hypertension, hyperlipidemia, Graves' disease with subsequent hypothyroidism, obstructive sleep apnea (not compliant with CPAP), GERD, and depression/anxiety, who presents for follow-up of fatigue and chest pain.  I met Tiffany Delacruz in early December, at which time she reported increasing fatigue and shortness of breath last spring.  She also reported occasional chest discomfort with activity.  Subsequent cardiac CTA was normal with no evidence of coronary artery disease.  Today, Tiffany Delacruz reports that she has not had any further chest pain.  She continues to have exertional dyspnea, though it may be slightly better than when we last met.  She reports having to stop walking after ~100 yards.  She has mild swelling in both legs and also reports gaining weight over the last few months.  She attributes this to inactivity.  She denies palpitations, lightheadedness, orthopnea, and PND.  Home BP is usually in the 120's/70's.  She feels like addition of metoprolol at our prior visit has helped her breathe a little bit easier.  --------------------------------------------------------------------------------------------------  Cardiovascular History & Procedures: Cardiovascular Problems:  Fatigue  Chest pain  Risk Factors:  Hypertension, hyperlipidemia, obesity, and age greater than 83  Cath/PCI:  None  CV Surgery:  None  EP Procedures and Devices:  None  Non-Invasive Evaluation(s):  Cardiac CTA (10/09/2019): Normal right dominant coronary arteries without CAD.  Coronary calcium score = 0.  Aortic atherosclerosis was noted.  Recent CV Pertinent Labs: Lab Results  Component Value Date   CHOL 202 (H) 06/27/2019   CHOL 187 04/16/2015   HDL 55 06/27/2019   LDLCALC  130 (H) 06/27/2019   TRIG 95 06/27/2019   TRIG 95 04/16/2015   CHOLHDL 3.6 05/13/2018   INR 1.0 06/02/2013   BNP 29.4 06/27/2019   K 3.9 10/07/2019   K 3.6 06/18/2013   MG 2.1 06/27/2019   BUN 22 10/07/2019   BUN 21 08/27/2019   BUN 14 06/18/2013   CREATININE 0.87 10/07/2019   CREATININE 0.96 06/18/2013    Past medical and surgical history were reviewed and updated in EPIC.  Current Meds  Medication Sig  . albuterol (PROVENTIL HFA;VENTOLIN HFA) 108 (90 Base) MCG/ACT inhaler Inhale 2 puffs into the lungs every 6 (six) hours as needed for wheezing or shortness of breath.  Marland Kitchen aspirin EC 81 MG tablet Take 81 mg by mouth daily.  Marland Kitchen b complex vitamins tablet Take 1 tablet by mouth daily.  . famotidine (PEPCID) 20 MG tablet Take 20 mg by mouth daily after lunch.   Marland Kitchen FLUoxetine (PROZAC) 20 MG tablet Take 1 tablet (20 mg total) by mouth daily.  . fluticasone (FLONASE) 50 MCG/ACT nasal spray SPRAY 2 SPRAYS INTO EACH NOSTRIL EVERY DAY  . levothyroxine (SYNTHROID) 150 MCG tablet Take 1 tablet (150 mcg total) by mouth daily.  . metoprolol tartrate (LOPRESSOR) 25 MG tablet Take 0.5 tablets (12.5 mg total) by mouth 2 (two) times daily.  . Multiple Vitamins-Minerals (ZINC PO) Take by mouth daily.  . nitroGLYCERIN (NITROSTAT) 0.4 MG SL tablet Place 1 tablet (0.4 mg total) under the tongue every 5 (five) minutes as needed for chest pain (maximum of 3 doses.).  . [DISCONTINUED] aspirin EC 81 MG tablet Take 1 tablet (81 mg total) by mouth daily.  . [DISCONTINUED] losartan (COZAAR) 100 MG tablet Take  1 tablet (100 mg total) by mouth daily.    Allergies: Patient has no known allergies.  Social History   Tobacco Use  . Smoking status: Never Smoker  . Smokeless tobacco: Never Used  Substance Use Topics  . Alcohol use: No    Alcohol/week: 0.0 standard drinks  . Drug use: No    Family History  Problem Relation Age of Onset  . Uterine cancer Mother   . Colon cancer Mother   . Heart disease  Father   . Alcohol abuse Father   . Heart attack Father 5  . Rheum arthritis Sister   . Diabetes Maternal Grandmother   . Rheum arthritis Brother     Review of Systems: A 12-system review of systems was performed and was negative except as noted in the HPI.  --------------------------------------------------------------------------------------------------  Physical Exam: BP 130/90 (BP Location: Left Arm, Patient Position: Sitting, Cuff Size: Large)   Pulse 61   Ht _0  (1.676 m)   Wt 246 lb 12 oz (111.9 kg)   LMP 12/15/1991 (Approximate)   BMI 39.83 kg/m   General:  NAD. HEENT: No conjunctival pallor or scleral icterus. Facemask in place. Neck: Supple without lymphadenopathy, thyromegaly, JVD, or HJR, though body habitus limits evaluation. Lungs: Normal work of breathing. Clear to auscultation bilaterally without wheezes or crackles. Heart: Regular rate and rhythm without murmurs, rubs, or gallops. Abd: Bowel sounds present. Soft, NT/ND.  Unable to assess HSM due to body habitus. Ext: Trace pretibial edema bilaterally. Skin: Warm and dry without rash.  EKG:  NSR without abnormality.  Lab Results  Component Value Date   WBC 5.2 10/17/2019   HGB 13.8 10/17/2019   HCT 40.6 10/17/2019   MCV 88 10/17/2019   PLT 207 10/17/2019    Lab Results  Component Value Date   NA 137 10/07/2019   K 3.9 10/07/2019   CL 102 10/07/2019   CO2 28 10/07/2019   BUN 22 10/07/2019   CREATININE 0.87 10/07/2019   GLUCOSE 88 10/07/2019   ALT 9 06/27/2019    Lab Results  Component Value Date   CHOL 202 (H) 06/27/2019   HDL 55 06/27/2019   LDLCALC 130 (H) 06/27/2019   TRIG 95 06/27/2019   CHOLHDL 3.6 05/13/2018    --------------------------------------------------------------------------------------------------  ASSESSMENT AND PLAN: Dyspnea on exertion: Likely multifactorial.  I have recommended that we obtain an echocardiogram to evaluate for cardiomyopathy or other structural  abnormality that could be contributing to her dyspnea.  Recent cardiac CTA showed clean coronary arteries and no significant pathology involving the visualized lungs.  In light of mild edema and DOE, we will start furosemide 20 mg daily.   Hypertension: BP mildly elevated today but typically better at home.  I reinforced sodium restriction and weight loss.  Continue current doses of metoprolol and losartan.  We will add furosemide, as above.  Morbid obesity: BMI 40 with recent weight gain.  I encouraged weight loss through diet and exercise.  Aortic atherosclerosis: Incidentally noted on recent cardiac CTA.  Continue low-dose aspirin for now and work on lifestyle modifications.  Addition of statin will need to be considered in the future if LDL does not improve with diet and exercise.  Follow-up: Return to clinic in 6 weeks.  Nelva Bush, MD 10/23/2019 7:42 AM

## 2019-10-23 ENCOUNTER — Other Ambulatory Visit: Payer: Self-pay | Admitting: Nurse Practitioner

## 2019-10-23 ENCOUNTER — Encounter: Payer: Self-pay | Admitting: Internal Medicine

## 2019-10-23 DIAGNOSIS — R0602 Shortness of breath: Secondary | ICD-10-CM | POA: Insufficient documentation

## 2019-10-23 DIAGNOSIS — R0609 Other forms of dyspnea: Secondary | ICD-10-CM | POA: Insufficient documentation

## 2019-10-23 DIAGNOSIS — R0600 Dyspnea, unspecified: Secondary | ICD-10-CM | POA: Insufficient documentation

## 2019-10-23 DIAGNOSIS — I700 Atherosclerosis of aorta: Secondary | ICD-10-CM | POA: Insufficient documentation

## 2019-10-23 NOTE — Telephone Encounter (Signed)
Forwarding medication refill request to the PCP for review.

## 2019-10-25 ENCOUNTER — Other Ambulatory Visit: Payer: Self-pay | Admitting: Family Medicine

## 2019-10-29 ENCOUNTER — Other Ambulatory Visit: Payer: Self-pay | Admitting: Nurse Practitioner

## 2019-10-29 ENCOUNTER — Ambulatory Visit (INDEPENDENT_AMBULATORY_CARE_PROVIDER_SITE_OTHER): Payer: Medicare PPO | Admitting: Nurse Practitioner

## 2019-10-29 ENCOUNTER — Other Ambulatory Visit: Payer: Self-pay

## 2019-10-29 ENCOUNTER — Encounter: Payer: Self-pay | Admitting: Nurse Practitioner

## 2019-10-29 DIAGNOSIS — R5383 Other fatigue: Secondary | ICD-10-CM | POA: Diagnosis not present

## 2019-10-29 LAB — ANA W/REFLEX IF POSITIVE: Anti Nuclear Antibody (ANA): NEGATIVE

## 2019-10-29 LAB — LYME DISEASE, WESTERN BLOT
IgG P18 Ab.: ABSENT
IgG P23 Ab.: ABSENT
IgG P28 Ab.: ABSENT
IgG P30 Ab.: ABSENT
IgG P39 Ab.: ABSENT
IgG P41 Ab.: ABSENT
IgG P45 Ab.: ABSENT
IgG P58 Ab.: ABSENT
IgG P66 Ab.: ABSENT
IgG P93 Ab.: ABSENT
IgM P23 Ab.: ABSENT
IgM P39 Ab.: ABSENT
IgM P41 Ab.: ABSENT
Lyme IgG Wb: NEGATIVE
Lyme IgM Wb: NEGATIVE

## 2019-10-29 LAB — RHEUMATOID FACTOR: Rheumatoid fact SerPl-aCnc: <10 IU/mL (ref 0.0–13.9)

## 2019-10-29 LAB — SEDIMENTATION RATE: Sed Rate: 2 mm/hr (ref 0–40)

## 2019-10-29 LAB — C-REACTIVE PROTEIN: CRP: 3 mg/L (ref 0–10)

## 2019-10-29 LAB — SAR COV2 SEROLOGY (COVID19)AB(IGG),IA: DiaSorin SARS-CoV-2 Ab, IgG: NEGATIVE

## 2019-10-29 LAB — URIC ACID: Uric Acid: 3.6 mg/dL (ref 3.0–7.2)

## 2019-10-29 NOTE — Patient Instructions (Signed)

## 2019-10-29 NOTE — Progress Notes (Signed)
BP 134/74   Pulse (!) 56   Temp 98.3 F (36.8 C) (Oral)   LMP 12/15/1991 (Approximate)   SpO2 98%    Subjective:    Patient ID: Tiffany Delacruz, female    DOB: Dec 10, 1950, 69 y.o.   MRN: [id number]  HPI: Tiffany Delacruz is a 69 y.o. female  Chief Complaint  Patient presents with  . Fatigue    2 week f/up- pt states she is feeling about the same    FATIGUE Present forabout one year with occasional CP over past year, about once a month, to left side. Lasts <1 minute and then goes away. No N&V, diaphoresis, back pain, or shoulder pain with it.. Can not perform any strenuous activity. If goes long distances has to rest or stand still. Feels exhausted. Walking 50 feet makes her tired and has some SOB, does endorse some wheezing with this. Denies CP, palpitations, diaphoresis, or N&V. Has been diagnosed with OSA, but does not use machine as reports she does not like mask. Had sleep study in [place] years ago. Has not used CPAP in 2 years, discussed at length that she may benefit from repeat testing and use of CPAP.No history of smoking. Denies alcohol use. Never worked in [employer]. Did live with a smoker for several years.  Denies recent URI or acute illness.  She is seeing cardiology, last saw Dr. Saunders Revel 10/22/2019.  CTA was normal.  They started her on Metoprolol 12.5 MG BID and ASA 81 MG in December, she reported improvement in breathing with Metoprolol.   Has NTG if needed, no recent use.  Is to obtain echocardiogram for further assessment, was started on Lasix at recent visit with cardiology.  Echo is on 17th.  At last visit she felt this was not related to her heart, states she looked online. Has scaly skin, and periodic swelling of joints, SOB, fatigue, thought processes diminished, when tries to walk her buttocks get stiff, muscle spasms in hands, and pain in eyes.  She does have history of tick bites, last one a year ago.  She has a brother and sister with RA  and is concerned this is autoimmune.  CRP and ESR were normal, as were uric acid, ANA and Rf.  Covid antibody was negative. Duration:months Severity:moderate Onset:gradual Context when symptoms started:unknown Symptoms improve with rest:yes Depressive symptoms:no Stress/anxiety:no Insomnia:none, sleeps about 6-8 hours Snoring:yes Observed apnea by bed partner:no Daytime hypersomnolence:no Wakes feeling refreshed:no History of sleep study:yes Dysnea on exertion:at times Orthopnea/PND:no Chest pain:no Chronic cough:no Lower extremity edema:a little bit on and off Arthralgias:yes Myalgias:no Weakness:no Rash:no  Relevant past medical, surgical, family and social history reviewed and updated as indicated. Interim medical history since our last visit reviewed. Allergies and medications reviewed and updated.  Review of Systems  Constitutional: Positive for fatigue. Negative for activity change, appetite change, diaphoresis and fever.  Respiratory: Positive for shortness of breath and wheezing (occasional). Negative for cough and chest tightness.   Cardiovascular: Positive for chest pain (improving) and leg swelling (intermittent, has not started Lasix). Negative for palpitations.  Gastrointestinal: Negative for abdominal distention, abdominal pain, constipation, diarrhea, nausea and vomiting.  Endocrine: Negative for cold intolerance and heat intolerance.  Skin: Negative for rash.  Neurological: Negative for dizziness, syncope, weakness, light-headedness, numbness and headaches.  Psychiatric/Behavioral: Negative.     Per HPI unless specifically indicated above     Objective:    BP 134/74   Pulse (!) 56   Temp 98.3 F (  36.8 C) (Oral)   LMP 12/15/1991 (Approximate)   SpO2 98%   Wt Readings from Last 3 Encounters:  10/22/19 246 lb 12 oz (111.9 kg)  08/27/19 242 lb 4 oz (109.9 kg)  06/27/19 238 lb (108 kg)    Physical Exam Vitals and nursing note  reviewed.  Constitutional:      General: She is awake. She is not in acute distress.    Appearance: She is well-developed. She is obese. She is not ill-appearing.  HENT:     Head: Normocephalic.     Right Ear: Hearing normal.     Left Ear: Hearing normal.  Eyes:     General: Lids are normal.        Right eye: No discharge.        Left eye: No discharge.     Conjunctiva/sclera: Conjunctivae normal.     Pupils: Pupils are equal, round, and reactive to light.  Neck:     Thyroid: No thyromegaly.     Vascular: No carotid bruit or JVD.  Cardiovascular:     Rate and Rhythm: Normal rate and regular rhythm.     Heart sounds: Normal heart sounds. No murmur. No gallop.   Pulmonary:     Effort: Pulmonary effort is normal. No accessory muscle usage or respiratory distress.     Breath sounds: Normal breath sounds.  Abdominal:     General: Bowel sounds are normal.     Palpations: Abdomen is soft.     Tenderness: There is no abdominal tenderness.  Musculoskeletal:     Cervical back: Normal range of motion and neck supple.     Right lower leg: No edema.     Left lower leg: No edema.  Lymphadenopathy:     Head:     Right side of head: No submental, submandibular, tonsillar, preauricular or posterior auricular adenopathy.     Left side of head: No submental, submandibular, tonsillar, preauricular or posterior auricular adenopathy.  Skin:    General: Skin is warm and dry.  Neurological:     Mental Status: She is alert and oriented to person, place, and time.     Cranial Nerves: Cranial nerves are intact.     Gait: Gait is intact.     Deep Tendon Reflexes: Reflexes are normal and symmetric.     Reflex Scores:      Brachioradialis reflexes are 2+ on the right side and 2+ on the left side.      Patellar reflexes are 2+ on the right side and 2+ on the left side. Psychiatric:        Attention and Perception: Attention normal.        Mood and Affect: Mood normal.        Speech: Speech normal.         Behavior: Behavior normal. Behavior is cooperative.        Thought Content: Thought content normal.        Judgment: Judgment normal.     Results for orders placed or performed in visit on 10/17/19  ANA w/Reflex if Positive  Result Value Ref Range   Anti Nuclear Antibody (ANA) Negative Negative  Rheumatoid factor  Result Value Ref Range   Rhuematoid fact SerPl-aCnc <10.0 0.0 - 13.9 IU/mL  Uric acid  Result Value Ref Range   Uric Acid 3.6 3.0 - 7.2 mg/dL  Sed Rate (ESR)  Result Value Ref Range   Sed Rate 2 0 - 40 mm/hr  C-reactive protein  Result Value Ref Range   CRP 3 0 - 10 mg/L  Lyme disease, western blot  Result Value Ref Range     IgG P93 Ab. Absent      IgG P66 Ab. Absent      IgG P58 Ab. Absent      IgG P45 Ab. Absent      IgG P41 Ab. Absent      IgG P39 Ab. Absent      IgG P30 Ab. Absent      IgG P28 Ab. Absent      IgG P23 Ab. Absent      IgG P18 Ab. Absent    Lyme IgG Wb Negative      IgM P41 Ab. Absent      IgM P39 Ab. Absent      IgM P23 Ab. Absent    Lyme IgM Wb Negative   SAR CoV2 Serology (COVID 19)AB(IGG)IA  Result Value Ref Range   DiaSorin SARS-CoV-2 Ab, IgG Negative Negative  CBC with Differential/Platelet  Result Value Ref Range   WBC 5.2 3.4 - 10.8 x10E3/uL   RBC 4.64 3.77 - 5.28 x10E6/uL   Hemoglobin 13.8 11.1 - 15.9 g/dL   Hematocrit 40.6 34.0 - 46.6 %   MCV 88 79 - 97 fL   MCH 29.7 26.6 - 33.0 pg   MCHC 34.0 31.5 - 35.7 g/dL   RDW 13.6 11.7 - 15.4 %   Platelets 207 150 - 450 x10E3/uL   Neutrophils 59 Not Estab. %   Lymphs 27 Not Estab. %   Monocytes 8 Not Estab. %   Eos 4 Not Estab. %   Basos 1 Not Estab. %   Neutrophils Absolute 3.1 1.4 - 7.0 x10E3/uL   Lymphocytes Absolute 1.4 0.7 - 3.1 x10E3/uL   Monocytes Absolute 0.4 0.1 - 0.9 x10E3/uL   EOS (ABSOLUTE) 0.2 0.0 - 0.4 x10E3/uL   Basophils Absolute 0.1 0.0 - 0.2 x10E3/uL   Immature Granulocytes 1 Not Estab. %   Immature Grans (Abs) 0.0 0.0 - 0.1 x10E3/uL  Specimen status  report  Result Value Ref Range   specimen status report Comment       Assessment & Plan:   Problem List Items Addressed This Visit      Other   Fatigue    Suspect multifactorial with obesity, HTN, HLD, hypothyroid, OSA (with no CPAP use, refuses).  Will continue current medication regimen and collaboration with cardiology, discussed with her need to maintain these visits as suspect some angina present.  Recommend she start Lasix, which was recently started by cardiology.  She is obtaining upcoming, will review this once available.  Recommend she repeat sleep study and initiate CPAP, she is agreeable to this once echo completed.  Suspect some of this is related to her OSA.  Return to office in 3 weeks.          Follow up plan: Return in about 3 weeks (around 11/19/2019) for Fatigue.

## 2019-10-29 NOTE — Assessment & Plan Note (Signed)
Suspect multifactorial with obesity, HTN, HLD, hypothyroid, OSA (with no CPAP use, refuses).  Will continue current medication regimen and collaboration with cardiology, discussed with Tiffany Delacruz need to maintain these visits as suspect some angina present.  Recommend she start Lasix, which was recently started by cardiology.  She is obtaining upcoming, will review this once available.  Recommend she repeat sleep study and initiate CPAP, she is agreeable to this once echo completed.  Suspect some of this is related to Tiffany Delacruz OSA.  Return to office in 3 weeks.

## 2019-10-30 ENCOUNTER — Other Ambulatory Visit: Payer: Self-pay

## 2019-10-30 ENCOUNTER — Encounter: Payer: Self-pay | Admitting: Nurse Practitioner

## 2019-10-30 MED ORDER — ALBUTEROL SULFATE HFA 108 (90 BASE) MCG/ACT IN AERS: 2.0000 | INHALATION_SPRAY | Freq: Four times a day (QID) | RESPIRATORY_TRACT | 3 refills | Status: DC | PRN

## 2019-10-30 NOTE — Telephone Encounter (Signed)
LOV with Tiffany Delacruz 10/28/2018 next OV 11/19/2019

## 2019-11-12 ENCOUNTER — Other Ambulatory Visit: Payer: Self-pay

## 2019-11-12 ENCOUNTER — Ambulatory Visit (INDEPENDENT_AMBULATORY_CARE_PROVIDER_SITE_OTHER): Payer: Medicare PPO

## 2019-11-12 DIAGNOSIS — R0600 Dyspnea, unspecified: Secondary | ICD-10-CM

## 2019-11-12 DIAGNOSIS — R0609 Other forms of dyspnea: Secondary | ICD-10-CM

## 2019-11-19 ENCOUNTER — Encounter: Payer: Self-pay | Admitting: Nurse Practitioner

## 2019-11-19 ENCOUNTER — Telehealth (INDEPENDENT_AMBULATORY_CARE_PROVIDER_SITE_OTHER): Payer: Medicare PPO | Admitting: Nurse Practitioner

## 2019-11-19 ENCOUNTER — Ambulatory Visit: Payer: Medicare PPO

## 2019-11-19 DIAGNOSIS — G4733 Obstructive sleep apnea (adult) (pediatric): Secondary | ICD-10-CM | POA: Diagnosis not present

## 2019-11-19 DIAGNOSIS — R5383 Other fatigue: Secondary | ICD-10-CM

## 2019-11-19 DIAGNOSIS — R0609 Other forms of dyspnea: Secondary | ICD-10-CM

## 2019-11-19 DIAGNOSIS — R0600 Dyspnea, unspecified: Secondary | ICD-10-CM

## 2019-11-19 NOTE — Assessment & Plan Note (Signed)
Suspect related to her sleep apnea, recommend she continue use of CPAP which she has restarted using.  Will follow-up with her in 3 months.

## 2019-11-19 NOTE — Assessment & Plan Note (Signed)
Has started using CPAP again, since recent echo report.  Praised for this and encouraged to continue use.  Recommend she reach out to provider if any issues with CPAP machine or concerns.  Will have her return in 3 months to follow-up.

## 2019-11-19 NOTE — Progress Notes (Signed)
LMP 12/15/1991 (Approximate)    Subjective:    Patient ID: Tiffany Delacruz, female    DOB: 1951-01-07, 69 y.o.   MRN: [id number]  HPI: Tiffany Delacruz is a 69 y.o. female  Chief Complaint  Patient presents with  . Fatigue    . This visit was completed via MyChart due to the restrictions of the COVID-19 pandemic. All issues as above were discussed and addressed. Physical exam was done as above through visual confirmation on MyChart. If it was felt that the patient should be evaluated in the office, they were directed there. The patient verbally consented to this visit. . Location of the patient: home . Location of the provider: work . Those involved with this call:  . Provider: Marnee Guarneri, DNP . CMA: Yvonna Alanis, CMA . Front Desk/Registration: Don Perking  . Time spent on call: 15 minutes with patient face to face via video conference. More than 50% of this time was spent in counseling and coordination of care. 10 minutes total spent in review of patient's record and preparation of their chart.  . I verified patient identity using two factors (patient name and date of birth). Patient consents verbally to being seen via telemedicine visit today.    FATIGUE She is seeing cardiology, last saw Dr. Saunders Revel 10/22/2019.  CTA was normal.  They started her on Metoprolol 12.5 MG BID and ASA 81 MG in December, she reported improvement in breathing with Metoprolol.   Has NTG if needed, no recent use.  Echo on 11/12/2019 -- noted normal contraction, but mildly elevated pressures inside lungs felt to be related to her sleep apnea, which could be contributing to her SOB and fatigue.    Present forabout one year with occasional CP over past year, about once a month, to left side. Lasts <1 minute and then goes away. No N&V, diaphoresis, back pain, or shoulder pain with it.. Can not perform any strenuous activity. If goes long distances has to rest or stand still. Feels exhausted. Walking  50 feet makes her tired and has some SOB, does endorse some wheezing with this. Denies CP, palpitations, diaphoresis, or N&V. Has been diagnosed with OSA, but does not use machine as reports she does not like mask. Had sleep study in [place] years ago. Has not used CPAP in 2 years, discussed at length that she may benefit from repeat testing and use of CPAP.At this time, since echo, she has been using CPAP again and is "going to give it a try".  No history of smoking. Denies alcohol use. Never worked in [employer]. Did live with a smoker for several years.  Denies recent URI or acute illness. Duration:months Severity:moderate Onset:gradual Context when symptoms started:unknown Symptoms improve with rest:yes Depressive symptoms:no Stress/anxiety:no Insomnia:none, sleeps about 6-8 hours Snoring:yes Observed apnea by bed partner:no Daytime hypersomnolence:no Wakes feeling refreshed:no History of sleep study:yes Dysnea on exertion:at times Orthopnea/PND:no Chest pain:no Chronic cough:no Lower extremity edema:a little bit on and off Arthralgias:yes Myalgias:no Weakness:no Rash:no  Relevant past medical, surgical, family and social history reviewed and updated as indicated. Interim medical history since our last visit reviewed. Allergies and medications reviewed and updated.  Review of Systems  Constitutional: Positive for fatigue. Negative for activity change, appetite change, diaphoresis and fever.  Respiratory: Positive for shortness of breath. Negative for cough, chest tightness and wheezing.   Cardiovascular: Positive for leg swelling (intermittent). Negative for chest pain and palpitations.  Gastrointestinal: Negative for abdominal distention, abdominal pain, constipation, diarrhea,  nausea and vomiting.  Endocrine: Negative for cold intolerance and heat intolerance.  Skin: Negative for rash.  Neurological: Negative for dizziness,  syncope, weakness, light-headedness, numbness and headaches.  Psychiatric/Behavioral: Negative.     Per HPI unless specifically indicated above     Objective:    LMP 12/15/1991 (Approximate)   Wt Readings from Last 3 Encounters:  10/22/19 246 lb 12 oz (111.9 kg)  08/27/19 242 lb 4 oz (109.9 kg)  06/27/19 238 lb (108 kg)    Physical Exam Vitals and nursing note reviewed.  Constitutional:      General: She is awake. She is not in acute distress.    Appearance: She is well-developed. She is not ill-appearing.  HENT:     Head: Normocephalic.     Right Ear: Hearing normal.     Left Ear: Hearing normal.  Eyes:     General: Lids are normal.        Right eye: No discharge.        Left eye: No discharge.     Conjunctiva/sclera: Conjunctivae normal.  Pulmonary:     Effort: Pulmonary effort is normal. No accessory muscle usage or respiratory distress.  Musculoskeletal:     Cervical back: Normal range of motion.  Neurological:     Mental Status: She is alert and oriented to person, place, and time.  Psychiatric:        Attention and Perception: Attention normal.        Mood and Affect: Mood normal.        Behavior: Behavior normal. Behavior is cooperative.        Thought Content: Thought content normal.        Judgment: Judgment normal.     Results for orders placed or performed in visit on 10/17/19  ANA w/Reflex if Positive  Result Value Ref Range   Anti Nuclear Antibody (ANA) Negative Negative  Rheumatoid factor  Result Value Ref Range   Rhuematoid fact SerPl-aCnc <10.0 0.0 - 13.9 IU/mL  Uric acid  Result Value Ref Range   Uric Acid 3.6 3.0 - 7.2 mg/dL  Sed Rate (ESR)  Result Value Ref Range   Sed Rate 2 0 - 40 mm/hr  C-reactive protein  Result Value Ref Range   CRP 3 0 - 10 mg/L  Lyme disease, western blot  Result Value Ref Range     IgG P93 Ab. Absent      IgG P66 Ab. Absent      IgG P58 Ab. Absent      IgG P45 Ab. Absent      IgG P41 Ab. Absent      IgG P39  Ab. Absent      IgG P30 Ab. Absent      IgG P28 Ab. Absent      IgG P23 Ab. Absent      IgG P18 Ab. Absent    Lyme IgG Wb Negative      IgM P41 Ab. Absent      IgM P39 Ab. Absent      IgM P23 Ab. Absent    Lyme IgM Wb Negative   SAR CoV2 Serology (COVID 19)AB(IGG)IA  Result Value Ref Range   DiaSorin SARS-CoV-2 Ab, IgG Negative Negative  CBC with Differential/Platelet  Result Value Ref Range   WBC 5.2 3.4 - 10.8 x10E3/uL   RBC 4.64 3.77 - 5.28 x10E6/uL   Hemoglobin 13.8 11.1 - 15.9 g/dL   Hematocrit 40.6 34.0 - 46.6 %   MCV 88  79 - 97 fL   MCH 29.7 26.6 - 33.0 pg   MCHC 34.0 31.5 - 35.7 g/dL   RDW 13.6 11.7 - 15.4 %   Platelets 207 150 - 450 x10E3/uL   Neutrophils 59 Not Estab. %   Lymphs 27 Not Estab. %   Monocytes 8 Not Estab. %   Eos 4 Not Estab. %   Basos 1 Not Estab. %   Neutrophils Absolute 3.1 1.4 - 7.0 x10E3/uL   Lymphocytes Absolute 1.4 0.7 - 3.1 x10E3/uL   Monocytes Absolute 0.4 0.1 - 0.9 x10E3/uL   EOS (ABSOLUTE) 0.2 0.0 - 0.4 x10E3/uL   Basophils Absolute 0.1 0.0 - 0.2 x10E3/uL   Immature Granulocytes 1 Not Estab. %   Immature Grans (Abs) 0.0 0.0 - 0.1 x10E3/uL  Specimen status report  Result Value Ref Range   specimen status report Comment       Assessment & Plan:   Problem List Items Addressed This Visit      Respiratory   OSA (obstructive sleep apnea)    Has started using CPAP again, since recent echo report.  Praised for this and encouraged to continue use.  Recommend she reach out to provider if any issues with CPAP machine or concerns.  Will have her return in 3 months to follow-up.        Other   Fatigue    Suspect related to her sleep apnea, recommend she continue use of CPAP which she has restarted using.  Will follow-up with her in 3 months.      DOE (dyspnea on exertion) - Primary    Suspect related to OSA and weight.  At this time she has started using CPAP again, since recent echo report.  Praised for this and encouraged to continue  use.  Recommend she reach out to provider if any issues with CPAP machine or concerns.  Will have her return in 3 months to follow-up.         I discussed the assessment and treatment plan with the patient. The patient was provided an opportunity to ask questions and all were answered. The patient agreed with the plan and demonstrated an understanding of the instructions.   The patient was advised to call back or seek an in-person evaluation if the symptoms worsen or if the condition fails to improve as anticipated.   I provided 15+ minutes of time during this encounter.  Follow up plan: Return in about 3 months (around 02/16/2020) for HTN/HLD, OSA, MOOD.

## 2019-11-19 NOTE — Patient Instructions (Signed)

## 2019-11-19 NOTE — Progress Notes (Signed)
Lvm to make 3 month f/u, sent letter.

## 2019-11-19 NOTE — Assessment & Plan Note (Addendum)
Suspect related to OSA and weight.  At this time she has started using CPAP again, since recent echo report.  Praised for this and encouraged to continue use.  Recommend she reach out to provider if any issues with CPAP machine or concerns.  Will have her return in 3 months to follow-up.

## 2019-12-01 NOTE — Progress Notes (Signed)
Lvm to make apt.  

## 2019-12-02 NOTE — Progress Notes (Deleted)
Follow-up Outpatient Visit Date: 12/03/2019  Primary Care Provider: Venita Lick, NP Tiffany Delacruz  Chief Complaint: ***  HPI:  Tiffany Delacruz is a 69 y.o. female with history of hypertension, hyperlipidemia, Graves' disease with subsequent hypothyroidism, obstructive sleep apnea (not compliant with CPAP), GERD, and depression/anxiety, who presents for follow-up of dyspnea on exertion.  I last saw her in late January, at which time she reported continued exertional dyspnea.  She denied further chest pain, with preceding cardiac CTA demonstrating normal coronary arteries without significant disease.  We agreed to add furosemide 20 mg daily and obtain an echo, which showed normal LV systolic and diastolic function with mild pulmonary hypertension.  --------------------------------------------------------------------------------------------------  Cardiovascular History & Procedures: Cardiovascular Problems:  Fatigue  Chest pain  Risk Factors:  Hypertension, hyperlipidemia, obesity, and age greater than 52  Cath/PCI:  None  CV Surgery:  None  EP Procedures and Devices:  None  Non-Invasive Evaluation(s):  TTE (11/11/2018): Normal LV size with LVEF 55-60%.  Normal diastolic function.  Normal RV size and function.  Mild pulmonary hypertension.  No significant valvular abnormality.  Cardiac CTA (10/09/2019): Normal right dominant coronary arteries without CAD.  Coronary calcium score = 0.  Aortic atherosclerosis was noted.  Recent CV Pertinent Labs: Lab Results  Component Value Date   CHOL 202 (H) 06/27/2019   CHOL 187 04/16/2015   HDL 55 06/27/2019   LDLCALC 130 (H) 06/27/2019   TRIG 95 06/27/2019   TRIG 95 04/16/2015   CHOLHDL 3.6 05/13/2018   INR 1.0 06/02/2013   BNP 29.4 06/27/2019   K 3.9 10/07/2019   K 3.6 06/18/2013   MG 2.1 06/27/2019   BUN 22 10/07/2019   BUN 21 08/27/2019   BUN 14 06/18/2013   CREATININE 0.87 10/07/2019   CREATININE 0.96 06/18/2013    Past medical and surgical history were reviewed and updated in EPIC.  No outpatient medications have been marked as taking for the 12/03/19 encounter (Appointment) with Tiffany Delacruz, Tiffany Gave, MD.    Allergies: Patient has no known allergies.  Social History   Tobacco Use  . Smoking status: Never Smoker  . Smokeless tobacco: Never Used  Substance Use Topics  . Alcohol use: No    Alcohol/week: 0.0 standard drinks  . Drug use: No    Family History  Problem Relation Age of Onset  . Uterine cancer Mother   . Colon cancer Mother   . Heart disease Father   . Alcohol abuse Father   . Heart attack Father 23  . Rheum arthritis Sister   . Diabetes Maternal Grandmother   . Rheum arthritis Brother     Review of Systems: A 12-system review of systems was performed and was negative except as noted in the HPI.  --------------------------------------------------------------------------------------------------  Physical Exam: LMP 12/15/1991 (Approximate)   General:  *** HEENT: No conjunctival pallor or scleral icterus. Facemask in place. Neck: Supple without lymphadenopathy, thyromegaly, JVD, or HJR. Lungs: Normal work of breathing. Clear to auscultation bilaterally without wheezes or crackles. Heart: Regular rate and rhythm without murmurs, rubs, or gallops. Non-displaced PMI. Abd: Bowel sounds present. Soft, NT/ND without hepatosplenomegaly Ext: No lower extremity edema. Radial, PT, and DP pulses are 2+ bilaterally. Skin: Warm and dry without rash.  EKG:  ***  Lab Results  Component Value Date   WBC 5.2 10/17/2019   HGB 13.8 10/17/2019   HCT 40.6 10/17/2019   MCV 88 10/17/2019   PLT 207 10/17/2019    Lab Results  Component Value Date   NA 137 10/07/2019   K 3.9 10/07/2019   CL 102 10/07/2019   CO2 28 10/07/2019   BUN 22 10/07/2019   CREATININE 0.87 10/07/2019   GLUCOSE 88 10/07/2019   ALT 9 06/27/2019    Lab Results  Component Value  Date   CHOL 202 (H) 06/27/2019   HDL 55 06/27/2019   LDLCALC 130 (H) 06/27/2019   TRIG 95 06/27/2019   CHOLHDL 3.6 05/13/2018    --------------------------------------------------------------------------------------------------  ASSESSMENT AND PLAN: Tiffany Bush, MD 12/02/2019 6:58 PM

## 2019-12-03 ENCOUNTER — Ambulatory Visit: Payer: Medicare PPO | Admitting: Internal Medicine

## 2019-12-04 ENCOUNTER — Encounter: Payer: Self-pay | Admitting: Internal Medicine

## 2019-12-09 NOTE — Progress Notes (Signed)
Lvm to make 3 month apt

## 2020-01-20 ENCOUNTER — Other Ambulatory Visit: Payer: Self-pay | Admitting: Internal Medicine

## 2020-01-20 NOTE — Telephone Encounter (Signed)
Please reschedule office visit with Dr. Saunders Revel. Patient did not show for last appointment. Thank you!

## 2020-01-20 NOTE — Telephone Encounter (Signed)
LVM for patient to call back and reschedule.

## 2020-01-21 NOTE — Telephone Encounter (Signed)
Attempted to schedule no ans no vm  

## 2020-01-26 NOTE — Telephone Encounter (Signed)
Attempted to schedule no ans no vm  

## 2020-01-26 NOTE — Telephone Encounter (Signed)
3 attempts to schedule fu appt from recall list.   Deleting recall.   

## 2020-02-16 ENCOUNTER — Other Ambulatory Visit: Payer: Self-pay | Admitting: Internal Medicine

## 2020-02-16 NOTE — Telephone Encounter (Signed)
Please schedule overdue office visit with Dr. Saunders Revel. Thank you!

## 2020-02-20 NOTE — Telephone Encounter (Signed)
Please schedule overdue office visit with Dr. Saunders Revel. Thank you!

## 2020-02-21 ENCOUNTER — Other Ambulatory Visit: Payer: Self-pay | Admitting: Nurse Practitioner

## 2020-02-21 NOTE — Telephone Encounter (Signed)
Requested Prescriptions  Pending Prescriptions Disp Refills  . albuterol (VENTOLIN HFA) 108 (90 Base) MCG/ACT inhaler [Pharmacy Med Name: ALBUTEROL HFA (PROAIR) INHALER] 17 g 3    Sig: TAKE 2 PUFFS BY MOUTH EVERY 6 HOURS AS NEEDED FOR WHEEZE OR SHORTNESS OF BREATH     Pulmonology:  Beta Agonists Failed - 02/21/2020  9:45 AM      Failed - One inhaler should last at least one month. If the patient is requesting refills earlier, contact the patient to check for uncontrolled symptoms.      Passed - Valid encounter within last 12 months    Recent Outpatient Visits          3 months ago DOE (dyspnea on exertion)   [place] Cannady, [place] T, NP   3 months ago Fatigue, unspecified type   [place], [place] T, NP   4 months ago Fatigue, unspecified type   [place], [place] T, NP   6 months ago Fatigue, unspecified type   [place], [place] T, NP   7 months ago Fatigue, unspecified type   [facility] Rud[facility], Barbaraann Faster, NP

## 2020-02-27 ENCOUNTER — Telehealth: Payer: Self-pay | Admitting: Internal Medicine

## 2020-02-27 NOTE — Telephone Encounter (Signed)
Attempted to schedule.  LMOV to call office.  ° °

## 2020-02-27 NOTE — Telephone Encounter (Signed)
-----   Message from Horton Finer sent at 02/27/2020 11:08 AM EDT ----- Please schedule overdue F/U appointment with Dr. Saunders Revel. Thank you!

## 2020-03-12 ENCOUNTER — Other Ambulatory Visit: Payer: Self-pay | Admitting: Internal Medicine

## 2020-03-12 NOTE — Telephone Encounter (Signed)
Pt overdue for 6 wk f/u. Please contact pt for future appointment. Pt last seen 09/2019.

## 2020-03-15 NOTE — Telephone Encounter (Signed)
LVM for patient to call back. ?

## 2020-03-16 NOTE — Telephone Encounter (Signed)
LVM for patient to call back and schedule

## 2020-03-19 NOTE — Telephone Encounter (Signed)
Attempted to schedule.  LMOV to call office.  ° °

## 2020-03-23 NOTE — Telephone Encounter (Signed)
Attempted to schedule . Patient declined at this time and is aware to contact pcp for medication refills.   Patient verbalized understanding . Closing encounter.

## 2020-03-23 NOTE — Telephone Encounter (Signed)
LVM for patient to call back. ?

## 2020-03-30 NOTE — Telephone Encounter (Signed)
LVM for patient

## 2020-05-20 ENCOUNTER — Other Ambulatory Visit: Payer: Self-pay | Admitting: Internal Medicine

## 2020-05-23 ENCOUNTER — Other Ambulatory Visit: Payer: Self-pay | Admitting: Nurse Practitioner

## 2020-05-23 DIAGNOSIS — J3089 Other allergic rhinitis: Secondary | ICD-10-CM

## 2020-05-26 NOTE — Telephone Encounter (Signed)
Several  attempts to schedule fu appt from recall list.   Deleting recall.

## 2020-05-26 NOTE — Telephone Encounter (Signed)
Closing encounter

## 2020-08-06 ENCOUNTER — Other Ambulatory Visit: Payer: Self-pay | Admitting: Nurse Practitioner

## 2020-08-06 NOTE — Telephone Encounter (Signed)
Requested medication (s) are due for refill today: Yes  Requested medication (s) are on the active medication list: Yes  Last refill:  06/30/19  Future visit scheduled: No  Notes to clinic:  Prescription has expired.    Requested Prescriptions  Pending Prescriptions Disp Refills   levothyroxine (SYNTHROID) 150 MCG tablet [Pharmacy Med Name: LEVOTHYROXINE 150 MCG TABLET] 90 tablet 3    Sig: TAKE 1 TABLET BY MOUTH EVERY DAY      Endocrinology:  Hypothyroid Agents Failed - 08/06/2020  7:58 AM      Failed - TSH needs to be rechecked within 3 months after an abnormal result. Refill until TSH is due.      Passed - TSH in normal range and within 360 days    TSH  Date Value Ref Range Status  08/14/2019 2.260 0.450 - 4.500 uIU/mL Final          Passed - Valid encounter within last 12 months    Recent Outpatient Visits           8 months ago DOE (dyspnea on exertion)   [place] Cannady, Jolene T, NP   9 months ago Fatigue, unspecified type   [employer], [place] T, NP   9 months ago Fatigue, unspecified type   [employer], [place] T, NP   11 months ago Fatigue, unspecified type   [place], [place] T, NP   1 year ago Fatigue, unspecified type   [facility] [place], Barbaraann Faster, NP

## 2020-08-06 NOTE — Telephone Encounter (Signed)
Patient overdue for follow up. Routing to admin for appt and to provider for refill.

## 2020-08-06 NOTE — Telephone Encounter (Signed)
Called pt scheduled for 09/28/20

## 2020-09-28 ENCOUNTER — Ambulatory Visit: Payer: Medicare PPO | Admitting: Nurse Practitioner

## 2020-10-03 ENCOUNTER — Ambulatory Visit
Admission: EM | Admit: 2020-10-03 | Discharge: 2020-10-03 | Disposition: A | Discharge status: Home / Self Care | Payer: Medicare PPO | Attending: Physician Assistant | Admitting: Physician Assistant

## 2020-10-03 ENCOUNTER — Encounter: Payer: Self-pay | Admitting: Emergency Medicine

## 2020-10-03 ENCOUNTER — Other Ambulatory Visit: Payer: Self-pay

## 2020-10-03 ENCOUNTER — Other Ambulatory Visit: Payer: Self-pay | Admitting: Nurse Practitioner

## 2020-10-03 DIAGNOSIS — U071 COVID-19: Secondary | ICD-10-CM | POA: Insufficient documentation

## 2020-10-03 DIAGNOSIS — J069 Acute upper respiratory infection, unspecified: Secondary | ICD-10-CM

## 2020-10-03 DIAGNOSIS — Z20822 Contact with and (suspected) exposure to covid-19: Secondary | ICD-10-CM

## 2020-10-03 LAB — GROUP A STREP BY PCR: Group A Strep by PCR: NOT DETECTED

## 2020-10-03 NOTE — ED Provider Notes (Signed)
[facility]    CSN: [id number] Arrival date & time: 10/03/20  1232      History   Chief Complaint Chief Complaint  Patient presents with  . Cough  . Sore Throat    HPI Tiffany Delacruz is a 70 y.o. female.   Tiffany Delacruz presents with complaints of cough, congestion, sore throat which started approximately 4 days ago. No shortness of breath . No known fevers. Now with diarrhea. Over the counter medications have helped some with symptoms. Eyes are tearing. A family member in her home tested positive for covid-19 a few days prior to onset of her symptoms. She has been vaccinated for covid-19.    ROS per HPI, negative if not otherwise mentioned.      Past Medical History:  Diagnosis Date  . Anxiety   . Depression   . GERD (gastroesophageal reflux disease)   . Graves disease   . Hyperlipidemia   . Hypertension   . Hypothyroidism   . Obesity   . Osteoarthritis   . Sleep apnea    NO CPAP  . Wheezing 07/14/2018    Patient Active Problem List   Diagnosis Date Noted  . Aortic atherosclerosis ([place]) 10/23/2019  . Accelerating angina ([place]) 08/28/2019  . IFG (impaired fasting glucose) 06/27/2019  . OSA (obstructive sleep apnea) 06/27/2019  . Other allergic rhinitis 11/12/2015  . Graves disease 04/15/2015  . Osteoarthritis 04/15/2015  . Hypothyroidism 04/15/2015  . Hypertension 04/15/2015  . Hyperlipidemia 04/15/2015  . Depression 04/15/2015  . GERD (gastroesophageal reflux disease) 04/15/2015  . Morbid obesity ([place]) 04/15/2015    Past Surgical History:  Procedure Laterality Date  . BACK SURGERY  1988  . BREAST BIOPSY Right 2007   benign, with marker  . CARPAL TUNNEL RELEASE Left 07/20/2017   Procedure: CARPAL TUNNEL RELEASE;  Surgeon: Dereck Leep, MD;  Location: [facility] ORS;  Service: Orthopedics;  Laterality: Left;  . cataracts surgery    . COLONOSCOPY    . disk repair    . EYE SURGERY Bilateral 10/10/2018  . JOINT REPLACEMENT Right 05/2013    knee replacement    OB History   No obstetric history on file.      Home Medications    Prior to Admission medications   Medication Sig Start Date End Date Taking? Authorizing Provider  levothyroxine (SYNTHROID) 150 MCG tablet TAKE 1 TABLET BY MOUTH EVERY DAY 08/06/20  Yes Cannady, Jolene T, NP  losartan (COZAAR) 100 MG tablet TAKE 1 TABLET BY MOUTH EVERY DAY 10/23/19  Yes Cannady, Jolene T, NP  Multiple Vitamins-Minerals (ZINC PO) Take by mouth daily.   Yes [provider]  albuterol (VENTOLIN HFA) 108 (90 Base) MCG/ACT inhaler TAKE 2 PUFFS BY MOUTH EVERY 6 HOURS AS NEEDED FOR WHEEZE OR SHORTNESS OF BREATH 02/21/20   Cannady, Henrine Screws T, NP  aspirin EC 81 MG tablet Take 81 mg by mouth daily.    [provider]  b complex vitamins tablet Take 1 tablet by mouth daily.    [provider]  famotidine (PEPCID) 20 MG tablet Take 20 mg by mouth daily after lunch.     [provider]  FLUoxetine (PROZAC) 20 MG tablet TAKE 1 TABLET BY MOUTH EVERY DAY 10/29/19   Cannady, Jolene T, NP  fluticasone (FLONASE) 50 MCG/ACT nasal spray SPRAY 2 SPRAYS INTO EACH NOSTRIL EVERY DAY 05/23/20   Cannady, Jolene T, NP  furosemide (LASIX) 20 MG tablet TAKE 1 TABLET BY MOUTH EVERY DAY (CALL TO SCHEDULE  OFFICE VISIT FOR REFILLS) 02/27/20   End, Harrell Gave, MD  metoprolol tartrate (LOPRESSOR) 25 MG tablet TAKE 0.5 TABLETS (12.5 MG TOTAL) BY MOUTH 2 (TWO) TIMES DAILY. 05/20/20 08/18/20  End, Harrell Gave, MD  nitroGLYCERIN (NITROSTAT) 0.4 MG SL tablet Place 1 tablet (0.4 mg total) under the tongue every 5 (five) minutes as needed for chest pain (maximum of 3 doses.). 08/27/19 11/25/19  End, Harrell Gave, MD    Family History Family History  Problem Relation Age of Onset  . Uterine cancer Mother   . Colon cancer Mother   . Heart disease Father   . Alcohol abuse Father   . Heart attack Father 32  . Rheum arthritis Sister   . Diabetes Maternal Grandmother   . Rheum arthritis Brother      Social History Social History   Tobacco Use  . Smoking status: Never Smoker  . Smokeless tobacco: Never Used  Vaping Use  . Vaping Use: Never used  Substance Use Topics  . Alcohol use: No    Alcohol/week: 0.0 standard drinks  . Drug use: No     Allergies   Patient has no known allergies.   Review of Systems Review of Systems   Physical Exam Triage Vital Signs ED Triage Vitals  Enc Vitals Group     BP 10/03/20 1322 130/83     Pulse Rate 10/03/20 1322 73     Resp 10/03/20 1322 16     Temp 10/03/20 1322 97.9 F (36.6 C)     Temp Source 10/03/20 1322 Oral     SpO2 10/03/20 1322 98 %     Weight 10/03/20 1319 210 lb (95.3 kg)     Height 10/03/20 1319 5\' 6"  (1.676 m)     Head Circumference --      Peak Flow --      Pain Score 10/03/20 1318 7     Pain Loc --      Pain Edu? --      Excl. in [place]? --    No data found.  Updated Vital Signs BP 130/83 (BP Location: Left Arm)   Pulse 73   Temp 97.9 F (36.6 C) (Oral)   Resp 16   Ht 5\' 6"  (1.676 m)   Wt 210 lb (95.3 kg)   LMP 12/15/1991 (Approximate)   SpO2 98%   BMI 33.89 kg/m   Visual Acuity Right Eye Distance:   Left Eye Distance:   Bilateral Distance:    Right Eye Near:   Left Eye Near:    Bilateral Near:     Physical Exam Constitutional:      General: She is not in acute distress.    Appearance: She is well-developed.  Eyes:     Conjunctiva/sclera: Conjunctivae normal.  Cardiovascular:     Rate and Rhythm: Normal rate and regular rhythm.     Heart sounds: Normal heart sounds.  Pulmonary:     Effort: Pulmonary effort is normal.     Breath sounds: Normal breath sounds.  Skin:    General: Skin is warm and dry.  Neurological:     Mental Status: She is alert and oriented to person, place, and time.      UC Treatments / Results  Labs (all labs ordered are listed, but only abnormal results are displayed) Labs Reviewed  GROUP A STREP BY PCR  SARS CORONAVIRUS 2 (TAT 6-24 HRS)     EKG   Radiology No results found.  Procedures Procedures (including critical care time)  Medications  Ordered in UC Medications - No data to display  Initial Impression / Assessment and Plan / UC Course  I have reviewed the triage vital signs and the nursing notes.  Pertinent labs & imaging results that were available during my care of the patient were reviewed by me and considered in my medical decision making (see chart for details).     Non toxic. Benign physical exam.  History and physical consistent with viral illness.  Covid testing pending and isolation instructions provided.  Supportive cares recommended. Return precautions provided. Patient verbalized understanding and agreeable to plan.   Final Clinical Impressions(s) / UC Diagnoses   Final diagnoses:  Upper respiratory tract infection, unspecified type  Exposure to COVID-19 virus     Discharge Instructions     This is concerning for covid-19 given the exposure you have had.  Push fluids to ensure adequate hydration and keep secretions thin.  Tylenol and/or ibuprofen as needed for pain or fevers.  Over the counter medications such as mucinex d as needed for symptoms.  You may try some vitamins to help your immune system potentially:  Vitamin C 500mg  twice a day. Zinc 50mg  daily. Vitamin D 5000IU daily.   Self isolate until covid results are back.  We will notify you by phone if it is positive. Your negative results will be sent through your MyChart.    If it is positive you need to isolate from others for a total of 5 days. If no fever for 24 hours without medications, and symptoms improving you may end isolation on day 6, but wear a mask if around any others for an additional 5 days.   If symptoms worsen or do not improve in the next week to return to be seen or to follow up with your PCP.     ED Prescriptions    None     PDMP not reviewed this encounter.   Zigmund Gottron, NP 10/03/20 1650

## 2020-10-03 NOTE — Discharge Instructions (Signed)
This is concerning for covid-19 given the exposure you have had.  Push fluids to ensure adequate hydration and keep secretions thin.  Tylenol and/or ibuprofen as needed for pain or fevers.  Over the counter medications such as mucinex d as needed for symptoms.  You may try some vitamins to help your immune system potentially:  Vitamin C 500mg  twice a day. Zinc 50mg  daily. Vitamin D 5000IU daily.   Self isolate until covid results are back.  We will notify you by phone if it is positive. Your negative results will be sent through your MyChart.    If it is positive you need to isolate from others for a total of 5 days. If no fever for 24 hours without medications, and symptoms improving you may end isolation on day 6, but wear a mask if around any others for an additional 5 days.   If symptoms worsen or do not improve in the next week to return to be seen or to follow up with your PCP.

## 2020-10-03 NOTE — ED Triage Notes (Signed)
Patient c/o cough and sore throat that started on Wed. Patient denies fevers.

## 2020-10-04 LAB — SARS CORONAVIRUS 2 (TAT 6-24 HRS): SARS Coronavirus 2: POSITIVE — AB

## 2020-10-28 ENCOUNTER — Encounter: Payer: Self-pay | Admitting: Nurse Practitioner

## 2020-10-28 ENCOUNTER — Other Ambulatory Visit: Payer: Self-pay

## 2020-10-28 ENCOUNTER — Ambulatory Visit (INDEPENDENT_AMBULATORY_CARE_PROVIDER_SITE_OTHER): Payer: Medicare PPO | Admitting: Nurse Practitioner

## 2020-10-28 VITALS — BP 138/85 | HR 75 | Temp 98.0°F | Ht 64.69 in | Wt 212.2 lb

## 2020-10-28 DIAGNOSIS — I700 Atherosclerosis of aorta: Secondary | ICD-10-CM

## 2020-10-28 DIAGNOSIS — I10 Essential (primary) hypertension: Secondary | ICD-10-CM

## 2020-10-28 DIAGNOSIS — R7301 Impaired fasting glucose: Secondary | ICD-10-CM

## 2020-10-28 DIAGNOSIS — Z23 Encounter for immunization: Secondary | ICD-10-CM

## 2020-10-28 DIAGNOSIS — E782 Mixed hyperlipidemia: Secondary | ICD-10-CM

## 2020-10-28 DIAGNOSIS — F3342 Major depressive disorder, recurrent, in full remission: Secondary | ICD-10-CM | POA: Diagnosis not present

## 2020-10-28 DIAGNOSIS — E039 Hypothyroidism, unspecified: Secondary | ICD-10-CM

## 2020-10-28 DIAGNOSIS — N3281 Overactive bladder: Secondary | ICD-10-CM | POA: Insufficient documentation

## 2020-10-28 DIAGNOSIS — Z6835 Body mass index (BMI) 35.0-35.9, adult: Secondary | ICD-10-CM

## 2020-10-28 DIAGNOSIS — Z8616 Personal history of COVID-19: Secondary | ICD-10-CM | POA: Insufficient documentation

## 2020-10-28 DIAGNOSIS — L989 Disorder of the skin and subcutaneous tissue, unspecified: Secondary | ICD-10-CM | POA: Insufficient documentation

## 2020-10-28 LAB — URINALYSIS, ROUTINE W REFLEX MICROSCOPIC
Bilirubin, UA: NEGATIVE
Glucose, UA: NEGATIVE
Ketones, UA: NEGATIVE
Leukocytes,UA: NEGATIVE
Nitrite, UA: NEGATIVE
Protein,UA: NEGATIVE
Specific Gravity, UA: 1.015 (ref 1.005–1.030)
Urobilinogen, Ur: 0.2 mg/dL (ref 0.2–1.0)
pH, UA: 7 (ref 5.0–7.5)

## 2020-10-28 LAB — WET PREP FOR TRICH, YEAST, CLUE
Clue Cell Exam: NEGATIVE
Trichomonas Exam: NEGATIVE
Yeast Exam: NEGATIVE

## 2020-10-28 LAB — MICROSCOPIC EXAMINATION
Bacteria, UA: NONE SEEN
WBC, UA: NONE SEEN /hpf (ref 0–5)

## 2020-10-28 LAB — BAYER DCA HB A1C WAIVED: HB A1C (BAYER DCA - WAIVED): 5 % (ref <7.0)

## 2020-10-28 MED ORDER — LEVOTHYROXINE SODIUM 150 MCG PO TABS: 150.0000 ug | ORAL_TABLET | Freq: Every day | ORAL | 4 refills | Status: DC

## 2020-10-28 MED ORDER — LOSARTAN POTASSIUM 100 MG PO TABS
100.0000 mg | ORAL_TABLET | Freq: Every day | ORAL | 4 refills | Status: DC
Start: 2020-10-28 — End: 2022-01-05

## 2020-10-28 NOTE — Assessment & Plan Note (Signed)
A1c 5% today and urine ALB negative.  Praised for recent weight loss and diet changes.  Continue this focus as is not out of prediabetic range.

## 2020-10-28 NOTE — Assessment & Plan Note (Signed)
Present for a long while with no color or size changes, is itchy.  Appears like inflamed SK, she is followed by dermatology and will call to schedule with them.

## 2020-10-28 NOTE — Assessment & Plan Note (Signed)
Chronic, ongoing.  No current statin, refuses.  ASCVD 16.1%, statin recommended.  Will obtain lipid panel fasting next visit.

## 2020-10-28 NOTE — Assessment & Plan Note (Signed)
Chronic, ongoing.  Continue current Levothyroxine dose and adjust as needed.  Obtain TSH and Free T4 today. 

## 2020-10-28 NOTE — Assessment & Plan Note (Signed)
Has lost 40 pounds over past several months with [employer] diet.  Recommend to continue this.  Recommended eating smaller high protein, low fat meals more frequently and exercising 30 mins a day 5 times a week with a goal of 10-15lb weight loss in the next 3 months. Patient voiced their understanding and motivation to adhere to these recommendations.

## 2020-10-28 NOTE — Patient Instructions (Addendum)
Ditropan or Mybetriq    Overactive Bladder, Adult  Overactive bladder is a condition in which a person has a sudden and frequent need to urinate. A person might also leak urine if he or she cannot get to the bathroom fast enough (urinary incontinence). Sometimes, symptoms can interfere with work or social activities. What are the causes? Overactive bladder is associated with poor nerve signals between your bladder and your brain. Your bladder may get the signal to empty before it is full. You may also have very sensitive muscles that make your bladder squeeze too soon. This condition may also be caused by other factors, such as:  Medical conditions: ? Urinary tract infection. ? Infection of nearby tissues. ? Prostate enlargement. ? Bladder stones, inflammation, or tumors. ? Diabetes. ? Muscle or nerve weakness, especially from these conditions:  A spinal cord injury.  Stroke.  Multiple sclerosis.  Parkinson's disease.  Other causes: ? Surgery on the uterus or urethra. ? Drinking too much caffeine or alcohol. ? Certain medicines, especially those that eliminate extra fluid in the body (diuretics). ? Constipation. What increases the risk? You may be at greater risk for overactive bladder if you:  Are an older adult.  Smoke.  Are going through menopause.  Have prostate problems.  Have a neurological disease, such as stroke, dementia, Parkinson's disease, or multiple sclerosis (MS).  Eat or drink alcohol, spicy food, caffeine, and other things that irritate the bladder.  Are overweight or obese. What are the signs or symptoms? Symptoms of this condition include a sudden, strong urge to urinate. Other symptoms include:  Leaking urine.  Urinating 8 or more times a day.  Waking up to urinate 2 or more times overnight. How is this diagnosed? This condition may be diagnosed based on:  Your symptoms and medical history.  A physical exam.  Blood or urine tests to  check for possible causes, such as infection. You may also need to see a health care provider who specializes in urinary tract problems. This is called a urologist. How is this treated? Treatment for overactive bladder depends on the cause of your condition and whether it is mild or severe. Treatment may include:  Bladder training, such as: ? Learning to control the urge to urinate by following a schedule to urinate at regular intervals. ? Doing Kegel exercises to strengthen the pelvic floor muscles that support your bladder.  Special devices, such as: ? Biofeedback. This uses sensors to help you become aware of your body's signals. ? Electrical stimulation. This uses electrodes placed inside the body (implanted) or outside the body. These electrodes send gentle pulses of electricity to strengthen the nerves or muscles that control the bladder. ? Women may use a plastic device, called a pessary, that fits into the vagina and supports the bladder.  Medicines, such as: ? Antibiotics to treat bladder infection. ? Antispasmodics to stop the bladder from releasing urine at the wrong time. ? Tricyclic antidepressants to relax bladder muscles. ? Injections of botulinum toxin type A directly into the bladder tissue to relax bladder muscles.  Surgery, such as: ? A device may be implanted to help manage the nerve signals that control urination. ? An electrode may be implanted to stimulate electrical signals in the bladder. ? A procedure may be done to change the shape of the bladder. This is done only in very severe cases. Follow these instructions at home: Eating and drinking  Make diet or lifestyle changes recommended by your health care provider.  These may include: ? Drinking fluids throughout the day and not only with meals. ? Cutting down on caffeine or alcohol. ? Eating a healthy and balanced diet to prevent constipation. This may include:  Choosing foods that are high in fiber, such as  beans, whole grains, and fresh fruits and vegetables.  Limiting foods that are high in fat and processed sugars, such as fried and sweet foods.   Lifestyle  Lose weight if needed.  Do not use any products that contain nicotine or tobacco. These include cigarettes, chewing tobacco, and vaping devices, such as e-cigarettes. If you need help quitting, ask your health care provider.   General instructions  Take over-the-counter and prescription medicines only as told by your health care provider.  If you were prescribed an antibiotic medicine, take it as told by your health care provider. Do not stop taking the antibiotic even if you start to feel better.  Use any implants or pessary as told by your health care provider.  If needed, wear pads to absorb urine leakage.  Keep a log to track how much and when you drink, and when you need to urinate. This will help your health care provider monitor your condition.  Keep all follow-up visits. This is important. Contact a health care provider if:  You have a fever or chills.  Your symptoms do not get better with treatment.  Your pain and discomfort get worse.  You have more frequent urges to urinate. Get help right away if:  You are not able to control your bladder. Summary  Overactive bladder refers to a condition in which a person has a sudden and frequent need to urinate.  Several conditions may lead to an overactive bladder.  Treatment for overactive bladder depends on the cause and severity of your condition.  Making lifestyle changes, doing Kegel exercises, keeping a log, and taking medicines can help with this condition. This information is not intended to replace advice given to you by your health care provider. Make sure you discuss any questions you have with your health care provider. Document Revised: 05/31/2020 Document Reviewed: 05/31/2020 Elsevier Patient Education  2021 [employer].

## 2020-10-28 NOTE — Progress Notes (Signed)
BP 138/85   Pulse 75   Temp 98 F (36.7 C) (Oral)   Ht 5' 4.69" (1.643 m)   Wt 212 lb 3.2 oz (96.3 kg)   LMP 12/15/1991 (Approximate)   SpO2 97%   BMI 35.66 kg/m    Subjective:    Patient ID: Tiffany Delacruz, female    DOB: 12-May-1951, 70 y.o.   MRN: [id number]  HPI: Tiffany Delacruz is a 70 y.o. female  Chief Complaint  Patient presents with  . Incontinence  . Nevus    Has an mole? On her back that itches that patient would like to have looked at   . Cough    Has had since contracting Covid beginning of January  . Hypertension  . Depression  . Hypothyroidism   HYPERTENSION Continues to take this with Losartan 100 MG only.  Has lost 40 pounds since last saw provider - [place].  She self stopped her Lasix and Metoprolol due to weight loss and hypotension on checks at home.   Hypertension status: stable  Satisfied with current treatment? no Duration of hypertension: chronic BP monitoring frequency:  daily BP range: 120-130/80 range at home BP medication side effects:  no Medication compliance: good compliance Aspirin: no Recurrent headaches: no Visual changes: no Palpitations: no Dyspnea: no Chest pain: no Lower extremity edema: no Dizzy/lightheaded: no The 10-year ASCVD risk score Mikey Bussing DC Jr., et al., 2013) is: 16.1%   Values used to calculate the score:     Age: 54 years     Sex: Female     Is Non-Hispanic African American: No     Diabetic: No     Tobacco smoker: No     Systolic Blood Pressure: 865 mmHg     Is BP treated: Yes     HDL Cholesterol: 55 mg/dL     Total Cholesterol: 202 mg/dL   DEPRESSION She self stopped her Prozac 6 months ago, is doing well without it.   Mood status: controlled Psychotherapy/counseling: none Depressed mood: no Anxious mood: no Anhedonia: no Significant weight loss or gain: no Insomnia: none Fatigue: no Feelings of worthlessness or guilt: no Impaired concentration/indecisiveness: no Suicidal ideations:  no Hopelessness: no Crying spells: no Depression screen [facility] 2/9 10/28/2020 06/27/2019 11/14/2018 08/13/2018 05/13/2018  Decreased Interest 0 0 0 0 0  Down, Depressed, Hopeless 0 0 0 0 0  PHQ - 2 Score 0 0 0 0 0  Altered sleeping 0 0 - 0 -  Tired, decreased energy 0 3 - 0 -  Change in appetite 0 0 - 0 -  Feeling bad or failure about yourself  0 0 - 0 -  Trouble concentrating 0 0 - 0 -  Moving slowly or fidgety/restless 0 0 - 0 -  Suicidal thoughts 0 0 - 0 -  PHQ-9 Score 0 3 - 0 -  Difficult doing work/chores - - - Not difficult at all -    HYPOTHYROIDISM Continues on Levothyroxine 150 MCG with last TSH 2.260 in November 2020. Thyroid control status:stable Satisfied with current treatment? yes Medication side effects: no Medication compliance: good compliance Etiology of hypothyroidism:  Recent dose adjustment:no Fatigue: no Cold intolerance: no Heat intolerance: no Weight gain: no Weight loss: no Constipation: no Diarrhea/loose stools: no Palpitations: no Lower extremity edema: no Anxiety/depressed mood: no  URINARY INCONTINENCE Has been present for 6 months.  Is doing [place] and is supposed to drink a lot of water with this.  Has one child, vaginal birth.  Has involuntary incontinence often -- tried Kegel exercise with no benefit at home.  Is wearing panty liners, has to change 3-4 times a day.  Reports incontinence just happens, no urgency.   Dysuria: no Urinary frequency: yes Urgency: yes Small volume voids: no Symptom severity: yes Urinary incontinence: yes Foul odor: mild Hematuria: no Abdominal pain: no Back pain: no Suprapubic pain/pressure: no Flank pain: no Fever:  no Vomiting: no Status: worsening Sexual activity: No sexually active Treatments attempted: Kegel  SKIN LESION Has mole on back, is itchy.  Been there for long time, recently started to become itchy -- over past 2 months.   Skin Care follows -- Dr. Nehemiah Massed.  Duration:  months Location: back Painful: no Itching: yes Onset: gradual Context: not changing Associated signs and symptoms: itchy History of skin cancer: no History of precancerous skin lesions: no Family history of skin cancer: no   HISTORY OF COVID Tested positive for Covid 09/26/20 -- has ongoing cough.   Duration: weeks Circumstances of initial development of cough: Covid Cough severity: mild Cough description: non-productive and dry Aggravating factors:  all day Alleviating factors: nothing Status:  stable Treatments attempted: occasional Mucinex -- this helps a lot Wheezing: no Shortness of breath: no Chest pain: no Chest tightness:no Nasal congestion: no Runny nose: no Postnasal drip: no Frequent throat clearing or swallowing: no Hemoptysis: no Fevers: no Night sweats: no Weight loss: no Heartburn: no Recent foreign travel: no Tuberculosis contacts: no  Relevant past medical, surgical, family and social history reviewed and updated as indicated. Interim medical history since our last visit reviewed. Allergies and medications reviewed and updated.  Review of Systems  Constitutional: Negative for activity change, appetite change, diaphoresis, fatigue and fever.  Respiratory: Positive for cough. Negative for chest tightness, shortness of breath and wheezing.   Cardiovascular: Negative for chest pain, palpitations and leg swelling.  Gastrointestinal: Negative.   Endocrine: Negative for cold intolerance, heat intolerance, polydipsia, polyphagia and polyuria.  Genitourinary: Positive for frequency and urgency. Negative for decreased urine volume, dysuria and vaginal discharge.  Skin: Negative for rash.  Neurological: Negative.   Psychiatric/Behavioral: Negative.     Per HPI unless specifically indicated above     Objective:    BP 138/85   Pulse 75   Temp 98 F (36.7 C) (Oral)   Ht 5' 4.69" (1.643 m)   Wt 212 lb 3.2 oz (96.3 kg)   LMP 12/15/1991 (Approximate)    SpO2 97%   BMI 35.66 kg/m   Wt Readings from Last 3 Encounters:  10/28/20 212 lb 3.2 oz (96.3 kg)  10/03/20 210 lb (95.3 kg)  10/22/19 246 lb 12 oz (111.9 kg)    Physical Exam Vitals and nursing note reviewed.  Constitutional:      General: She is awake. She is not in acute distress.    Appearance: She is well-developed and well-groomed. She is obese. She is not ill-appearing.  HENT:     Head: Normocephalic.     Right Ear: Hearing, tympanic membrane, ear canal and external ear normal. No drainage.     Left Ear: Hearing, tympanic membrane, ear canal and external ear normal. No drainage.  Eyes:     General: Lids are normal.        Right eye: No discharge.        Left eye: No discharge.     Conjunctiva/sclera: Conjunctivae normal.     Pupils: Pupils are equal, round, and reactive to light.  Neck:  Thyroid: No thyromegaly.     Vascular: No carotid bruit.  Cardiovascular:     Rate and Rhythm: Normal rate and regular rhythm.     Heart sounds: Normal heart sounds. No murmur heard. No gallop.   Pulmonary:     Effort: Pulmonary effort is normal. No accessory muscle usage or respiratory distress.     Breath sounds: Normal breath sounds.  Abdominal:     General: Bowel sounds are normal. There is no distension.     Palpations: Abdomen is soft.     Tenderness: There is no abdominal tenderness. There is no right CVA tenderness or left CVA tenderness.  Musculoskeletal:     Cervical back: Normal range of motion and neck supple.     Right lower leg: No edema.     Left lower leg: No edema.  Lymphadenopathy:     Head:     Right side of head: No submental, submandibular, tonsillar, preauricular or posterior auricular adenopathy.     Left side of head: No submental, submandibular, tonsillar, preauricular or posterior auricular adenopathy.     Cervical: No cervical adenopathy.  Skin:    General: Skin is warm and dry.       Neurological:     Mental Status: She is alert and oriented to  person, place, and time.     Cranial Nerves: Cranial nerves are intact.     Gait: Gait is intact.     Deep Tendon Reflexes: Reflexes are normal and symmetric.     Reflex Scores:      Brachioradialis reflexes are 2+ on the right side and 2+ on the left side.      Patellar reflexes are 2+ on the right side and 2+ on the left side. Psychiatric:        Attention and Perception: Attention normal.        Mood and Affect: Mood normal.        Speech: Speech normal.        Behavior: Behavior normal. Behavior is cooperative.        Thought Content: Thought content normal.        Judgment: Judgment normal.    Results for orders placed or performed in visit on 10/28/20  WET PREP FOR [place], YEAST, CLUE   Specimen: Sterile Swab   Sterile Swab  Result Value Ref Range   Trichomonas Exam Negative Negative   Yeast Exam Negative Negative   Clue Cell Exam Negative Negative  Microscopic Examination   Urine  Result Value Ref Range   WBC, UA None seen 0 - 5 /hpf   RBC 3-10 (A) 0 - 2 /hpf   Epithelial Cells (non renal) 0-10 0 - 10 /hpf   Bacteria, UA None seen None seen/Few  Urinalysis, Routine w reflex microscopic  Result Value Ref Range   Specific Gravity, UA 1.015 1.005 - 1.030   pH, UA 7.0 5.0 - 7.5   Color, UA Yellow Yellow   Appearance Ur Clear Clear   Leukocytes,UA Negative Negative   Protein,UA Negative Negative/Trace   Glucose, UA Negative Negative   Ketones, UA Negative Negative   RBC, UA Trace (A) Negative   Bilirubin, UA Negative Negative   Urobilinogen, Ur 0.2 0.2 - 1.0 mg/dL   Nitrite, UA Negative Negative   Microscopic Examination See below:   Bayer DCA Hb A1c Waived  Result Value Ref Range   HB A1C (BAYER DCA - WAIVED) 5.0 <7.0 %      Assessment & Plan:  Problem List Items Addressed This Visit      Cardiovascular and Mediastinum   Hypertension    Chronic, ongoing with BP at goal today.  Recommend she monitor BP at least a few mornings a week at home and document.   DASH diet at home.  Continue current medication regimen and adjust as needed.  Labs today: BMP.  Return in 6 months.       Relevant Medications   losartan (COZAAR) 100 MG tablet   Other Relevant Orders   Basic metabolic panel   Aortic atherosclerosis ([place])    Noted on past imaging, recommend she take a Baby ASA daily and recommend statin initiation in future for prevention, does not wish to start today.  Check lipid panel fasting next visit.        Relevant Medications   losartan (COZAAR) 100 MG tablet     Endocrine   Hypothyroidism    Chronic, ongoing.  Continue current Levothyroxine dose and adjust as needed.  Obtain TSH and Free T4 today.      Relevant Medications   levothyroxine (SYNTHROID) 150 MCG tablet   Other Relevant Orders   T4, free   TSH   IFG (impaired fasting glucose)    A1c 5% today and urine ALB negative.  Praised for recent weight loss and diet changes.  Continue this focus as is not out of prediabetic range.      Relevant Orders   Bayer DCA Hb A1c Waived (Completed)     Musculoskeletal and Integument   Skin lesion    Present for a long while with no color or size changes, is itchy.  Appears like inflamed SK, she is followed by dermatology and will call to schedule with them.        Genitourinary   Overactive bladder    Ongoing issue for several months with negative wet prep and UA with only trace RBC (3-10 microscopic) today.  Suspect OAB based on her symptoms and HPI.  Discussed medication options, such as Ditropan or Myrbetriq -- with preference for Myrbetriq due to safer in patients >65.  She will check with insurance to see cost of these and alert provider if wishes to trial one.  Referral for PT placed to work on pelvic floor therapy.  Return in 3 months for follow-up.      Relevant Orders   Urinalysis, Routine w reflex microscopic (Completed)   WET PREP FOR [place], YEAST, CLUE (Completed)   Ambulatory referral to Physical Therapy     Other    Hyperlipidemia    Chronic, ongoing.  No current statin, refuses.  ASCVD 16.1%, statin recommended.  Will obtain lipid panel fasting next visit.      Relevant Medications   losartan (COZAAR) 100 MG tablet   Depression - Primary    Chronic, stable.  Denies SI/HI.  Currently has been off medication for 6 months and wishes to continue off of this.  Will restart in future if needed.      Obesity    Has lost 40 pounds over past several months with [employer] diet.  Recommend to continue this.  Recommended eating smaller high protein, low fat meals more frequently and exercising 30 mins a day 5 times a week with a goal of 10-15lb weight loss in the next 3 months. Patient voiced their understanding and motivation to adhere to these recommendations.       History of 2019 novel coronavirus disease (COVID-19)    On 09/26/20 -- at this  time continues to have mild cough, but this is slowly improving.  No other symptoms.  Does not wish to trial Prednisone or Albuterol.  Wishes to continue Mucinex at home which offers benefit, this is appropriate.  If ongoing cough consider imaging and a Prednisone burst.  Return for worsening or ongoing.       Other Visit Diagnoses    Need for influenza vaccination       Flu vaccine today   Relevant Orders   Flu Vaccine QUAD High Dose(Fluad) (Completed)       Follow up plan: Return in about 3 months (around 01/25/2021) for Overactive bladder.

## 2020-10-28 NOTE — Assessment & Plan Note (Signed)
Chronic, stable.  Denies SI/HI.  Currently has been off medication for > 6 months and wishes to continue off of this.  Will restart in future if needed. 

## 2020-10-28 NOTE — Assessment & Plan Note (Signed)
On 09/26/20 -- at this time continues to have mild cough, but this is slowly improving.  No other symptoms.  Does not wish to trial Prednisone or Albuterol.  Wishes to continue Mucinex at home which offers benefit, this is appropriate.  If ongoing cough consider imaging and a Prednisone burst.  Return for worsening or ongoing.

## 2020-10-28 NOTE — Assessment & Plan Note (Addendum)
Ongoing issue for several months with negative wet prep and UA with only trace RBC (3-10 microscopic) today.  Suspect OAB based on her symptoms and HPI.  Discussed medication options, such as Ditropan or Myrbetriq -- with preference for Myrbetriq due to safer in patients >65.  She will check with insurance to see cost of these and alert provider if wishes to trial one.  Referral for PT placed to work on pelvic floor therapy.  Return in 3 months for follow-up.

## 2020-10-28 NOTE — Assessment & Plan Note (Signed)
Chronic, ongoing with BP at goal today.  Recommend she monitor BP at least a few mornings a week at home and document.  DASH diet at home.  Continue current medication regimen and adjust as needed.  Labs today: BMP.  Return in 6 months.

## 2020-10-28 NOTE — Assessment & Plan Note (Signed)
Noted on past imaging, recommend she take a Baby ASA daily and recommend statin initiation in future for prevention, does not wish to start today.  Check lipid panel fasting next visit.

## 2020-10-29 ENCOUNTER — Other Ambulatory Visit: Payer: Self-pay | Admitting: Nurse Practitioner

## 2020-10-29 DIAGNOSIS — E039 Hypothyroidism, unspecified: Secondary | ICD-10-CM

## 2020-10-29 MED ORDER — LEVOTHYROXINE SODIUM 125 MCG PO TABS: 125.0000 ug | ORAL_TABLET | Freq: Every day | ORAL | 3 refills | Status: DC

## 2020-10-29 NOTE — Progress Notes (Signed)
Contacted via MyChart  Good evening Tiffany Delacruz, some of your labs have returned and some are still pending.  Kidney function is pending.  Thyroid levels show TSH a bit too low and Free T4 a bit too high, meaning at current dose of Levothyroxine it is too much and causing you to be more hyperthyroid.  I am going to decrease dose to 125 MCG daily and would like you to return in 6 weeks for lab visit only.  Any questions?  Stop the 150 MCG dosing. Keep being awesome!!  Thank you for allowing me to participate in your care. Kindest regards, Tesla Bochicchio

## 2020-10-30 LAB — TSH: TSH: 0.377 u[IU]/mL — ABNORMAL LOW (ref 0.450–4.500)

## 2020-10-30 LAB — BASIC METABOLIC PANEL
BUN/Creatinine Ratio: 22 (ref 12–28)
BUN: 19 mg/dL (ref 8–27)
CO2: 23 mmol/L (ref 20–29)
Calcium: 9.6 mg/dL (ref 8.7–10.3)
Chloride: 99 mmol/L (ref 96–106)
Creatinine, Ser: 0.87 mg/dL (ref 0.57–1.00)
GFR calc Af Amer: 78 mL/min/{1.73_m2} (ref >59)
GFR calc non Af Amer: 67 mL/min/{1.73_m2} (ref >59)
Glucose: 98 mg/dL (ref 65–99)
Potassium: 4.3 mmol/L (ref 3.5–5.2)
Sodium: 138 mmol/L (ref 134–144)

## 2020-10-30 LAB — T4, FREE: Free T4: 2.4 ng/dL — ABNORMAL HIGH (ref 0.82–1.77)

## 2020-11-04 ENCOUNTER — Other Ambulatory Visit: Payer: Self-pay

## 2020-11-04 ENCOUNTER — Ambulatory Visit: Payer: Medicare PPO | Attending: Nurse Practitioner | Admitting: Physical Therapy

## 2020-11-04 DIAGNOSIS — M6281 Muscle weakness (generalized): Secondary | ICD-10-CM | POA: Insufficient documentation

## 2020-11-04 DIAGNOSIS — R278 Other lack of coordination: Secondary | ICD-10-CM | POA: Insufficient documentation

## 2020-11-04 NOTE — Therapy (Signed)
[facility] [address] [place], [place], [address] Phone: [phone]   Fax:  [phone]  Physical Therapy Evaluation  Patient Details  Name: Tiffany Delacruz MRN: [id number] Date of Birth: 1951/07/27 Referring Provider (PT): Marnee Guarneri   Encounter Date: 11/04/2020   PT End of Session - 11/04/20 0849    Visit Number 1    Number of Visits 8    Date for PT Re-Evaluation 12/30/20    Authorization Type IE 11/04/2020    PT Start Time 0900    PT Stop Time 0955    PT Time Calculation (min) 55 min    Activity Tolerance Patient tolerated treatment well    Behavior During Therapy [facility] for tasks assessed/performed           Past Medical History:  Diagnosis Date  . Anxiety   . Depression   . GERD (gastroesophageal reflux disease)   . Graves disease   . Hyperlipidemia   . Hypertension   . Hypothyroidism   . Obesity   . Osteoarthritis   . Sleep apnea    NO CPAP  . Wheezing 07/14/2018    Past Surgical History:  Procedure Laterality Date  . BACK SURGERY  1988  . BREAST BIOPSY Right 2007   benign, with marker  . CARPAL TUNNEL RELEASE Left 07/20/2017   Procedure: CARPAL TUNNEL RELEASE;  Surgeon: Dereck Leep, MD;  Location: [facility] ORS;  Service: Orthopedics;  Laterality: Left;  . cataracts surgery    . COLONOSCOPY    . disk repair    . EYE SURGERY Bilateral 10/10/2018  . JOINT REPLACEMENT Right 05/2013   knee replacement    There were no vitals filed for this visit.        [facility] PT Assessment - 11/04/20 0846      Assessment   Medical Diagnosis OAB    Referring Provider (PT) Marnee Guarneri    Onset Date/Surgical Date 05/04/20    Hand Dominance Right    Next MD Visit May 2022    Prior Therapy None for this dx      Balance Screen   Has the patient fallen in the past 6 months No      [place]: None Have you had any night sweats? Unexplained weight loss? Saddle anesthesia? Unexplained changes in bowel or bladder habits?  Precautions: None  SUBJECTIVE  Chief Complaint: Patient states that she has no control of her bladder. She reports that when she wakes up in the morning she has to hold it to get to the bathroom and is unsuccessful. She adds that anytime during the day the UI could happen; she cannot find a rhyme or reason. Patient notes that she can on occasion sleep through a night with no issue. Patient is on a diet where she is to drink 96 oz of water a day which has made her urinate more frequently as well and because of the frequency she is not always able to make it to the toilet without UI. Patient articulates that while the diet started in May 2021, the UI has been going on for longer and has worsened in the past 6 months. Patient has decreased caffeine intake and has tried decreasing fluid intake prior to bed with no noticed improvement. Patient has also tried [employer]   with no noticed improvement.  Pertinent History:  Falls Negative.  Scoliosis Positive. Pulmonary disease/dysfunction Positive for OSA. Surgical history: see above.   Recent Procedures/Tests/Findings:  negative wet prep and UA with only trace RBC (3-10 microscopic) 10/28/2020  Obstetrical History: G1P1 Deliveries: vaginal Tearing/Episiotomy:  Birthing position: back  Gynecological History: Hysterectomy: No Vaginal/Abdominal Endometriosis: Negative Pain with exam: Yes Patient had pain with last exam that lasted several days and was able to be managed with Tylenol. Patient describes pain as an irritation.   Urinary History: Incontinence: Positive. Onset: past 6 months increased Triggers: urgency 90%, coughing/sneezing 60%, unaware at times. Amount: Min/Mod. Fluid Intake: 80-96 oz H20, 2 cups of coffee caffeinated, no juices/sodas, flavored water  ([employer]) Number of protective undergarments: 3-4 Type of undergarments: washable pull ups (wear ever) Nocturia: 0x/night (most mornings patient wakes up wet) Frequency of urination: every 2 hours Pain with urination: Negative Difficulty initiating urination: Negative Intermittent stream: Positive Frequent UTI: Negative.   Gastrointestinal History: [place] Stool Chart: Type 5-6 (oat bran); Type 2 (previous state) Frequency of BMs: 1x/day Pain with defecation: Negative Straining with defecation: Negative Typical food intake: [employer] "lean and green" diet  Sexual activity/pain: Not active at present  Pain with intercourse: Negative.   Location of pain: introitus Current pain:  0/10  Max pain:  8/10 (lasted for a week after q-tip swab) Least pain:  0/10 Pain quality: pain quality: irritation  Radiating pain: No   Current activities:  Pool, bible study/church, decluttering, recumbent bike (3+x/week), weight lifting  Patient Goals:  "I could go somewhere and not worry about where the bathroom is"  Patient perception of overall health: Good-excellent  OBJECTIVE  Mental Status Patient is oriented to person, place and time.  Recent memory is intact.  Remote memory is intact.  Attention span and concentration are intact.  Expressive speech is intact.  Patient's fund of knowledge is within normal limits for educational level.  POSTURE/OBSERVATIONS:  Lumbar lordosis: WNL Iliac crest height: R elevated Shoulder height: L elevated Lumbar lateral shift: not grossly apparent Pelvic obliquity: not grossly apparent  GAIT: Grossly WNL.  RANGE OF MOTION: deferred 2/2 to time constraints   LEFT RIGHT  Lumbar forward flexion (65):      Lumbar extension (30):     Lumbar lateral flexion (25):     Thoracic and Lumbar rotation (30 degrees):       Hip Flexion (0-125):      Hip IR (0-45):     Hip ER (0-45):     Hip Abduction (0-40):     Hip extension (0-15):       STRENGTH:  MMT deferred 2/2 to time constraints  RLE LLE  Hip Flexion    Hip Extension    Hip Abduction     Hip Adduction     Hip ER     Hip IR     Knee Extension    Knee Flexion    Dorsiflexion     Plantarflexion (seated)     ABDOMINAL: deferred 2/2 to time constraints Palpation: Diastasis: Scar mobility: Rib flare:  SPECIAL TESTS: deferred 2/2 to time constraints  PHYSICAL PERFORMANCE MEASURES: STS:  Deep Squat: RLE STS: LLE STS:  6MWT:   EXTERNAL PELVIC EXAM: deferred 2/2 to time constraints Palpation: Breath coordination: Cued Lengthen: Cued Contraction: Cough:  INTERNAL VAGINAL EXAM: deferred 2/2 to time constraints Introitus Appears:  Skin integrity:  Scar mobility: Strength (PERF):  Symmetry: Palpation: Prolapse:   INTERNAL RECTAL EXAM: not indicated Strength (PERF): Symmetry:  Palpation: Prolapse:   OUTCOME MEASURES: FOTO Urinary Problem 41   ASSESSMENT Patient is a 70 year old presenting to clinic with chief complaints of UUI. Upon examination, patient demonstrates deficits in PFM coordination, pelvic pain, IAP management, posture as evidenced by UI with urge, stress, and functional components, 8/10 irritation after cotton swab test, elevated right IC and left scapula. Patient's responses on FOTO outcome measures (41) indicate moderate functional limitations/disability/distress. Patient's progress may be limited due to factors associated with aging and time since onset; however, patient's health behaviors and motivation are advantageous. Patient was able to achieve basic understanding of PFM functions during today's evaluation and responded positively to educational interventions. Patient will benefit from continued skilled therapeutic intervention to address deficits in PFM coordination, pelvic pain, IAP management, posture in order to increase function and improve overall QOL.  EDUCATION Patient educated on prognosis, POC, and provided with HEP including:  bladder diary. Patient articulated understanding and returned demonstration. Patient will benefit from further education in order to maximize compliance and understanding for long-term therapeutic gains.  TREATMENT Neuromuscular Re-education: Patient educated on primary functions of the pelvic floor including: posture/balance, sexual pleasure, storage and elimination of waste from the body, abdominal cavity closure, and breath coordination.   Objective measurements completed on examination: See above findings.      PT Long Term Goals - 11/04/20 1205      PT LONG TERM GOAL #1   Title Patient will demonstrate independence with HEP in order to maximize therapeutic gains and improve carryover from physical therapy sessions to ADLs in the home and community.    Baseline IE: not initiated    Time 8    Period Weeks    Status New    Target Date 12/30/20      PT LONG TERM GOAL #2   Title Patient will demonstrate improved function as evidenced by a score of 54 on FOTO measure for full participation in activities at home and in the community.    Baseline IE: 41    Time 8    Period Weeks    Status New    Target Date 12/30/20      PT LONG TERM GOAL #3   Title Patient will demonstrate circumferential and sequential contraction of >4/5 MMT, > 6 sec hold x10 and 5 consecutive quick flicks with </= 10 min rest between testing bouts, and relaxation of the PFM coordinated with breath for improved management of intra-abdominal pressure and normal bowel and bladder function without the presence of pain nor incontinence in order to improve participation at home and in the community.    Baseline IE: not demonstrated    Time 8    Period Weeks    Status New    Target Date 12/30/20      PT LONG TERM GOAL #4   Title Patient will demonstrate coordinated lengthening and relaxation of PFM with diaphragmatic inhalation in order to decrease spasm and allow for unrestricted elimination of urine/feces for  improved overall QOL.    Baseline IE: not demonstrated    Time 8    Period Weeks    Status New    Target Date 12/30/20      PT LONG TERM GOAL #5   Title Patient will indicate improved bladder control as evidenced by use of 1-2 protective undergarments over the course of a 24 hour period for improved confidence and ability to participate in community activities.    Baseline IE: 3-4    Time 8  Period Weeks    Status New    Target Date 12/30/20                  Plan - 11/04/20 0850    Clinical Impression Statement Patient is a 70 year old presenting to clinic with chief complaints of UUI. Upon examination, patient demonstrates deficits in PFM coordination, pelvic pain, IAP management, posture as evidenced by UI with urge, stress, and functional components, 8/10 irritation after cotton swab test, elevated right IC and left scapula. Patient's responses on FOTO outcome measures (41) indicate moderate functional limitations/disability/distress. Patient's progress may be limited due to factors associated with aging and time since onset; however, patient's health behaviors and motivation are advantageous. Patient was able to achieve basic understanding of PFM functions during today's evaluation and responded positively to educational interventions. Patient will benefit from continued skilled therapeutic intervention to address deficits in PFM coordination, pelvic pain, IAP management, posture in order to increase function and improve overall QOL.    Personal Factors and Comorbidities Age;Behavior Pattern;Comorbidity 3+;Past/Current Experience;Time since onset of injury/illness/exacerbation;Fitness    Comorbidities depression, HTN, hyperlipidemia, GERD, anxiety, OA, hypothyroidism, sleep apnea    Examination-Activity Limitations Sleep;Continence;Transfers;Locomotion Level;Lift    Examination-Participation Restrictions Community Activity;Driving    Stability/Clinical Decision Making  Evolving/Moderate complexity    Clinical Decision Making Moderate    Rehab Potential Fair    PT Frequency 1x / week    PT Duration 8 weeks    PT Treatment/Interventions Neuromuscular re-education;Therapeutic exercise;Therapeutic activities;Patient/family education;Manual techniques;Taping;Scar mobilization;Moist Heat;Cryotherapy;Electrical Stimulation;Biofeedback    PT Next Visit Plan physical assessment    PT Home Exercise Plan bladder diary    Consulted and Agree with Plan of Care Patient           Patient will benefit from skilled therapeutic intervention in order to improve the following deficits and impairments:  Postural dysfunction,Improper body mechanics,Decreased strength,Decreased coordination,Decreased balance,Decreased endurance,Decreased mobility,Decreased activity tolerance  Visit Diagnosis: Other lack of coordination  Muscle weakness (generalized)     Problem List Patient Active Problem List   Diagnosis Date Noted  . Overactive bladder 10/28/2020  . Skin lesion 10/28/2020  . History of 2019 novel coronavirus disease (COVID-19) 10/28/2020  . Aortic atherosclerosis ([place]) 10/23/2019  . Accelerating angina ([place]) 08/28/2019  . IFG (impaired fasting glucose) 06/27/2019  . OSA (obstructive sleep apnea) 06/27/2019  . Other allergic rhinitis 11/12/2015  . Graves disease 04/15/2015  . Osteoarthritis 04/15/2015  . Hypothyroidism 04/15/2015  . Hypertension 04/15/2015  . Hyperlipidemia 04/15/2015  . Depression 04/15/2015  . GERD (gastroesophageal reflux disease) 04/15/2015  . Obesity 04/15/2015   Myles Gip PT, DPT [phone]  11/04/2020, 12:08 PM   [facility] [address] [place], [place], [address] Phone: [phone]   Fax:  [phone]  Name: Delisia Mcquiston MRN: [id number] Date of Birth: 1951/06/27

## 2020-11-11 ENCOUNTER — Other Ambulatory Visit: Payer: Self-pay

## 2020-11-11 ENCOUNTER — Ambulatory Visit: Payer: Medicare PPO | Admitting: Physical Therapy

## 2020-11-11 ENCOUNTER — Encounter: Payer: Self-pay | Admitting: Physical Therapy

## 2020-11-11 DIAGNOSIS — R278 Other lack of coordination: Secondary | ICD-10-CM | POA: Diagnosis not present

## 2020-11-11 DIAGNOSIS — M6281 Muscle weakness (generalized): Secondary | ICD-10-CM

## 2020-11-11 NOTE — Therapy (Signed)
[facility] [address] [place], [place], [address] Phone: [phone]   Fax:  [phone]  Physical Therapy Treatment  Patient Details  Name: Tiffany Delacruz MRN: [id number] Date of Birth: 1951/03/30 Referring Provider (PT): Marnee Guarneri   Encounter Date: 11/11/2020   PT End of Session - 11/11/20 1105    Visit Number 2    Number of Visits 8    Date for PT Re-Evaluation 12/30/20    Authorization Type IE 11/04/2020    PT Start Time 1056    PT Stop Time 1150    PT Time Calculation (min) 54 min    Activity Tolerance Patient tolerated treatment well    Behavior During Therapy [facility] for tasks assessed/performed           Past Medical History:  Diagnosis Date  . Anxiety   . Depression   . GERD (gastroesophageal reflux disease)   . Graves disease   . Hyperlipidemia   . Hypertension   . Hypothyroidism   . Obesity   . Osteoarthritis   . Sleep apnea    NO CPAP  . Wheezing 07/14/2018    Past Surgical History:  Procedure Laterality Date  . BACK SURGERY  1988  . BREAST BIOPSY Right 2007   benign, with marker  . CARPAL TUNNEL RELEASE Left 07/20/2017   Procedure: CARPAL TUNNEL RELEASE;  Surgeon: Dereck Leep, MD;  Location: [facility] ORS;  Service: Orthopedics;  Laterality: Left;  . cataracts surgery    . COLONOSCOPY    . disk repair    . EYE SURGERY Bilateral 10/10/2018  . JOINT REPLACEMENT Right 05/2013   knee replacement    There were no vitals filed for this visit.   Subjective Assessment - 11/11/20 1058    Subjective Patient presents to clinic with bladder diary with indications for frequent urination with incomplete filling of bladder. Patient had fewest instances of UI (x4) on day with most physical activity.    Currently in Pain? No/denies           TREATMENT  Pre-treatment assessment: RANGE OF MOTION:    LEFT RIGHT  Lumbar forward flexion (65):  WNL    Lumbar extension (30): WNL    Lumbar lateral  flexion (25):  WNL WNL  Thoracic and Lumbar rotation (30 degrees):    WNL WNL  Hip Flexion (0-125):   [facility] WFL  Hip IR (0-45):  [facility] WFL  Hip ER (0-45):  [facility] WFL  Hip Abduction (0-40):  [facility] WFL  Hip extension (0-15):  [facility] WFL    SENSATION: Grossly intact to light touch bilateral LEs as determined by testing dermatomes L2-S2. Patient noting mildly more prominent sensation on R when compared to L. Proprioception and hot/cold testing deferred on this date  STRENGTH: MMT   RLE LLE  Hip Flexion 5 5  Hip Extension 5 5  Hip Abduction  5 5  Hip Adduction  5 5  Hip ER  5 5  Hip IR  5 5  Knee Extension 5 5  Knee Flexion 5 5  Dorsiflexion  5 5  Plantarflexion (seated) 5 5   ABDOMINAL:  Palpation: No TTP; increased urge with deep palpation os LLQ Diastasis: none noted Rib flare: present B  SPECIAL TESTS: SLR (SN 92, -LR 0.29): R: Negative L:  Negative FABER (SN 81): R: Negative L: Negative FADIR (SN 94): R: Negative L: Negative  EXTERNAL PELVIC EXAM: Patient educated on the purpose of the pelvic exam and  articulated understanding; patient consented to the exam verbally. Palpation: no TTP Breath coordination: present Cued Lengthen: coordinated Cued Contraction: 1/5MMT with significant compensations from hip adductors and glutes.  Cough: no palpable PFM activity  Neuromuscular Re-education: Supine hooklying diaphragmatic breathing with VCs and TCs for downregulation of the nervous system and improved management of IAP Supine hooklying, PFM lengthening with inhalation. VCs and TCs to decrease compensatory patterns and encourage optimal relaxation of the PFM. Seated PFM lengthening with inhalation and washcloth feedback. VCs and TCs to decrease compensatory patterns and encourage optimal relaxation of the PFM.   Patient educated throughout session on appropriate technique and form using multi-modal cueing, HEP, and activity modification. Patient articulated understanding and returned  demonstration.  Patient Response to interventions: Comfortable with PFM HEP  ASSESSMENT Patient presents to clinic with excellent motivation to participate in therapy. Patient demonstrates deficits in PFM coordination, pelvic pain, IAP management, posture. Patient able to achieve basic coordination of PFM with breath during today's session and responded positively to active and educational interventions. Patient will benefit from continued skilled therapeutic intervention to address remaining deficits in PFM coordination, pelvic pain, IAP management, posture in order to increase function and improve overall QOL.     PT Long Term Goals - 11/04/20 1205      PT LONG TERM GOAL #1   Title Patient will demonstrate independence with HEP in order to maximize therapeutic gains and improve carryover from physical therapy sessions to ADLs in the home and community.    Baseline IE: not initiated    Time 8    Period Weeks    Status New    Target Date 12/30/20      PT LONG TERM GOAL #2   Title Patient will demonstrate improved function as evidenced by a score of 54 on FOTO measure for full participation in activities at home and in the community.    Baseline IE: 41    Time 8    Period Weeks    Status New    Target Date 12/30/20      PT LONG TERM GOAL #3   Title Patient will demonstrate circumferential and sequential contraction of >4/5 MMT, > 6 sec hold x10 and 5 consecutive quick flicks with </= 10 min rest between testing bouts, and relaxation of the PFM coordinated with breath for improved management of intra-abdominal pressure and normal bowel and bladder function without the presence of pain nor incontinence in order to improve participation at home and in the community.    Baseline IE: not demonstrated    Time 8    Period Weeks    Status New    Target Date 12/30/20      PT LONG TERM GOAL #4   Title Patient will demonstrate coordinated lengthening and relaxation of PFM with diaphragmatic  inhalation in order to decrease spasm and allow for unrestricted elimination of urine/feces for improved overall QOL.    Baseline IE: not demonstrated    Time 8    Period Weeks    Status New    Target Date 12/30/20      PT LONG TERM GOAL #5   Title Patient will indicate improved bladder control as evidenced by use of 1-2 protective undergarments over the course of a 24 hour period for improved confidence and ability to participate in community activities.    Baseline IE: 3-4    Time 8    Period Weeks    Status New    Target Date 12/30/20  Plan - 11/11/20 1306    Clinical Impression Statement Patient presents to clinic with excellent motivation to participate in therapy. Patient demonstrates deficits in PFM coordination, pelvic pain, IAP management, posture. Patient able to achieve basic coordination of PFM with breath during today's session and responded positively to active and educational interventions. Patient will benefit from continued skilled therapeutic intervention to address remaining deficits in PFM coordination, pelvic pain, IAP management, posture in order to increase function and improve overall QOL.    Personal Factors and Comorbidities Age;Behavior Pattern;Comorbidity 3+;Past/Current Experience;Time since onset of injury/illness/exacerbation;Fitness    Comorbidities depression, HTN, hyperlipidemia, GERD, anxiety, OA, hypothyroidism, sleep apnea    Examination-Activity Limitations Sleep;Continence;Transfers;Locomotion Level;Lift    Examination-Participation Restrictions Community Activity;Driving    Stability/Clinical Decision Making Evolving/Moderate complexity    Rehab Potential Fair    PT Frequency 1x / week    PT Duration 8 weeks    PT Treatment/Interventions Neuromuscular re-education;Therapeutic exercise;Therapeutic activities;Patient/family education;Manual techniques;Taping;Scar mobilization;Moist Heat;Cryotherapy;Electrical  Stimulation;Biofeedback    PT Next Visit Plan PFM strengthening & deep core phase 1; bladder habits basics    PT Home Exercise Plan PFM coordination    Consulted and Agree with Plan of Care Patient           Patient will benefit from skilled therapeutic intervention in order to improve the following deficits and impairments:  Postural dysfunction,Improper body mechanics,Decreased strength,Decreased coordination,Decreased balance,Decreased endurance,Decreased mobility,Decreased activity tolerance  Visit Diagnosis: Other lack of coordination  Muscle weakness (generalized)     Problem List Patient Active Problem List   Diagnosis Date Noted  . Overactive bladder 10/28/2020  . Skin lesion 10/28/2020  . History of 2019 novel coronavirus disease (COVID-19) 10/28/2020  . Aortic atherosclerosis ([place]) 10/23/2019  . Accelerating angina ([place]) 08/28/2019  . IFG (impaired fasting glucose) 06/27/2019  . OSA (obstructive sleep apnea) 06/27/2019  . Other allergic rhinitis 11/12/2015  . Graves disease 04/15/2015  . Osteoarthritis 04/15/2015  . Hypothyroidism 04/15/2015  . Hypertension 04/15/2015  . Hyperlipidemia 04/15/2015  . Depression 04/15/2015  . GERD (gastroesophageal reflux disease) 04/15/2015  . Obesity 04/15/2015   Myles Gip PT, DPT [phone]  11/11/2020, 1:10 PM  [facility] [address] [place], [place], [address] Phone: [phone]   Fax:  [phone]  Name: Tiffany Delacruz MRN: [id number] Date of Birth: [birth year]

## 2020-11-16 ENCOUNTER — Encounter: Payer: Medicare PPO | Admitting: Physical Therapy

## 2020-11-17 ENCOUNTER — Encounter: Payer: Self-pay | Admitting: Physical Therapy

## 2020-11-17 ENCOUNTER — Ambulatory Visit: Payer: Medicare PPO | Admitting: Physical Therapy

## 2020-11-17 ENCOUNTER — Other Ambulatory Visit: Payer: Self-pay

## 2020-11-17 DIAGNOSIS — M6281 Muscle weakness (generalized): Secondary | ICD-10-CM

## 2020-11-17 DIAGNOSIS — R278 Other lack of coordination: Secondary | ICD-10-CM

## 2020-11-17 NOTE — Therapy (Signed)
[facility] [address] [place], [place], [address] Phone: [phone]   Fax:  [phone]  Physical Therapy Treatment  Patient Details  Name: Tiffany Delacruz MRN: [id number] Date of Birth: 1951/04/09 Referring Provider (PT): Marnee Guarneri   Encounter Date: 11/17/2020   PT End of Session - 11/17/20 0910    Visit Number 3    Number of Visits 8    Date for PT Re-Evaluation 12/30/20    Authorization Type IE 11/04/2020    PT Start Time 0902    PT Stop Time 0945    PT Time Calculation (min) 43 min    Activity Tolerance Patient tolerated treatment well    Behavior During Therapy [facility] for tasks assessed/performed           Past Medical History:  Diagnosis Date  . Anxiety   . Depression   . GERD (gastroesophageal reflux disease)   . Graves disease   . Hyperlipidemia   . Hypertension   . Hypothyroidism   . Obesity   . Osteoarthritis   . Sleep apnea    NO CPAP  . Wheezing 07/14/2018    Past Surgical History:  Procedure Laterality Date  . BACK SURGERY  1988  . BREAST BIOPSY Right 2007   benign, with marker  . CARPAL TUNNEL RELEASE Left 07/20/2017   Procedure: CARPAL TUNNEL RELEASE;  Surgeon: Dereck Leep, MD;  Location: [facility] ORS;  Service: Orthopedics;  Laterality: Left;  . cataracts surgery    . COLONOSCOPY    . disk repair    . EYE SURGERY Bilateral 10/10/2018  . JOINT REPLACEMENT Right 05/2013   knee replacement    There were no vitals filed for this visit.   Subjective Assessment - 11/17/20 0907    Subjective Patient notes that she had one bad day this past week. She had increased UI on the bad day and notes that she was more sedentary on this day. Patient notes that she has been doing her exercises and does feel them to be mildly helpful.    Currently in Pain? No/denies           TREATMENT  Neuromuscular Re-education: Seated diaphragmatic breathing with VCs and TCs for downregulation of the nervous  system and improved management of IAP Seated, PFM lengthening with inhalation. VCs and TCs to decrease compensatory patterns and encourage optimal relaxation of the PFM. Seated thoracic extension and rotation with ball feedback and coordinated breath for improved spinal mobility Seated PFM with TrA activation with ball squeeze for improved PFM coordination and postural awareness Patient education on bladder irritants and strategies for buffering irritation.  Patient educated throughout session on appropriate technique and form using multi-modal cueing, HEP, and activity modification. Patient articulated understanding and returned demonstration.  Patient Response to interventions: Comfortable with new exercises  ASSESSMENT Patient presents to clinic with excellent motivation to participate in therapy. Patient demonstrates deficits in PFM coordination, pelvic pain, IAP management, posture. Patient able to coordinate all exercises with acceptable form during today's session and responded positively to active and educational interventions. Patient will benefit from continued skilled therapeutic intervention to address remaining deficits in PFM coordination, pelvic pain, IAP management, posture in order to increase function and improve overall QOL.     PT Long Term Goals - 11/04/20 1205      PT LONG TERM GOAL #1   Title Patient will demonstrate independence with HEP in order to maximize therapeutic gains and improve carryover from physical therapy  sessions to ADLs in the home and community.    Baseline IE: not initiated    Time 8    Period Weeks    Status New    Target Date 12/30/20      PT LONG TERM GOAL #2   Title Patient will demonstrate improved function as evidenced by a score of 54 on FOTO measure for full participation in activities at home and in the community.    Baseline IE: 41    Time 8    Period Weeks    Status New    Target Date 12/30/20      PT LONG TERM GOAL #3   Title  Patient will demonstrate circumferential and sequential contraction of >4/5 MMT, > 6 sec hold x10 and 5 consecutive quick flicks with </= 10 min rest between testing bouts, and relaxation of the PFM coordinated with breath for improved management of intra-abdominal pressure and normal bowel and bladder function without the presence of pain nor incontinence in order to improve participation at home and in the community.    Baseline IE: not demonstrated    Time 8    Period Weeks    Status New    Target Date 12/30/20      PT LONG TERM GOAL #4   Title Patient will demonstrate coordinated lengthening and relaxation of PFM with diaphragmatic inhalation in order to decrease spasm and allow for unrestricted elimination of urine/feces for improved overall QOL.    Baseline IE: not demonstrated    Time 8    Period Weeks    Status New    Target Date 12/30/20      PT LONG TERM GOAL #5   Title Patient will indicate improved bladder control as evidenced by use of 1-2 protective undergarments over the course of a 24 hour period for improved confidence and ability to participate in community activities.    Baseline IE: 3-4    Time 8    Period Weeks    Status New    Target Date 12/30/20                 Plan - 11/17/20 9702    Clinical Impression Statement Patient presents to clinic with excellent motivation to participate in therapy. Patient demonstrates deficits in PFM coordination, pelvic pain, IAP management, posture. Patient able to coordinate all exercises with acceptable form during today's session and responded positively to active and educational interventions. Patient will benefit from continued skilled therapeutic intervention to address remaining deficits in PFM coordination, pelvic pain, IAP management, posture in order to increase function and improve overall QOL.    Personal Factors and Comorbidities Age;Behavior Pattern;Comorbidity 3+;Past/Current Experience;Time since onset of  injury/illness/exacerbation;Fitness    Comorbidities depression, HTN, hyperlipidemia, GERD, anxiety, OA, hypothyroidism, sleep apnea    Examination-Activity Limitations Sleep;Continence;Transfers;Locomotion Level;Lift    Examination-Participation Restrictions Community Activity;Driving    Stability/Clinical Decision Making Evolving/Moderate complexity    Rehab Potential Fair    PT Frequency 1x / week    PT Duration 8 weeks    PT Treatment/Interventions Neuromuscular re-education;Therapeutic exercise;Therapeutic activities;Patient/family education;Manual techniques;Taping;Scar mobilization;Moist Heat;Cryotherapy;Electrical Stimulation;Biofeedback    PT Next Visit Plan PFM strengthening & deep core phase 1    PT Home Exercise Plan PFM coordination    Consulted and Agree with Plan of Care Patient           Patient will benefit from skilled therapeutic intervention in order to improve the following deficits and impairments:  Postural dysfunction,Improper body mechanics,Decreased strength,Decreased  coordination,Decreased balance,Decreased endurance,Decreased mobility,Decreased activity tolerance  Visit Diagnosis: Other lack of coordination  Muscle weakness (generalized)     Problem List Patient Active Problem List   Diagnosis Date Noted  . Overactive bladder 10/28/2020  . Skin lesion 10/28/2020  . History of 2019 novel coronavirus disease (COVID-19) 10/28/2020  . Aortic atherosclerosis ([place]) 10/23/2019  . Accelerating angina ([place]) 08/28/2019  . IFG (impaired fasting glucose) 06/27/2019  . OSA (obstructive sleep apnea) 06/27/2019  . Other allergic rhinitis 11/12/2015  . Graves disease 04/15/2015  . Osteoarthritis 04/15/2015  . Hypothyroidism 04/15/2015  . Hypertension 04/15/2015  . Hyperlipidemia 04/15/2015  . Depression 04/15/2015  . GERD (gastroesophageal reflux disease) 04/15/2015  . Obesity 04/15/2015   Myles Gip PT, DPT [phone]  11/17/2020, 12:36 PM  [facility] [address] [place], [place], [address] Phone: [phone]   Fax:  [phone]  Name: Aubreana Cornacchia MRN: [id number] Date of Birth: 28-Sep-1950

## 2020-11-25 ENCOUNTER — Ambulatory Visit: Payer: Medicare PPO | Admitting: Physical Therapy

## 2020-12-02 ENCOUNTER — Ambulatory Visit: Payer: Medicare PPO | Admitting: Physical Therapy

## 2020-12-09 ENCOUNTER — Ambulatory Visit: Payer: Medicare PPO | Attending: Nurse Practitioner | Admitting: Physical Therapy

## 2020-12-09 ENCOUNTER — Encounter: Payer: Self-pay | Admitting: Physical Therapy

## 2020-12-09 ENCOUNTER — Other Ambulatory Visit: Payer: Self-pay

## 2020-12-09 DIAGNOSIS — M6281 Muscle weakness (generalized): Secondary | ICD-10-CM | POA: Diagnosis present

## 2020-12-09 DIAGNOSIS — R278 Other lack of coordination: Secondary | ICD-10-CM | POA: Diagnosis not present

## 2020-12-09 NOTE — Therapy (Signed)
[facility] [address] [place], [place], [address] Phone: [phone]   Fax:  [phone]  Physical Therapy Treatment/Discharge  Patient Details  Name: Tiffany Delacruz MRN: [id number] Date of Birth: [birth year] Referring Provider (PT): Marnee Guarneri   Encounter Date: 12/09/2020   PT End of Session - 12/09/20 0904    Visit Number 4    Number of Visits 8    Date for PT Re-Evaluation 12/30/20    Authorization Type IE 11/04/2020    PT Start Time 0900    PT Stop Time 0955    PT Time Calculation (min) 55 min    Activity Tolerance Patient tolerated treatment well    Behavior During Therapy [facility] for tasks assessed/performed           Past Medical History:  Diagnosis Date  . Anxiety   . Depression   . GERD (gastroesophageal reflux disease)   . Graves disease   . Hyperlipidemia   . Hypertension   . Hypothyroidism   . Obesity   . Osteoarthritis   . Sleep apnea    NO CPAP  . Wheezing 07/14/2018    Past Surgical History:  Procedure Laterality Date  . BACK SURGERY  1988  . BREAST BIOPSY Right 2007   benign, with marker  . CARPAL TUNNEL RELEASE Left 07/20/2017   Procedure: CARPAL TUNNEL RELEASE;  Surgeon: Dereck Leep, MD;  Location: [facility] ORS;  Service: Orthopedics;  Laterality: Left;  . cataracts surgery    . COLONOSCOPY    . disk repair    . EYE SURGERY Bilateral 10/10/2018  . JOINT REPLACEMENT Right 05/2013   knee replacement    There were no vitals filed for this visit.   Subjective Assessment - 12/09/20 0900    Subjective Patient states that she continues to have one good day and one bad day. Patient continues to notice relationship between UI and sedentary days; when active, patient has "dry days."    Currently in Pain? No/denies           TREATMENT  Neuromuscular Re-education: Reassessed goals; see below.  Supine hooklying diaphragmatic breathing with VCs and TCs for downregulation of the nervous  system and improved management of IAP Supine hooklying, PFM lengthening with inhalation. VCs and TCs to decrease compensatory patterns and encourage optimal relaxation of the PFM. Supine hooklying, PFM contractions with exhalation. VCs and TCs to decrease compensatory patterns and encourage activation of the PFM. Patient education on urge suppression strategies as well as PFM training progressions for improved independent management of PFM coordination and UI.    Patient educated throughout session on appropriate technique and form using multi-modal cueing, HEP, and activity modification. Patient articulated understanding and returned demonstration.  Patient Response to interventions: Comfortable to discharge  ASSESSMENT Patient presents to clinic with excellent motivation to participate in therapy. Patient demonstrates moderate to minimal deficits in PFM coordination, pelvic pain, IAP management, posture and expresses readiness to discharge. Patient indicating improved bladder control and decreased disability as evidenced by meeting FOTO goal during today's session and responded positively to active and educational interventions. Patient is appropriate for a trial discharge to self-management but may benefit from continued skilled therapeutic intervention to address any remaining deficits in PFM coordination, pelvic pain, IAP management, posture in order to increase function and improve overall QOL.      PT Long Term Goals - 12/09/20 0906      PT LONG TERM GOAL #1   Title Patient will  demonstrate independence with HEP in order to maximize therapeutic gains and improve carryover from physical therapy sessions to ADLs in the home and community.    Baseline IE: not initiated; 3/17: IND    Time 8    Period Weeks    Status Achieved      PT LONG TERM GOAL #2   Title Patient will demonstrate improved function as evidenced by a score of 54 on FOTO measure for full participation in activities at  home and in the community.    Baseline IE: 41; 3/17: 55    Time 8    Period Weeks    Status Achieved      PT LONG TERM GOAL #3   Title Patient will demonstrate circumferential and sequential contraction of >4/5 MMT, > 6 sec hold x10 and 5 consecutive quick flicks with </= 10 min rest between testing bouts, and relaxation of the PFM coordinated with breath for improved management of intra-abdominal pressure and normal bowel and bladder function without the presence of pain nor incontinence in order to improve participation at home and in the community.    Baseline IE: not demonstrated; 3/17: 4/5 MMT, 5x quick flicks, 3 sec x3    Time 8    Period Weeks    Status Partially Met      PT LONG TERM GOAL #4   Title Patient will demonstrate coordinated lengthening and relaxation of PFM with diaphragmatic inhalation in order to decrease spasm and allow for unrestricted elimination of urine/feces for improved overall QOL.    Baseline IE: not demonstrated; 3/17: IND    Time 8    Period Weeks    Status Achieved      PT LONG TERM GOAL #5   Title Patient will indicate improved bladder control as evidenced by use of 1-2 protective undergarments over the course of a 24 hour period for improved confidence and ability to participate in community activities.    Baseline IE: 3-4; 3/17: good day 1-2, bad day 3    Time 8    Period Weeks    Status Partially Met    Target Date 12/30/20                 Plan - 12/09/20 0905    Clinical Impression Statement Patient presents to clinic with excellent motivation to participate in therapy. Patient demonstrates moderate to minimal deficits in PFM coordination, pelvic pain, IAP management, posture and expresses readiness to discharge. Patient indicating improved bladder control and decreased disability as evidenced by meeting FOTO goal during today's session and responded positively to active and educational interventions. Patient is appropriate for a trial  discharge to self-management but may benefit from continued skilled therapeutic intervention to address any remaining deficits in PFM coordination, pelvic pain, IAP management, posture in order to increase function and improve overall QOL.    Personal Factors and Comorbidities Age;Behavior Pattern;Comorbidity 3+;Past/Current Experience;Time since onset of injury/illness/exacerbation;Fitness    Comorbidities depression, HTN, hyperlipidemia, GERD, anxiety, OA, hypothyroidism, sleep apnea    Examination-Activity Limitations Sleep;Continence;Transfers;Locomotion Level;Lift    Examination-Participation Restrictions Community Activity;Driving    Stability/Clinical Decision Making Evolving/Moderate complexity    Rehab Potential Fair    PT Frequency 1x / week    PT Duration 8 weeks    PT Treatment/Interventions Neuromuscular re-education;Therapeutic exercise;Therapeutic activities;Patient/family education;Manual techniques;Taping;Scar mobilization;Moist Heat;Cryotherapy;Electrical Stimulation;Biofeedback    PT Next Visit Plan --    PT Home Exercise Plan --    Consulted and Agree with Plan of Care Patient  Patient will benefit from skilled therapeutic intervention in order to improve the following deficits and impairments:  Postural dysfunction,Improper body mechanics,Decreased strength,Decreased coordination,Decreased balance,Decreased endurance,Decreased mobility,Decreased activity tolerance  Visit Diagnosis: Other lack of coordination  Muscle weakness (generalized)     Problem List Patient Active Problem List   Diagnosis Date Noted  . Overactive bladder 10/28/2020  . Skin lesion 10/28/2020  . History of 2019 novel coronavirus disease (COVID-19) 10/28/2020  . Aortic atherosclerosis ([place]) 10/23/2019  . Accelerating angina ([place]) 08/28/2019  . IFG (impaired fasting glucose) 06/27/2019  . OSA (obstructive sleep apnea) 06/27/2019  . Other allergic rhinitis 11/12/2015  . Graves  disease 04/15/2015  . Osteoarthritis 04/15/2015  . Hypothyroidism 04/15/2015  . Hypertension 04/15/2015  . Hyperlipidemia 04/15/2015  . Depression 04/15/2015  . GERD (gastroesophageal reflux disease) 04/15/2015  . Obesity 04/15/2015   Myles Gip PT, DPT [phone]  12/09/2020, 12:27 PM  [facility] [address] [place], [place], [address] Phone: [phone]   Fax:  [phone]  Name: Dawne Casali MRN: [id number] Date of Birth: 07-13-1951

## 2020-12-16 ENCOUNTER — Ambulatory Visit: Payer: Medicare PPO | Admitting: Physical Therapy

## 2021-01-27 ENCOUNTER — Ambulatory Visit: Payer: Medicare PPO | Admitting: Nurse Practitioner

## 2021-01-27 ENCOUNTER — Encounter: Payer: Self-pay | Admitting: Nurse Practitioner

## 2021-01-27 ENCOUNTER — Other Ambulatory Visit: Payer: Self-pay

## 2021-01-27 VITALS — BP 135/80 | HR 66 | Temp 98.3°F | Wt 211.4 lb

## 2021-01-27 DIAGNOSIS — E039 Hypothyroidism, unspecified: Secondary | ICD-10-CM

## 2021-01-27 DIAGNOSIS — J3089 Other allergic rhinitis: Secondary | ICD-10-CM | POA: Diagnosis not present

## 2021-01-27 DIAGNOSIS — N3281 Overactive bladder: Secondary | ICD-10-CM

## 2021-01-27 MED ORDER — FLUTICASONE PROPIONATE 50 MCG/ACT NA SUSP: NASAL | 3 refills | Status: DC

## 2021-01-27 MED ORDER — MIRABEGRON ER 25 MG PO TB24: 25.0000 mg | ORAL_TABLET | Freq: Every day | ORAL | 3 refills | Status: DC

## 2021-01-27 NOTE — Progress Notes (Signed)
BP 135/80   Pulse 66   Temp 98.3 F (36.8 C) (Oral)   Wt 211 lb 6.4 oz (95.9 kg)   LMP 12/15/1991 (Approximate)   SpO2 96%   BMI 35.52 kg/m    Subjective:    Patient ID: Tiffany Delacruz, female    DOB: Oct 04, 1950, 70 y.o.   MRN: [id number]  HPI: Tiffany Delacruz is a 70 y.o. female  Chief Complaint  Patient presents with  . Overactive Bladder    Patient states the overactive bladder is still the same. Patient states it has not gotten any better or any worse, states it is has good days and bad days, up and down.   . Thyroid Problem    Patient states since starting the new medication about 2-3 weeks in, patient states she noticed her hair thinning.   . Medication Refill    Patient is requesting a refill on prescription for Flonase.    HYPOTHYROIDISM Continues on Levothyroxine 125 MCG with last TSH 0.377 and Free T4 in February 2022. Thyroid control status:stable Satisfied with current treatment? yes Medication side effects: no Medication compliance: good compliance Etiology of hypothyroidism:  Recent dose adjustment:yes to 125 MCG Fatigue: yes Cold intolerance: no Heat intolerance: no Weight gain: no Weight loss: no Constipation:yes Diarrhea/loose stools: no Palpitations: no Lower extremity edema: no Anxiety/depressed mood: no  URINARY INCONTINENCE Has been present for 6 months -- worked with PT on pelvic floor exercises.  Is doing [place] and is supposed to drink a lot of water with this.  Has one child, vaginal birth. Has involuntary incontinence often -- tried Kegel exercise with no benefit at home.  Is wearing panty liners, has to change 3-4 times a day.  Reports incontinence just happens, no urgency.   Dysuria: no Urinary frequency: yes Urgency: yes Small volume voids: no Symptom severity: yes Urinary incontinence: yes Foul odor: mild Hematuria: no Abdominal pain: no Back pain: no Suprapubic pain/pressure: no Flank pain: no Fever:  no Vomiting:  no Status: worsening Sexual activity: No sexually active Treatments attempted: Kegel and physical therapy  Relevant past medical, surgical, family and social history reviewed and updated as indicated. Interim medical history since our last visit reviewed. Allergies and medications reviewed and updated.  Review of Systems  Constitutional: Negative for activity change, appetite change, diaphoresis, fatigue and fever.  Respiratory: Negative for cough, chest tightness, shortness of breath and wheezing.   Cardiovascular: Negative for chest pain, palpitations and leg swelling.  Gastrointestinal: Negative.   Endocrine: Negative for cold intolerance and heat intolerance.  Genitourinary: Positive for frequency and urgency. Negative for decreased urine volume, dysuria and vaginal discharge.  Neurological: Negative.   Psychiatric/Behavioral: Negative.     Per HPI unless specifically indicated above     Objective:    BP 135/80   Pulse 66   Temp 98.3 F (36.8 C) (Oral)   Wt 211 lb 6.4 oz (95.9 kg)   LMP 12/15/1991 (Approximate)   SpO2 96%   BMI 35.52 kg/m   Wt Readings from Last 3 Encounters:  01/27/21 211 lb 6.4 oz (95.9 kg)  10/28/20 212 lb 3.2 oz (96.3 kg)  10/03/20 210 lb (95.3 kg)    Physical Exam Vitals and nursing note reviewed.  Constitutional:      General: She is awake. She is not in acute distress.    Appearance: She is well-developed and well-groomed. She is obese. She is not ill-appearing.  HENT:     Head: Normocephalic.  Right Ear: Hearing normal. No drainage.     Left Ear: Hearing normal. No drainage.  Eyes:     General: Lids are normal.        Right eye: No discharge.        Left eye: No discharge.     Conjunctiva/sclera: Conjunctivae normal.     Pupils: Pupils are equal, round, and reactive to light.  Neck:     Thyroid: No thyromegaly.     Vascular: No carotid bruit.  Cardiovascular:     Rate and Rhythm: Normal rate and regular rhythm.     Heart  sounds: Normal heart sounds. No murmur heard. No gallop.   Pulmonary:     Effort: Pulmonary effort is normal. No accessory muscle usage or respiratory distress.     Breath sounds: Normal breath sounds.  Abdominal:     General: Bowel sounds are normal. There is no distension.     Palpations: Abdomen is soft.     Tenderness: There is no abdominal tenderness. There is no right CVA tenderness or left CVA tenderness.  Musculoskeletal:     Cervical back: Normal range of motion and neck supple.     Right lower leg: No edema.     Left lower leg: No edema.  Lymphadenopathy:     Head:     Right side of head: No submental, submandibular, tonsillar, preauricular or posterior auricular adenopathy.     Left side of head: No submental, submandibular, tonsillar, preauricular or posterior auricular adenopathy.     Cervical: No cervical adenopathy.  Skin:    General: Skin is warm and dry.  Neurological:     Mental Status: She is alert and oriented to person, place, and time.     Cranial Nerves: Cranial nerves are intact.     Gait: Gait is intact.     Deep Tendon Reflexes: Reflexes are normal and symmetric.     Reflex Scores:      Brachioradialis reflexes are 2+ on the right side and 2+ on the left side.      Patellar reflexes are 2+ on the right side and 2+ on the left side. Psychiatric:        Attention and Perception: Attention normal.        Mood and Affect: Mood normal.        Speech: Speech normal.        Behavior: Behavior normal. Behavior is cooperative.        Thought Content: Thought content normal.        Judgment: Judgment normal.     Results for orders placed or performed in visit on 10/28/20  WET PREP FOR [place], YEAST, CLUE   Specimen: Sterile Swab   Sterile Swab  Result Value Ref Range   Trichomonas Exam Negative Negative   Yeast Exam Negative Negative   Clue Cell Exam Negative Negative  Microscopic Examination   Urine  Result Value Ref Range   WBC, UA None seen 0 - 5  /hpf   RBC 3-10 (A) 0 - 2 /hpf   Epithelial Cells (non renal) 0-10 0 - 10 /hpf   Bacteria, UA None seen None seen/Few  Urinalysis, Routine w reflex microscopic  Result Value Ref Range   Specific Gravity, UA 1.015 1.005 - 1.030   pH, UA 7.0 5.0 - 7.5   Color, UA Yellow Yellow   Appearance Ur Clear Clear   Leukocytes,UA Negative Negative   Protein,UA Negative Negative/Trace   Glucose, UA Negative Negative  Ketones, UA Negative Negative   RBC, UA Trace (A) Negative   Bilirubin, UA Negative Negative   Urobilinogen, Ur 0.2 0.2 - 1.0 mg/dL   Nitrite, UA Negative Negative   Microscopic Examination See below:   T4, free  Result Value Ref Range   Free T4 2.40 (H) 0.82 - 1.77 ng/dL  TSH  Result Value Ref Range   TSH 0.377 (L) 0.450 - 4.500 uIU/mL  Bayer DCA Hb A1c Waived  Result Value Ref Range   HB A1C (BAYER DCA - WAIVED) 5.0 <4.4 %  Basic metabolic panel  Result Value Ref Range   Glucose 98 65 - 99 mg/dL   BUN 19 8 - 27 mg/dL   Creatinine, Ser 0.87 0.57 - 1.00 mg/dL   GFR calc non Af Amer 67 >59 mL/min/1.73   GFR calc Af Amer 78 >59 mL/min/1.73   BUN/Creatinine Ratio 22 12 - 28   Sodium 138 134 - 144 mmol/L   Potassium 4.3 3.5 - 5.2 mmol/L   Chloride 99 96 - 106 mmol/L   CO2 23 20 - 29 mmol/L   Calcium 9.6 8.7 - 10.3 mg/dL      Assessment & Plan:   Problem List Items Addressed This Visit      Respiratory   Other allergic rhinitis    Chronic, stable.  Refills sent.      Relevant Medications   fluticasone (FLONASE) 50 MCG/ACT nasal spray     Endocrine   Hypothyroidism - Primary    Chronic, ongoing.  Continue current Levothyroxine dose and adjust as needed.  Obtain TSH and Free T4 today.      Relevant Orders   TSH   T4, free   Thyroid peroxidase antibody     Genitourinary   Overactive bladder    Ongoing issue for several months tried PT and minimal benefit with this.  Suspect OAB based on her symptoms and HPI.  Discussed medication options, such as Ditropan  or Myrbetriq -- with preference for Myrbetriq due to safer in patients >65.  Will trial Myrbetriq 25 MG daily, discussed this is safest option in her age group.  Return in 4 weeks for follow-up.  Consider urology referral if ongoing.          Follow up plan: Return in about 4 weeks (around 02/24/2021) for OAB.

## 2021-01-27 NOTE — Assessment & Plan Note (Signed)
Chronic, stable.  Refills sent.

## 2021-01-27 NOTE — Patient Instructions (Signed)
Overactive Bladder, Adult  Overactive bladder is a condition in which a person has a sudden and frequent need to urinate. A person might also leak urine if he or she cannot get to the bathroom fast enough (urinary incontinence). Sometimes, symptoms can interfere with work or social activities. What are the causes? Overactive bladder is associated with poor nerve signals between your bladder and your brain. Your bladder may get the signal to empty before it is full. You may also have very sensitive muscles that make your bladder squeeze too soon. This condition may also be caused by other factors, such as:  Medical conditions: ? Urinary tract infection. ? Infection of nearby tissues. ? Prostate enlargement. ? Bladder stones, inflammation, or tumors. ? Diabetes. ? Muscle or nerve weakness, especially from these conditions:  A spinal cord injury.  Stroke.  Multiple sclerosis.  Parkinson's disease.  Other causes: ? Surgery on the uterus or urethra. ? Drinking too much caffeine or alcohol. ? Certain medicines, especially those that eliminate extra fluid in the body (diuretics). ? Constipation. What increases the risk? You may be at greater risk for overactive bladder if you:  Are an older adult.  Smoke.  Are going through menopause.  Have prostate problems.  Have a neurological disease, such as stroke, dementia, Parkinson's disease, or multiple sclerosis (MS).  Eat or drink alcohol, spicy food, caffeine, and other things that irritate the bladder.  Are overweight or obese. What are the signs or symptoms? Symptoms of this condition include a sudden, strong urge to urinate. Other symptoms include:  Leaking urine.  Urinating 8 or more times a day.  Waking up to urinate 2 or more times overnight. How is this diagnosed? This condition may be diagnosed based on:  Your symptoms and medical history.  A physical exam.  Blood or urine tests to check for possible causes,  such as infection. You may also need to see a health care provider who specializes in urinary tract problems. This is called a urologist. How is this treated? Treatment for overactive bladder depends on the cause of your condition and whether it is mild or severe. Treatment may include:  Bladder training, such as: ? Learning to control the urge to urinate by following a schedule to urinate at regular intervals. ? Doing Kegel exercises to strengthen the pelvic floor muscles that support your bladder.  Special devices, such as: ? Biofeedback. This uses sensors to help you become aware of your body's signals. ? Electrical stimulation. This uses electrodes placed inside the body (implanted) or outside the body. These electrodes send gentle pulses of electricity to strengthen the nerves or muscles that control the bladder. ? Women may use a plastic device, called a pessary, that fits into the vagina and supports the bladder.  Medicines, such as: ? Antibiotics to treat bladder infection. ? Antispasmodics to stop the bladder from releasing urine at the wrong time. ? Tricyclic antidepressants to relax bladder muscles. ? Injections of botulinum toxin type A directly into the bladder tissue to relax bladder muscles.  Surgery, such as: ? A device may be implanted to help manage the nerve signals that control urination. ? An electrode may be implanted to stimulate electrical signals in the bladder. ? A procedure may be done to change the shape of the bladder. This is done only in very severe cases. Follow these instructions at home: Eating and drinking  Make diet or lifestyle changes recommended by your health care provider. These may include: ? Drinking fluids   throughout the day and not only with meals. ? Cutting down on caffeine or alcohol. ? Eating a healthy and balanced diet to prevent constipation. This may include:  Choosing foods that are high in fiber, such as beans, whole grains, and  fresh fruits and vegetables.  Limiting foods that are high in fat and processed sugars, such as fried and sweet foods.   Lifestyle  Lose weight if needed.  Do not use any products that contain nicotine or tobacco. These include cigarettes, chewing tobacco, and vaping devices, such as e-cigarettes. If you need help quitting, ask your health care provider.   General instructions  Take over-the-counter and prescription medicines only as told by your health care provider.  If you were prescribed an antibiotic medicine, take it as told by your health care provider. Do not stop taking the antibiotic even if you start to feel better.  Use any implants or pessary as told by your health care provider.  If needed, wear pads to absorb urine leakage.  Keep a log to track how much and when you drink, and when you need to urinate. This will help your health care provider monitor your condition.  Keep all follow-up visits. This is important. Contact a health care provider if:  You have a fever or chills.  Your symptoms do not get better with treatment.  Your pain and discomfort get worse.  You have more frequent urges to urinate. Get help right away if:  You are not able to control your bladder. Summary  Overactive bladder refers to a condition in which a person has a sudden and frequent need to urinate.  Several conditions may lead to an overactive bladder.  Treatment for overactive bladder depends on the cause and severity of your condition.  Making lifestyle changes, doing Kegel exercises, keeping a log, and taking medicines can help with this condition. This information is not intended to replace advice given to you by your health care provider. Make sure you discuss any questions you have with your health care provider. Document Revised: 05/31/2020 Document Reviewed: 05/31/2020 Elsevier Patient Education  2021 [employer]  

## 2021-01-27 NOTE — Assessment & Plan Note (Signed)
Ongoing issue for several months tried PT and minimal benefit with this.  Suspect OAB based on her symptoms and HPI.  Discussed medication options, such as Ditropan or Myrbetriq -- with preference for Myrbetriq due to safer in patients >65.  Will trial Myrbetriq 25 MG daily, discussed this is safest option in her age group.  Return in 4 weeks for follow-up.  Consider urology referral if ongoing.

## 2021-01-27 NOTE — Assessment & Plan Note (Signed)
Chronic, ongoing.  Continue current Levothyroxine dose and adjust as needed.  Obtain TSH and Free T4 today. 

## 2021-01-28 LAB — TSH: TSH: 2.88 u[IU]/mL (ref 0.450–4.500)

## 2021-01-28 LAB — THYROID PEROXIDASE ANTIBODY: Thyroperoxidase Ab SerPl-aCnc: 10 IU/mL (ref 0–34)

## 2021-01-28 LAB — T4, FREE: Free T4: 1.59 ng/dL (ref 0.82–1.77)

## 2021-01-28 NOTE — Progress Notes (Signed)
Contacted via [place] [facility] [place], your thyroid labs have returned and are now within normal range.  Continue current Levothyroxine dosing.  Any questions? Keep being awesome!!  Thank you for allowing me to participate in your care.  I appreciate you. Kindest regards, Macgregor Aeschliman

## 2021-02-24 ENCOUNTER — Encounter: Payer: Self-pay | Admitting: Nurse Practitioner

## 2021-02-24 ENCOUNTER — Ambulatory Visit: Payer: Medicare PPO | Admitting: Nurse Practitioner

## 2021-02-24 ENCOUNTER — Other Ambulatory Visit: Payer: Self-pay

## 2021-02-24 DIAGNOSIS — N3281 Overactive bladder: Secondary | ICD-10-CM | POA: Diagnosis not present

## 2021-02-24 MED ORDER — MIRABEGRON ER 25 MG PO TB24: 25.0000 mg | ORAL_TABLET | Freq: Every day | ORAL | 12 refills | Status: DC

## 2021-02-24 NOTE — Assessment & Plan Note (Signed)
Improved with medication.  Will continue Myrbetriq 25 MG daily, discussed this is safest option in her age group -- appears 30 day supply with 12 refills will be covered.  Return in 6 months for follow-up.  Consider urology referral if ongoing.

## 2021-02-24 NOTE — Patient Instructions (Signed)
Overactive Bladder, Adult  Overactive bladder is a condition in which a person has a sudden and frequent need to urinate. A person might also leak urine if he or she cannot get to the bathroom fast enough (urinary incontinence). Sometimes, symptoms can interfere with work or social activities. What are the causes? Overactive bladder is associated with poor nerve signals between your bladder and your brain. Your bladder may get the signal to empty before it is full. You may also have very sensitive muscles that make your bladder squeeze too soon. This condition may also be caused by other factors, such as:  Medical conditions: ? Urinary tract infection. ? Infection of nearby tissues. ? Prostate enlargement. ? Bladder stones, inflammation, or tumors. ? Diabetes. ? Muscle or nerve weakness, especially from these conditions:  A spinal cord injury.  Stroke.  Multiple sclerosis.  Parkinson's disease.  Other causes: ? Surgery on the uterus or urethra. ? Drinking too much caffeine or alcohol. ? Certain medicines, especially those that eliminate extra fluid in the body (diuretics). ? Constipation. What increases the risk? You may be at greater risk for overactive bladder if you:  Are an older adult.  Smoke.  Are going through menopause.  Have prostate problems.  Have a neurological disease, such as stroke, dementia, Parkinson's disease, or multiple sclerosis (MS).  Eat or drink alcohol, spicy food, caffeine, and other things that irritate the bladder.  Are overweight or obese. What are the signs or symptoms? Symptoms of this condition include a sudden, strong urge to urinate. Other symptoms include:  Leaking urine.  Urinating 8 or more times a day.  Waking up to urinate 2 or more times overnight. How is this diagnosed? This condition may be diagnosed based on:  Your symptoms and medical history.  A physical exam.  Blood or urine tests to check for possible causes,  such as infection. You may also need to see a health care provider who specializes in urinary tract problems. This is called a urologist. How is this treated? Treatment for overactive bladder depends on the cause of your condition and whether it is mild or severe. Treatment may include:  Bladder training, such as: ? Learning to control the urge to urinate by following a schedule to urinate at regular intervals. ? Doing Kegel exercises to strengthen the pelvic floor muscles that support your bladder.  Special devices, such as: ? Biofeedback. This uses sensors to help you become aware of your body's signals. ? Electrical stimulation. This uses electrodes placed inside the body (implanted) or outside the body. These electrodes send gentle pulses of electricity to strengthen the nerves or muscles that control the bladder. ? Women may use a plastic device, called a pessary, that fits into the vagina and supports the bladder.  Medicines, such as: ? Antibiotics to treat bladder infection. ? Antispasmodics to stop the bladder from releasing urine at the wrong time. ? Tricyclic antidepressants to relax bladder muscles. ? Injections of botulinum toxin type A directly into the bladder tissue to relax bladder muscles.  Surgery, such as: ? A device may be implanted to help manage the nerve signals that control urination. ? An electrode may be implanted to stimulate electrical signals in the bladder. ? A procedure may be done to change the shape of the bladder. This is done only in very severe cases. Follow these instructions at home: Eating and drinking  Make diet or lifestyle changes recommended by your health care provider. These may include: ? Drinking fluids   throughout the day and not only with meals. ? Cutting down on caffeine or alcohol. ? Eating a healthy and balanced diet to prevent constipation. This may include:  Choosing foods that are high in fiber, such as beans, whole grains, and  fresh fruits and vegetables.  Limiting foods that are high in fat and processed sugars, such as fried and sweet foods.   Lifestyle  Lose weight if needed.  Do not use any products that contain nicotine or tobacco. These include cigarettes, chewing tobacco, and vaping devices, such as e-cigarettes. If you need help quitting, ask your health care provider.   General instructions  Take over-the-counter and prescription medicines only as told by your health care provider.  If you were prescribed an antibiotic medicine, take it as told by your health care provider. Do not stop taking the antibiotic even if you start to feel better.  Use any implants or pessary as told by your health care provider.  If needed, wear pads to absorb urine leakage.  Keep a log to track how much and when you drink, and when you need to urinate. This will help your health care provider monitor your condition.  Keep all follow-up visits. This is important. Contact a health care provider if:  You have a fever or chills.  Your symptoms do not get better with treatment.  Your pain and discomfort get worse.  You have more frequent urges to urinate. Get help right away if:  You are not able to control your bladder. Summary  Overactive bladder refers to a condition in which a person has a sudden and frequent need to urinate.  Several conditions may lead to an overactive bladder.  Treatment for overactive bladder depends on the cause and severity of your condition.  Making lifestyle changes, doing Kegel exercises, keeping a log, and taking medicines can help with this condition. This information is not intended to replace advice given to you by your health care provider. Make sure you discuss any questions you have with your health care provider. Document Revised: 05/31/2020 Document Reviewed: 05/31/2020 Elsevier Patient Education  2021 [employer]  

## 2021-02-24 NOTE — Progress Notes (Signed)
BP 111/73   Pulse 76   Temp 98.4 F (36.9 C) (Oral)   Wt 215 lb 12.8 oz (97.9 kg)   LMP 12/15/1991 (Approximate)   SpO2 100%   BMI 36.26 kg/m    Subjective:    Patient ID: Tiffany Delacruz, female    DOB: 02-20-1951, 70 y.o.   MRN: [id number]  HPI: Tiffany Delacruz is a 70 y.o. female  Chief Complaint  Patient presents with  . OAB    Patient states her symptoms have gotten better.    URINARY INCONTINENCE Follow-up for OAB.  Mybetriq 25 MG sent in on 01/27/21.  Symptoms have been present for 6 months -- worked with PT on pelvic floor exercises.  Reports symptoms are improving.  Is doing [place] and is supposed to drink a lot of water with this.  Has one child, vaginal birth.  Is wearing panty liners, now changing only twice a day, previous 3-4.   Dysuria: no Urinary frequency: a little better Urgency: none Small volume voids: no Symptom severity: yes Urinary incontinence: 2 episodes a day, small dribble Foul odor: mild Hematuria: no Abdominal pain: no Back pain: no Suprapubic pain/pressure: no Flank pain: no Fever:  no Vomiting: no Status: worsening Sexual activity: No sexually active Treatments attempted: Kegel and physical therapy, Myrbetriq  Relevant past medical, surgical, family and social history reviewed and updated as indicated. Interim medical history since our last visit reviewed. Allergies and medications reviewed and updated.  Review of Systems  Constitutional: Negative for activity change, appetite change, diaphoresis, fatigue and fever.  Respiratory: Negative for cough, chest tightness, shortness of breath and wheezing.   Cardiovascular: Negative for chest pain, palpitations and leg swelling.  Gastrointestinal: Negative.   Endocrine: Negative for cold intolerance and heat intolerance.  Genitourinary: Positive for frequency and urgency. Negative for decreased urine volume, dysuria and vaginal discharge.  Neurological: Negative.    Psychiatric/Behavioral: Negative.     Per HPI unless specifically indicated above     Objective:    BP 111/73   Pulse 76   Temp 98.4 F (36.9 C) (Oral)   Wt 215 lb 12.8 oz (97.9 kg)   LMP 12/15/1991 (Approximate)   SpO2 100%   BMI 36.26 kg/m   Wt Readings from Last 3 Encounters:  02/24/21 215 lb 12.8 oz (97.9 kg)  01/27/21 211 lb 6.4 oz (95.9 kg)  10/28/20 212 lb 3.2 oz (96.3 kg)    Physical Exam Vitals and nursing note reviewed.  Constitutional:      General: She is awake. She is not in acute distress.    Appearance: She is well-developed and well-groomed. She is obese. She is not ill-appearing.  HENT:     Head: Normocephalic.     Right Ear: Hearing normal. No drainage.     Left Ear: Hearing normal. No drainage.  Eyes:     General: Lids are normal.        Right eye: No discharge.        Left eye: No discharge.     Conjunctiva/sclera: Conjunctivae normal.     Pupils: Pupils are equal, round, and reactive to light.  Neck:     Thyroid: No thyromegaly.     Vascular: No carotid bruit.  Cardiovascular:     Rate and Rhythm: Normal rate and regular rhythm.     Heart sounds: Normal heart sounds. No murmur heard. No gallop.   Pulmonary:     Effort: Pulmonary effort is normal. No accessory muscle usage or  respiratory distress.     Breath sounds: Normal breath sounds.  Abdominal:     General: Bowel sounds are normal. There is no distension.     Palpations: Abdomen is soft.     Tenderness: There is no abdominal tenderness. There is no right CVA tenderness or left CVA tenderness.  Musculoskeletal:     Cervical back: Normal range of motion and neck supple.     Right lower leg: No edema.     Left lower leg: No edema.  Lymphadenopathy:     Cervical: No cervical adenopathy.  Skin:    General: Skin is warm and dry.  Neurological:     Mental Status: She is alert and oriented to person, place, and time.     Deep Tendon Reflexes: Reflexes are normal and symmetric.      Reflex Scores:      Brachioradialis reflexes are 2+ on the right side and 2+ on the left side.      Patellar reflexes are 2+ on the right side and 2+ on the left side. Psychiatric:        Attention and Perception: Attention normal.        Mood and Affect: Mood normal.        Speech: Speech normal.        Behavior: Behavior normal. Behavior is cooperative.        Thought Content: Thought content normal.        Judgment: Judgment normal.    Results for orders placed or performed in visit on 01/27/21  TSH  Result Value Ref Range   TSH 2.880 0.450 - 4.500 uIU/mL  T4, free  Result Value Ref Range   Free T4 1.59 0.82 - 1.77 ng/dL  Thyroid peroxidase antibody  Result Value Ref Range   Thyroperoxidase Ab SerPl-aCnc 10 0 - 34 IU/mL      Assessment & Plan:   Problem List Items Addressed This Visit      Genitourinary   Overactive bladder    Improved with medication.  Will continue Myrbetriq 25 MG daily, discussed this is safest option in her age group -- appears 30 day supply with 12 refills will be covered.  Return in 6 months for follow-up.  Consider urology referral if ongoing.          Follow up plan: Return in about 6 months (around 08/26/2021) for HTN/HL, DEPRESSION, OAB, THYROID.

## 2021-02-25 IMAGING — US US BREAST*R* LIMITED INC AXILLA
2 series · 4 of 4 positions shown · non-contrast
Comparison: 12/19/2011 from [HOSPITAL] [HOSPITAL]

CLINICAL DATA: The patient presents for evaluation of a palpable
mass in the RIGHT breast noted on physical exam 1.5 years ago. This
is the patient's first evaluation for this finding. The patient
denies any history of breast biopsy.

EXAM:
DIGITAL DIAGNOSTIC BILATERAL MAMMOGRAM WITH CAD AND TOMO
ULTRASOUND BILATERAL BREAST

[Series 1: us breast*right* limited inc axilla · 0.08mm/px · 2 of 2 slices shown (1 of 2)]
[im 1/2]
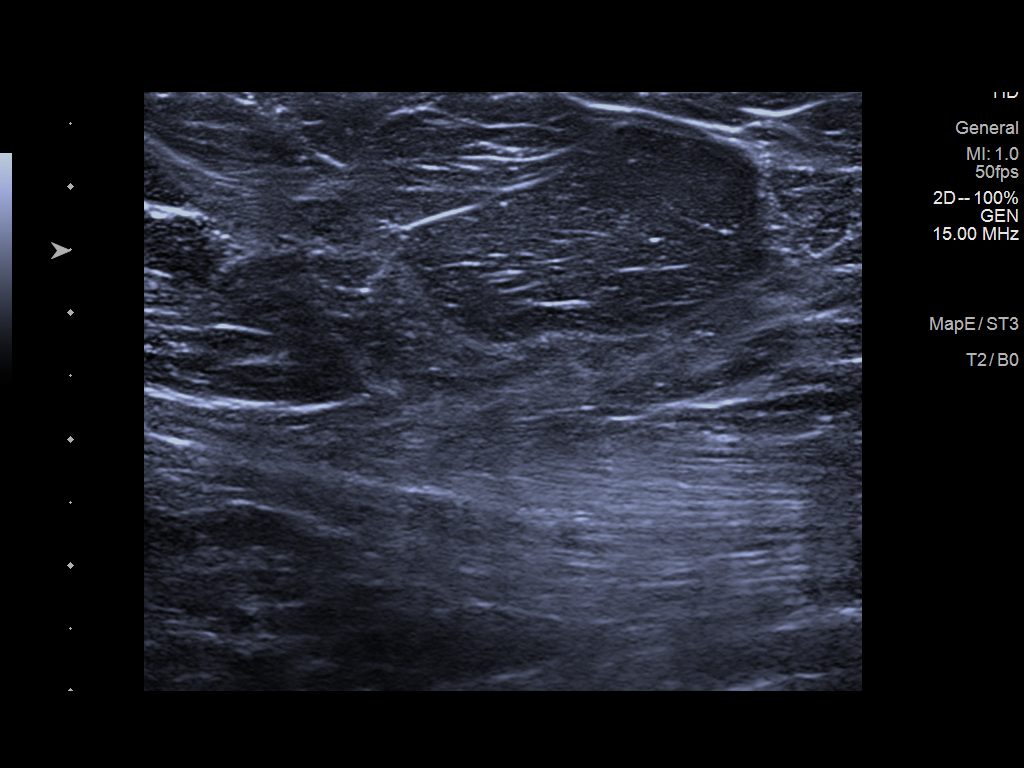
[im 2/2]
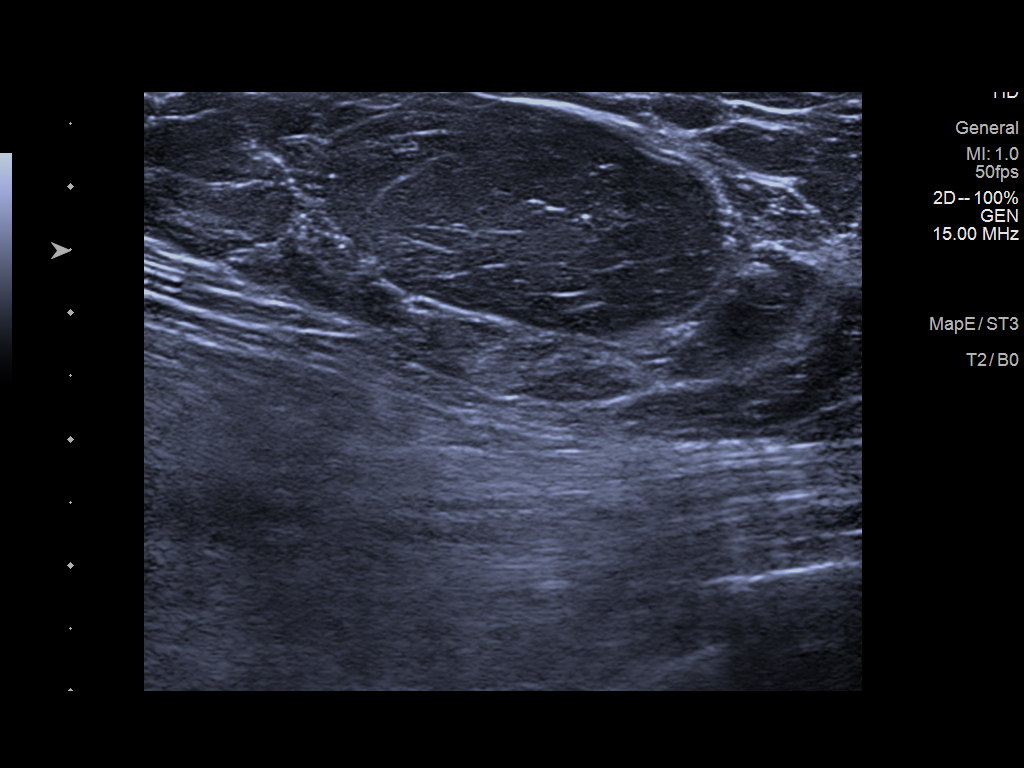

[Series 2: us breast*right* limited inc axilla · 0.07mm/px · 2 of 2 slices shown (2 of 2)]
[im 1/2]
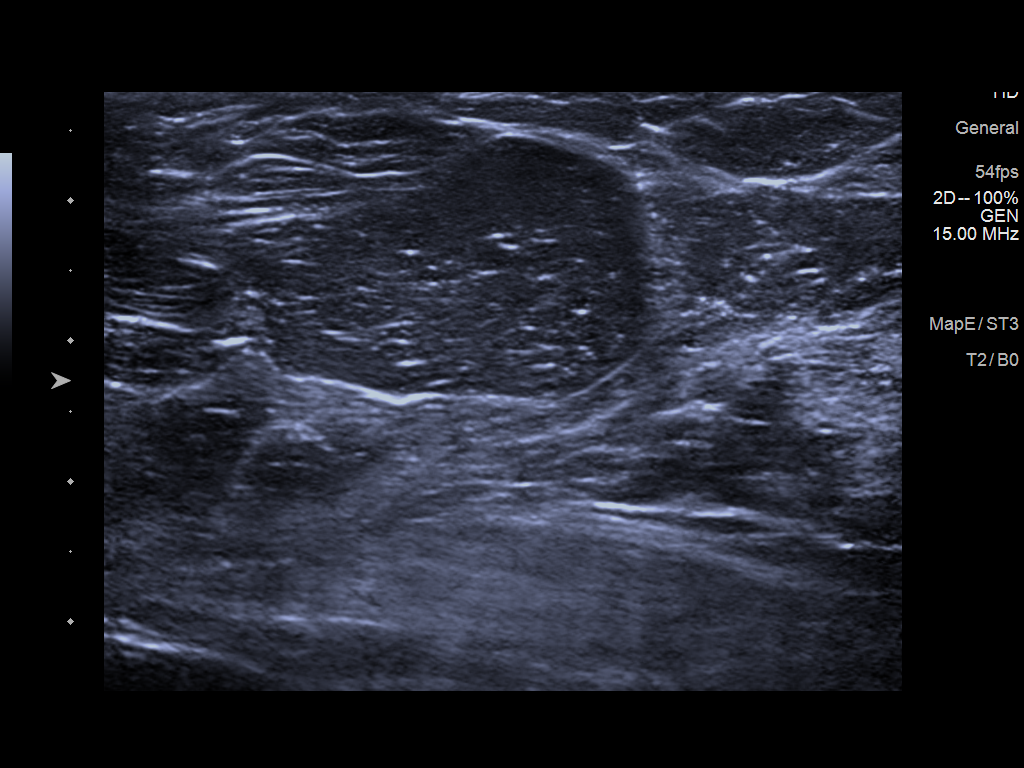
[im 2/2]
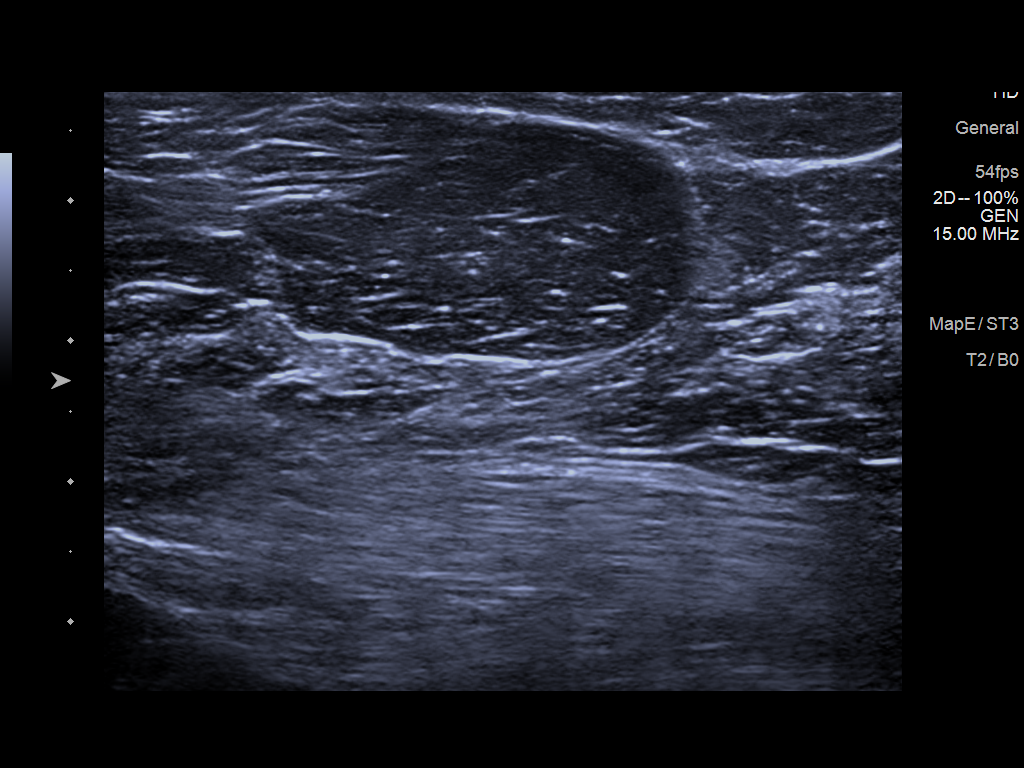

[4 of 4 positions shown; findings below may reference images not displayed]

ACR Breast Density Category c: The breast tissue is heterogeneously
dense, which may obscure small masses.
FINDINGS: Tissue marker clip is identified in the UPPER OUTER QUADRANT of the
RIGHT breast. BB marks the area of patient's concern in the RIGHT
breast. A spot tangential view in this region shows a fatty mass
with discrete capsule.

A circumscribed oval mass is identified in the UPPER-OUTER QUADRANT
of the LEFT breast and further evaluated with ultrasound.

Mammographic images were processed with CAD.

On physical exam, I palpate a soft discrete mass in the 10 o'clock
location of the RIGHT breast in the area concern to the patient.

Targeted ultrasound is performed, showing an oval circumscribed
hypoechoic mass in the 10 o'clock location of the RIGHT breast which
measures 2.8 x 2.0 centimeters. Mass is compressible on real-time
evaluation.

Targeted ultrasound of the UPPER-OUTER QUADRANT of the LEFT breast
demonstrates a simple cyst in the 8 3 o'clock location 7 centimeters
from the nipple which measures 1.2 x 1.4 x 1.1 centimeters. No solid
component or areas of acoustic shadowing.
IMPRESSION: 1. Benign lipoma in the 10 o'clock location of the RIGHT breast.
2. Benign cyst in the 3 o'clock location of the LEFT breast.

RECOMMENDATION:
Screening mammogram in one year.(Code:11-T-VVS)

I have discussed the findings and recommendations with the patient.
If applicable, a reminder letter will be sent to the patient
regarding the next appointment.

BI-RADS CATEGORY  1: Negative.

## 2021-02-25 IMAGING — US US BREAST*L* LIMITED INC AXILLA
1 series · 5 of 5 positions shown · non-contrast
Comparison: 12/19/2011 from [HOSPITAL] [HOSPITAL]

CLINICAL DATA: The patient presents for evaluation of a palpable
mass in the RIGHT breast noted on physical exam 1.5 years ago. This
is the patient's first evaluation for this finding. The patient
denies any history of breast biopsy.

EXAM:
DIGITAL DIAGNOSTIC BILATERAL MAMMOGRAM WITH CAD AND TOMO
ULTRASOUND BILATERAL BREAST

[Series 1: us breast*left* limited inc axilla · 0.07mm/px · 5 of 5 slices shown]
[im 1/5]
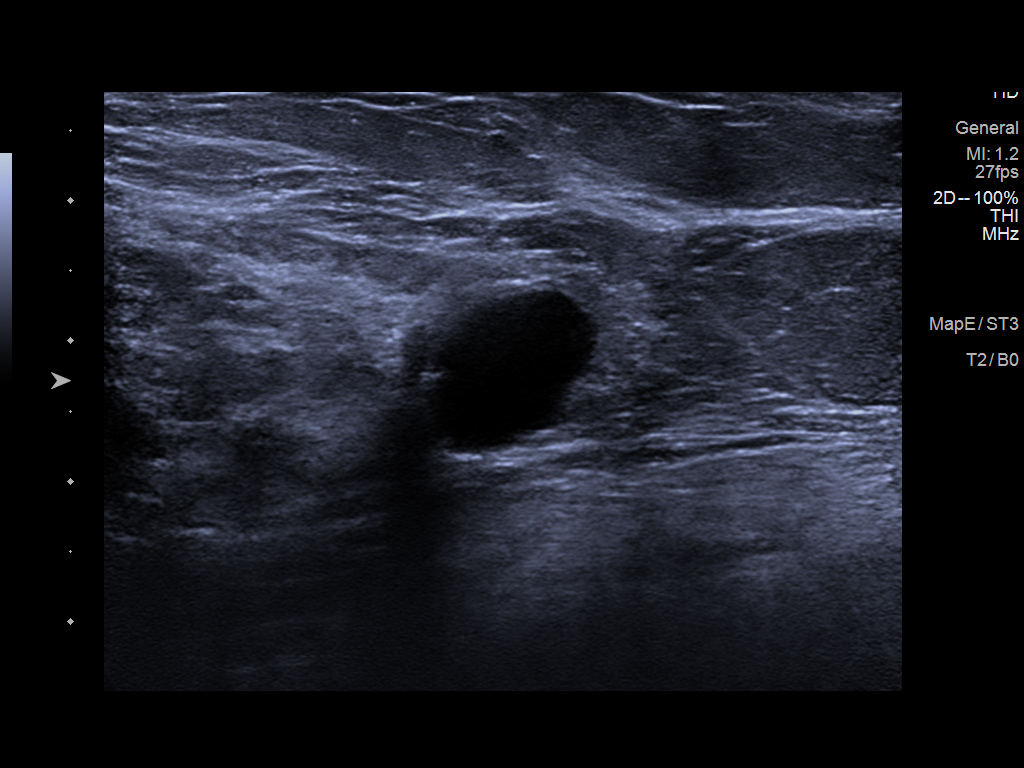
[im 2/5]
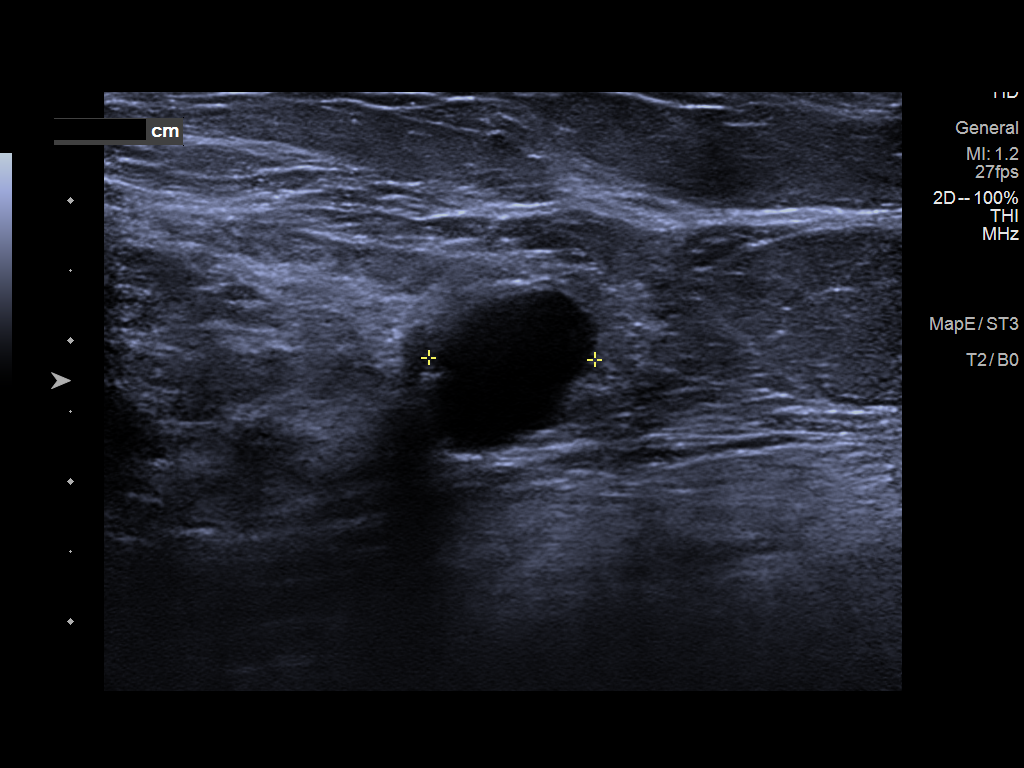
[im 3/5]
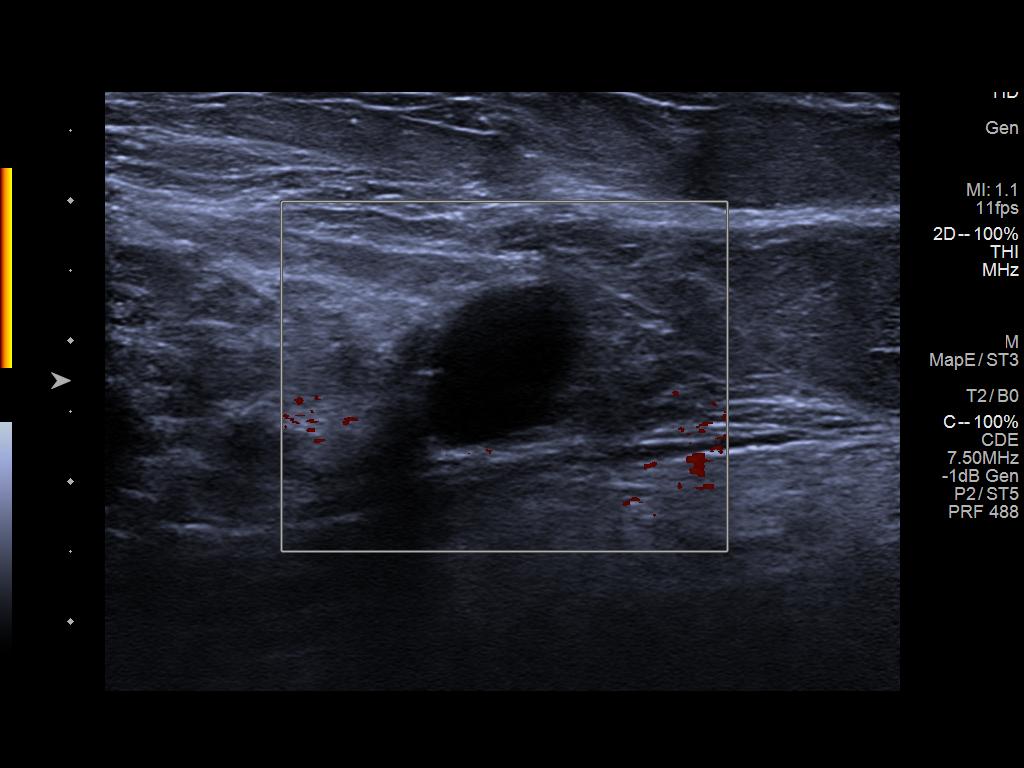
[im 4/5]
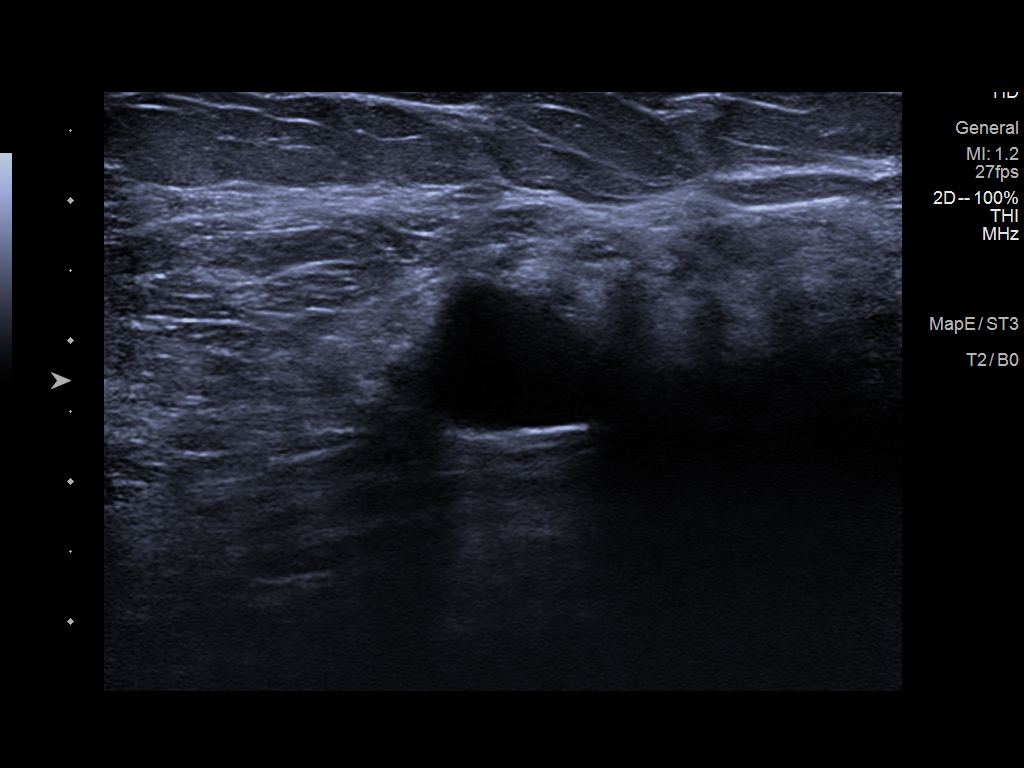
[im 5/5]
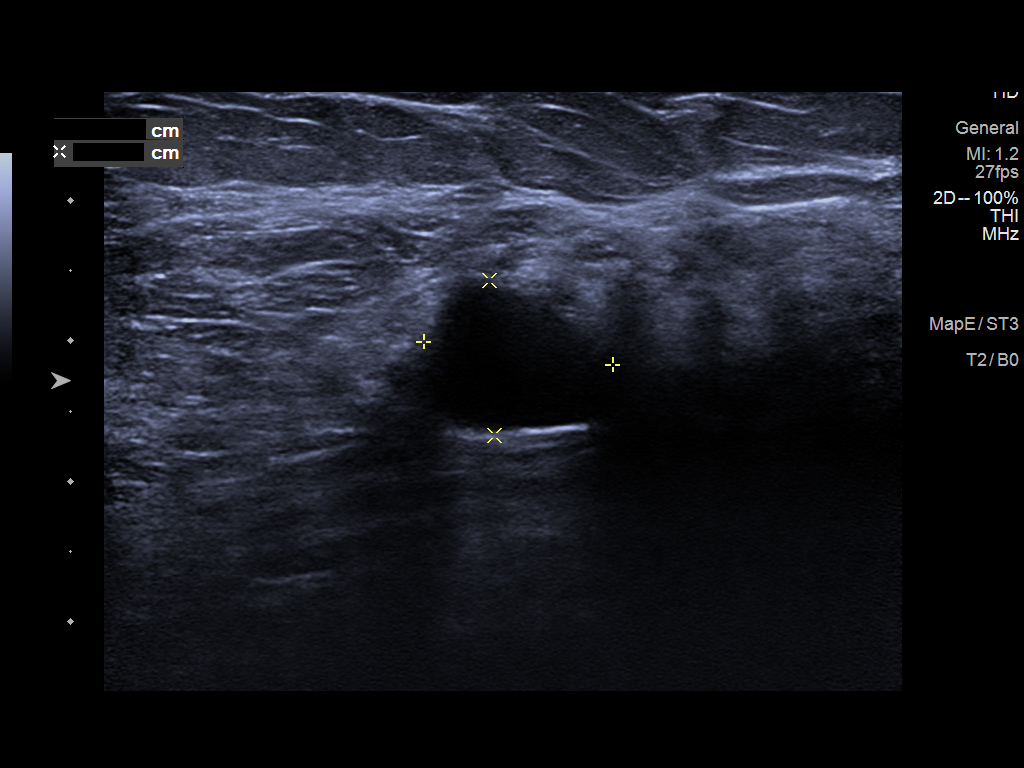

[5 of 5 positions shown; findings below may reference images not displayed]

ACR Breast Density Category c: The breast tissue is heterogeneously
dense, which may obscure small masses.
FINDINGS: Tissue marker clip is identified in the UPPER OUTER QUADRANT of the
RIGHT breast. BB marks the area of patient's concern in the RIGHT
breast. A spot tangential view in this region shows a fatty mass
with discrete capsule.

A circumscribed oval mass is identified in the UPPER-OUTER QUADRANT
of the LEFT breast and further evaluated with ultrasound.

Mammographic images were processed with CAD.

On physical exam, I palpate a soft discrete mass in the 10 o'clock
location of the RIGHT breast in the area concern to the patient.

Targeted ultrasound is performed, showing an oval circumscribed
hypoechoic mass in the 10 o'clock location of the RIGHT breast which
measures 2.8 x 2.0 centimeters. Mass is compressible on real-time
evaluation.

Targeted ultrasound of the UPPER-OUTER QUADRANT of the LEFT breast
demonstrates a simple cyst in the 8 3 o'clock location 7 centimeters
from the nipple which measures 1.2 x 1.4 x 1.1 centimeters. No solid
component or areas of acoustic shadowing.
IMPRESSION: 1. Benign lipoma in the 10 o'clock location of the RIGHT breast.
2. Benign cyst in the 3 o'clock location of the LEFT breast.

RECOMMENDATION:
Screening mammogram in one year.(Code:11-T-VVS)

I have discussed the findings and recommendations with the patient.
If applicable, a reminder letter will be sent to the patient
regarding the next appointment.

BI-RADS CATEGORY  1: Negative.

## 2021-03-07 IMAGING — CT CT HEART MORP W/ CTA COR W/ SCORE W/ CA W/CM &/OR W/O CM
2 of 11 series · 7 of 20 positions shown, 8 images · non-contrast
Comparison: Chest radiograph 08/05/2018.  Chest CT 06/23/2012

Addendum:
CLINICAL DATA: Hx of htn and chestpain

EXAM:
Cardiac/Coronary  CTA
TECHNIQUE: The patient was scanned on a Siemens Somatoform go.Top scanner.

[Series 15: multiphase % cta coronary 0.60 · axial · 0.46mm/px · z∈[-1450,-1381]mm · 3 of 3206 slices shown]
[im 802/3206  vessel]
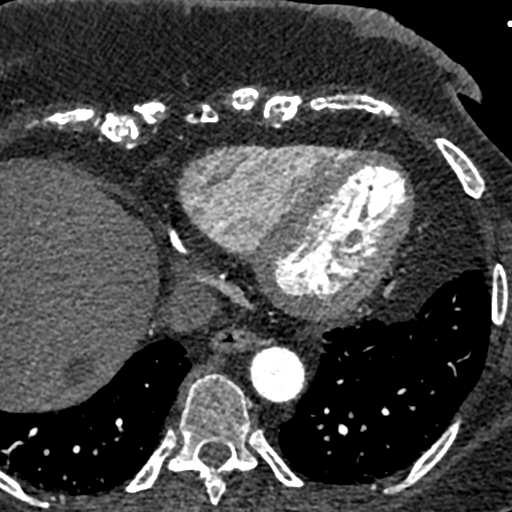
[im 1603/3206  vessel]
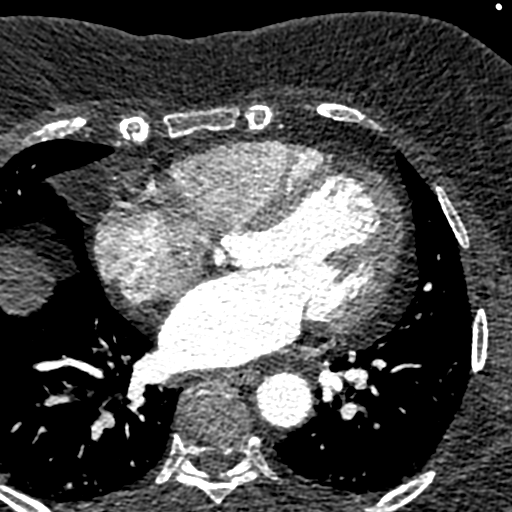
[im 2404/3206  vessel]
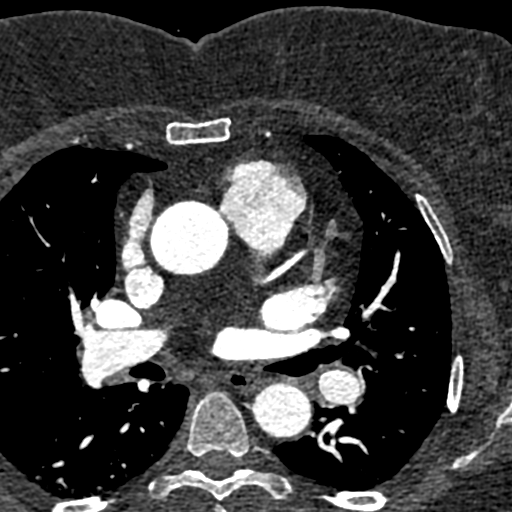

[Series 40: ms multiphase cta coronary 0.60 · axial · 0.46mm/px · z∈[-1457,-1374]mm · 4 of 3206 slices shown, 5 images]
[im 642/3206  vessel]
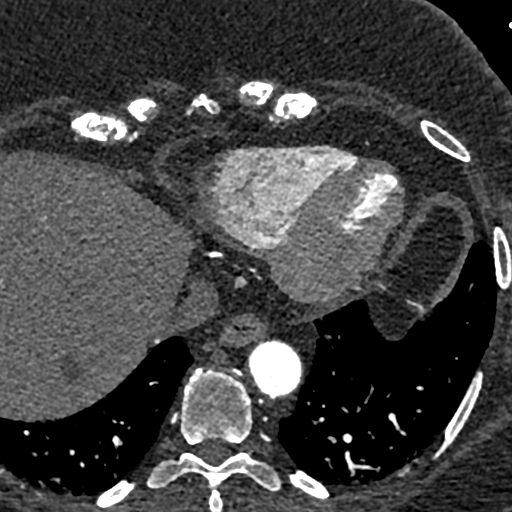
[im 642/3206  lung]
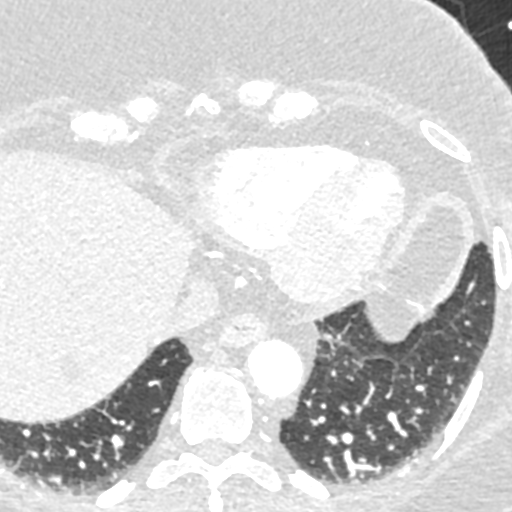
[im 1283/3206  vessel]
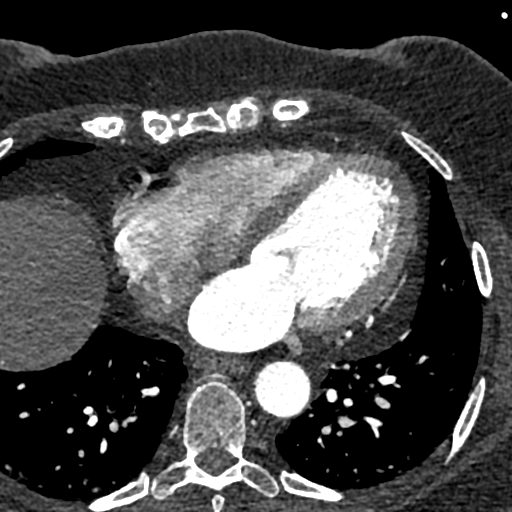
[im 1924/3206  vessel]
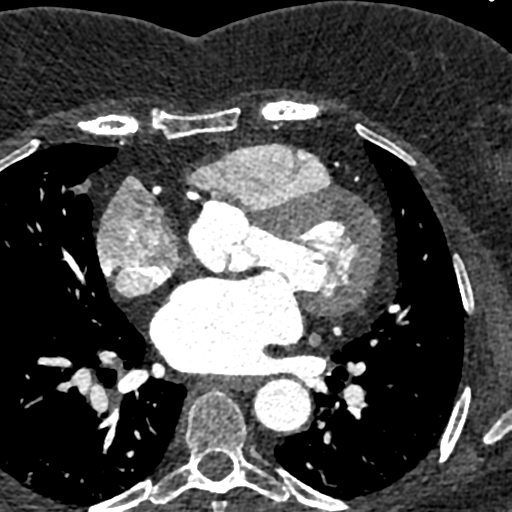
[im 2565/3206  vessel]
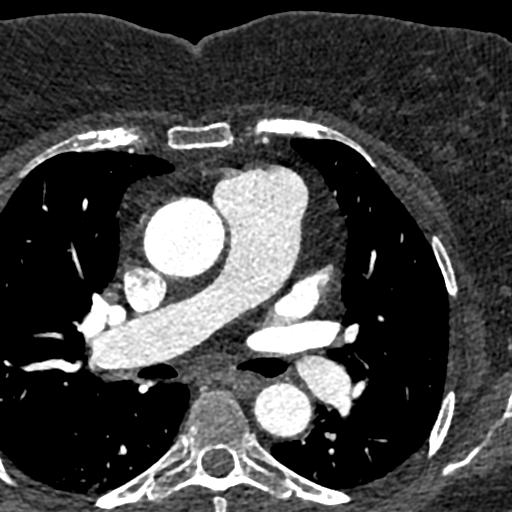

[7 of 20 positions shown; findings below may reference images not displayed]

FINDINGS: A retrospective scan was triggered in the descending thoracic aorta.
Axial non-contrast 3 mm slices were carried out through the heart.
The data set was analyzed on a dedicated work station and scored
using the Agatson method. Gantry rotation speed was 330 msecs and
collimation was .6 mm. 12.5mg of metoprolol and 0.8 mg of sl NTG was
given. The 3D data set was reconstructed in 5% intervals of the
60-95 % of the R-R cycle. Diastolic phases were analyzed on a
dedicated work station using MPR, MIP and VRT modes. The patient
received 125 cc of contrast.

Aorta:  Normal size.  No calcifications.  No dissection.

Aortic Valve:  Trileaflet.  No calcifications.

Coronary Arteries:  Normal coronary origin.  Right dominance.

RCA is a large dominant artery that gives rise to PDA and PLA. There
is no plaque.

Left main is a large artery that gives rise to LAD and LCX arteries.

LAD is a large vessel that has no plaque.

LCX is a non-dominant artery that gives rise to one large OM1
branch. There is no plaque.

Other findings:

Normal pulmonary vein drainage into the left atrium.

Normal left atrial appendage without a thrombus.

Normal size of the pulmonary artery.
IMPRESSION: 1. Coronary calcium score of 0.

2. Normal coronary origin with right dominance.

3. No evidence of CAD.

4. CAD-RADS 0. No evidence of CAD (0%). Consider non-atherosclerotic
causes of chest pain.

ADDENDUM:
OVER-READ INTERPRETATION  CT CHEST

The following report is an over-read performed by radiologist Dr.
This over-read does not include interpretation of cardiac or
coronary anatomy or pathology. The coronary CTA interpretation by
the cardiologist is attached.
FINDINGS: Vascular: Aortic atherosclerosis. No aortic dissection or aneurysm.
No central pulmonary embolism, on this non-dedicated study.

Mediastinum/Nodes: No imaged mediastinal adenopathy. Upper normal
sized right infrahilar nodes are likely reactive.

Lungs/Pleura: No imaged pleural fluid.  Lingular scarring.

Clear imaged lungs.

Upper Abdomen: Well-circumscribed low-density liver lesions are
likely cysts. Segment 2 low-density liver lesion is too small to
characterize but vaguely present back in 9411. Normal imaged
portions of the spleen, stomach.

Musculoskeletal: No acute osseous abnormality. Mild right
hemidiaphragm elevation.
IMPRESSION: 1.  No acute findings in the imaged extracardiac chest.
2.  Aortic Atherosclerosis ([id number]).

*** End of Addendum ***
FINDINGS: A retrospective scan was triggered in the descending thoracic aorta.
Axial non-contrast 3 mm slices were carried out through the heart.
The data set was analyzed on a dedicated work station and scored
using the Agatson method. Gantry rotation speed was 330 msecs and
collimation was .6 mm. 12.5mg of metoprolol and 0.8 mg of sl NTG was
given. The 3D data set was reconstructed in 5% intervals of the
60-95 % of the R-R cycle. Diastolic phases were analyzed on a
dedicated work station using MPR, MIP and VRT modes. The patient
received 125 cc of contrast.

Aorta:  Normal size.  No calcifications.  No dissection.

Aortic Valve:  Trileaflet.  No calcifications.

Coronary Arteries:  Normal coronary origin.  Right dominance.

RCA is a large dominant artery that gives rise to PDA and PLA. There
is no plaque.

Left main is a large artery that gives rise to LAD and LCX arteries.

LAD is a large vessel that has no plaque.

LCX is a non-dominant artery that gives rise to one large OM1
branch. There is no plaque.

Other findings:

Normal pulmonary vein drainage into the left atrium.

Normal left atrial appendage without a thrombus.

Normal size of the pulmonary artery.
IMPRESSION: 1. Coronary calcium score of 0.

2. Normal coronary origin with right dominance.

3. No evidence of CAD.

4. CAD-RADS 0. No evidence of CAD (0%). Consider non-atherosclerotic
causes of chest pain.

## 2021-07-25 ENCOUNTER — Other Ambulatory Visit: Payer: Self-pay

## 2021-07-25 ENCOUNTER — Ambulatory Visit: Payer: Medicare PPO | Admitting: Nurse Practitioner

## 2021-07-25 ENCOUNTER — Encounter: Payer: Self-pay | Admitting: Nurse Practitioner

## 2021-07-25 VITALS — BP 113/74 | HR 84 | Temp 98.6°F | Wt 218.2 lb

## 2021-07-25 DIAGNOSIS — R051 Acute cough: Secondary | ICD-10-CM | POA: Diagnosis not present

## 2021-07-25 DIAGNOSIS — J069 Acute upper respiratory infection, unspecified: Secondary | ICD-10-CM | POA: Diagnosis not present

## 2021-07-25 LAB — VERITOR FLU A/B WAIVED
Influenza A: NEGATIVE
Influenza B: NEGATIVE

## 2021-07-25 MED ORDER — PREDNISONE 10 MG PO TABS: ORAL_TABLET | ORAL | 0 refills | Status: DC

## 2021-07-25 MED ORDER — BENZONATATE 100 MG PO CAPS: 100.0000 mg | ORAL_CAPSULE | Freq: Three times a day (TID) | ORAL | 0 refills | Status: DC | PRN

## 2021-07-25 NOTE — Progress Notes (Addendum)
Acute Office Visit  Subjective:    Patient ID: Tiffany Delacruz, female    DOB: 1950/10/18, 70 y.o.   MRN: [id number]  Chief Complaint  Patient presents with   URI    Pt states she has had a cough, congestion, headache, and chills since Saturday.     HPI Patient is in today for watery eyes, cough, and headache for 2 days.   UPPER RESPIRATORY TRACT INFECTION  Worst symptom: cough Fever: no Cough: yes Shortness of breath: no Wheezing: no Chest pain: no Chest tightness: no Chest congestion: no Nasal congestion: yes Runny nose: no Post nasal drip: yes Sneezing: no Sore throat: yes Swollen glands: no Sinus pressure: yes Headache: yes Face pain: no Toothache: no Ear pain: no  Ear pressure: yes bilateral Eyes red/itching:yes Eye drainage/crusting: yes  Vomiting: no Rash: no Fatigue: yes Sick contacts: yes - last week (negative for covid-19)  Strep contacts: no  Context: stable Recurrent sinusitis: no Relief with OTC cold/cough medications: yes  Treatments attempted: cold/sinus and anti-histamine, vapor mist, flonase    Past Medical History:  Diagnosis Date   Anxiety    Depression    GERD (gastroesophageal reflux disease)    Graves disease    Hyperlipidemia    Hypertension    Hypothyroidism    Obesity    Osteoarthritis    Sleep apnea    NO CPAP   Wheezing 07/14/2018    Past Surgical History:  Procedure Laterality Date   BACK SURGERY  1988   BREAST BIOPSY Right 2017   benign, with marker   CARPAL TUNNEL RELEASE Left 07/20/2017   Procedure: CARPAL TUNNEL RELEASE;  Surgeon: Dereck Leep, MD;  Location: [facility];  Service: Orthopedics;  Laterality: Left;   cataracts surgery     COLONOSCOPY     disk repair     EYE SURGERY Bilateral 10/10/2018   JOINT REPLACEMENT Right 05/2013   knee replacement    Family History  Problem Relation Age of Onset   Uterine cancer Mother    Colon cancer Mother    Heart disease Father    Alcohol abuse Father     Heart attack Father 34   Rheum arthritis Sister    Diabetes Maternal Grandmother    Rheum arthritis Brother     Social History   Socioeconomic History   Marital status: Widowed    Spouse name: Not on file   Number of children: Not on file   Years of education: Not on file   Highest education level: Associate degree: academic program  Occupational History   Occupation: retired  Tobacco Use   Smoking status: Never   Smokeless tobacco: Never  Vaping Use   Vaping Use: Never used  Substance and Sexual Activity   Alcohol use: No    Alcohol/week: 0.0 standard drinks   Drug use: No   Sexual activity: Never  Other Topics Concern   Not on file  Social History Narrative   Not on file   Social Determinants of Health   Financial Resource Strain: Not on file  Food Insecurity: Not on file  Transportation Needs: Not on file  Physical Activity: Not on file  Stress: Not on file  Social Connections: Not on file  Intimate Partner Violence: Not on file    Outpatient Medications Prior to Visit  Medication Sig Dispense Refill   albuterol (VENTOLIN HFA) 108 (90 Base) MCG/ACT inhaler TAKE 2 PUFFS BY MOUTH EVERY 6 HOURS AS NEEDED FOR WHEEZE OR SHORTNESS OF  BREATH 17 g 3   b complex vitamins tablet Take 1 tablet by mouth daily.     famotidine (PEPCID) 20 MG tablet Take 20 mg by mouth daily after lunch.      fluticasone (FLONASE) 50 MCG/ACT nasal spray SPRAY 2 SPRAYS INTO EACH NOSTRIL EVERY DAY 48 mL 3   levothyroxine (SYNTHROID) 125 MCG tablet Take 1 tablet (125 mcg total) by mouth daily. STOP 150 MCG DOSING. 90 tablet 3   losartan (COZAAR) 100 MG tablet Take 1 tablet (100 mg total) by mouth daily. 90 tablet 4   mirabegron ER (MYRBETRIQ) 25 MG TB24 tablet Take 1 tablet (25 mg total) by mouth daily. 30 tablet 12   Multiple Vitamins-Minerals (ZINC PO) Take by mouth daily.     nitroGLYCERIN (NITROSTAT) 0.4 MG SL tablet Place 1 tablet (0.4 mg total) under the tongue every 5 (five) minutes as  needed for chest pain (maximum of 3 doses.). 25 tablet 2   No facility-administered medications prior to visit.    No Known Allergies  Review of Systems  Constitutional:  Positive for fatigue. Negative for fever.  HENT:  Positive for congestion, ear pain (fullness/ringing), postnasal drip, sinus pressure and sore throat. Negative for rhinorrhea, sinus pain and sneezing.   Eyes:  Positive for discharge (watery), redness and itching.  Respiratory:  Positive for cough. Negative for shortness of breath.   Cardiovascular: Negative.   Gastrointestinal:  Positive for nausea. Negative for abdominal pain, constipation, diarrhea and vomiting.  Genitourinary: Negative.   Musculoskeletal: Negative.   Skin: Negative.   Neurological:  Positive for headaches. Negative for dizziness.      Objective:    Physical Exam Vitals and nursing note reviewed.  Constitutional:      General: She is not in acute distress.    Appearance: Normal appearance.  HENT:     Head: Normocephalic.     Right Ear: Ear canal and external ear normal. There is impacted cerumen.     Left Ear: Tympanic membrane, ear canal and external ear normal.  Eyes:     Conjunctiva/sclera: Conjunctivae normal.  Cardiovascular:     Rate and Rhythm: Normal rate and regular rhythm.     Pulses: Normal pulses.     Heart sounds: Normal heart sounds.  Pulmonary:     Effort: Pulmonary effort is normal.     Breath sounds: Normal breath sounds.  Abdominal:     Palpations: Abdomen is soft.     Tenderness: There is no abdominal tenderness.  Musculoskeletal:     Cervical back: Normal range of motion and neck supple. No tenderness.  Lymphadenopathy:     Cervical: No cervical adenopathy.  Skin:    General: Skin is warm.  Neurological:     General: No focal deficit present.     Mental Status: She is alert and oriented to person, place, and time.  Psychiatric:        Mood and Affect: Mood normal.        Behavior: Behavior normal.         Thought Content: Thought content normal.        Judgment: Judgment normal.    BP 113/74   Pulse 84   Temp 98.6 F (37 C) (Oral)   Wt 218 lb 3.2 oz (99 kg)   LMP 12/15/1991 (Approximate)   SpO2 97%   BMI 36.66 kg/m  Wt Readings from Last 3 Encounters:  07/25/21 218 lb 3.2 oz (99 kg)  02/24/21 215 lb 12.8 oz (  97.9 kg)  01/27/21 211 lb 6.4 oz (95.9 kg)    Health Maintenance Due  Topic Date Due   Zoster Vaccines- Shingrix (1 of 2) Never done   COVID-19 Vaccine (3 - Booster for Pfizer series) 08/15/2020   INFLUENZA VACCINE  04/25/2021    There are no preventive care reminders to display for this patient.   Lab Results  Component Value Date   TSH 2.880 01/27/2021   Lab Results  Component Value Date   WBC 5.2 10/17/2019   HGB 13.8 10/17/2019   HCT 40.6 10/17/2019   MCV 88 10/17/2019   PLT 207 10/17/2019   Lab Results  Component Value Date   NA 138 10/28/2020   K 4.3 10/28/2020   CO2 23 10/28/2020   GLUCOSE 98 10/28/2020   BUN 19 10/28/2020   CREATININE 0.87 10/28/2020   BILITOT 1.0 06/27/2019   ALKPHOS 89 06/27/2019   AST 9 06/27/2019   ALT 9 06/27/2019   PROT 6.7 06/27/2019   ALBUMIN 4.3 06/27/2019   CALCIUM 9.6 10/28/2020   ANIONGAP 7 10/07/2019   Lab Results  Component Value Date   CHOL 202 (H) 06/27/2019   Lab Results  Component Value Date   HDL 55 06/27/2019   Lab Results  Component Value Date   LDLCALC 130 (H) 06/27/2019   Lab Results  Component Value Date   TRIG 95 06/27/2019   Lab Results  Component Value Date   CHOLHDL 3.6 05/13/2018   Lab Results  Component Value Date   HGBA1C 5.0 10/28/2020       Assessment & Plan:   Problem List Items Addressed This Visit   None Visit Diagnoses     Acute cough    -  Primary   Will check covid-19, flu negative. Can continue coricidin, flonase. Encourage fluids and rest. Will treat with prednisone and tessalon prn.   Relevant Orders   Novel Coronavirus, NAA ([facility])   Veritor Flu A/B  Waived   Upper respiratory tract infection, unspecified type       Will check covid-19, flu negative. Can continue coricidin, flonase. Encourage fluids and rest. Will treat with prednisone and tessalon prn.         Meds ordered this encounter  Medications   predniSONE (DELTASONE) 10 MG tablet    Sig: Take 6 tablets today, then 5 tablets tomorrow, then decrease by 1 tablet every day until gone    Dispense:  21 tablet    Refill:  0   benzonatate (TESSALON) 100 MG capsule    Sig: Take 1-2 capsules (100-200 mg total) by mouth 3 (three) times daily as needed for cough.    Dispense:  30 capsule    Refill:  0      Charyl Dancer, NP

## 2021-07-26 LAB — SARS-COV-2, NAA 2 DAY TAT

## 2021-07-26 LAB — NOVEL CORONAVIRUS, NAA: SARS-CoV-2, NAA: NOT DETECTED

## 2021-07-28 MED ORDER — HYDROCOD POLST-CPM POLST ER 10-8 MG/5ML PO SUER: 5.0000 mL | Freq: Two times a day (BID) | ORAL | 0 refills | Status: DC | PRN

## 2021-08-26 ENCOUNTER — Other Ambulatory Visit: Payer: Self-pay

## 2021-08-26 ENCOUNTER — Ambulatory Visit: Payer: Medicare PPO | Admitting: Nurse Practitioner

## 2021-08-26 ENCOUNTER — Encounter: Payer: Self-pay | Admitting: Nurse Practitioner

## 2021-08-26 VITALS — BP 97/64 | HR 76 | Temp 97.8°F | Wt 222.0 lb

## 2021-08-26 DIAGNOSIS — I700 Atherosclerosis of aorta: Secondary | ICD-10-CM | POA: Diagnosis not present

## 2021-08-26 DIAGNOSIS — N3281 Overactive bladder: Secondary | ICD-10-CM

## 2021-08-26 DIAGNOSIS — F3342 Major depressive disorder, recurrent, in full remission: Secondary | ICD-10-CM

## 2021-08-26 DIAGNOSIS — E782 Mixed hyperlipidemia: Secondary | ICD-10-CM

## 2021-08-26 DIAGNOSIS — I10 Essential (primary) hypertension: Secondary | ICD-10-CM | POA: Diagnosis not present

## 2021-08-26 DIAGNOSIS — Z23 Encounter for immunization: Secondary | ICD-10-CM | POA: Diagnosis not present

## 2021-08-26 DIAGNOSIS — E039 Hypothyroidism, unspecified: Secondary | ICD-10-CM

## 2021-08-26 DIAGNOSIS — Z6837 Body mass index (BMI) 37.0-37.9, adult: Secondary | ICD-10-CM

## 2021-08-26 MED ORDER — MIRABEGRON ER 50 MG PO TB24: 50.0000 mg | ORAL_TABLET | Freq: Every day | ORAL | 4 refills | Status: DC

## 2021-08-26 NOTE — Assessment & Plan Note (Signed)
Chronic, ongoing with BP well below goal today.  Recommend she monitor BP at least a few mornings a week at home and document.  DASH diet at home.  Continue current medication regimen and adjust as needed -- recommend if BP at home in same level then reduce Losartan to 50 MG daily.  Labs today: CMP.  Return in 6 months.

## 2021-08-26 NOTE — Assessment & Plan Note (Signed)
Chronic, ongoing.  Continue current Levothyroxine dose and adjust as needed.  Obtain TSH and Free T4 today. 

## 2021-08-26 NOTE — Assessment & Plan Note (Signed)
BMI 37.30.  Has lost weight over past several months with Optavia diet.  Recommend to continue this.  Recommended eating smaller high protein, low fat meals more frequently and exercising 30 mins a day 5 times a week with a goal of 10-15lb weight loss in the next 3 months. Patient voiced their understanding and motivation to adhere to these recommendations.

## 2021-08-26 NOTE — Progress Notes (Signed)
BP 97/64   Pulse 76   Temp 97.8 F (36.6 C) (Oral)   Wt 222 lb (100.7 kg)   LMP 12/15/1991 (Approximate)   SpO2 94%   BMI 37.30 kg/m    Subjective:    Patient ID: Tiffany Delacruz, female    DOB: Mar 07, 1951, 70 y.o.   MRN: [id number]  HPI: Tiffany Delacruz is a 70 y.o. female  Chief Complaint  Patient presents with   Hyperlipidemia   Hypertension   Depression   Over Active Bladder    Patient states she was instructed to take only one tablet daily, but she noticed it wasn't as effective and she has double up on the dosing. Patient states she has been taking 2 tablets for the past two weeks now.    Hypothyroidism   HYPERTENSION / HYPERLIPIDEMIA Continues on Losartan 100 MG daily, no statin at this time.  Has started back on [place].  Aortic atherosclerosis noted on past imaging. Satisfied with current treatment? yes Duration of hypertension: chronic BP monitoring frequency: not checking BP range:  BP medication side effects: no Duration of hyperlipidemia: chronic Aspirin: no Recent stressors: no Recurrent headaches: no Visual changes: no Palpitations: no Dyspnea: no Chest pain: no Lower extremity edema: no Dizzy/lightheaded: no  The 10-year ASCVD risk score (Arnett DK, et al., 2019) is: 9.2%   Values used to calculate the score:     Age: 67 years     Sex: Female     Is Non-Hispanic African American: No     Diabetic: No     Tobacco smoker: No     Systolic Blood Pressure: 97 mmHg     Is BP treated: Yes     HDL Cholesterol: 55 mg/dL     Total Cholesterol: 202 mg/dL   HYPOTHYROIDISM Continues on Levothyroxine 125 MCG. Thyroid control status:stable Satisfied with current treatment? yes Medication side effects: no Medication compliance: good compliance Etiology of hypothyroidism:  Recent dose adjustment: none Fatigue: yes Cold intolerance: no Heat intolerance: no Weight gain: no Weight loss: no Constipation:yes Diarrhea/loose stools: no Palpitations:  no Lower extremity edema: no Anxiety/depressed mood: no   URINARY INCONTINENCE Taking Myrbetriq, but has been doing double dose and taking 50 MG which she finds benefit from.  Worked with PT on pelvic floor exercises -- continues these at home. Is doing [place] and is supposed to drink a lot of water with this. One child, vaginal birth.  Is wearing panty liners, has to change 3 times a day.  Dysuria: no Urinary frequency: none Urgency: none Small volume voids: no Symptom severity: mild Urinary incontinence: occasional Foul odor: mild Hematuria: no Abdominal pain: no Back pain: no Suprapubic pain/pressure: no Flank pain: no Fever:  no Vomiting: no Status: worsening Sexual activity: No sexually active Treatments attempted: Kegel and physical therapy + Myrbetriq  DEPRESSION She self stopped her Prozac >6 months ago, is doing well without it.   Mood status: controlled Psychotherapy/counseling: none Depressed mood: no Anxious mood: no Anhedonia: no Significant weight loss or gain: no Insomnia: none Fatigue: no Feelings of worthlessness or guilt: no Impaired concentration/indecisiveness: no Suicidal ideations: no Hopelessness: no Crying spells: no Depression screen [facility] 2/9 08/26/2021 01/27/2021 10/28/2020 06/27/2019 11/14/2018  Decreased Interest 0 0 0 0 0  Down, Depressed, Hopeless 0 0 0 0 0  PHQ - 2 Score 0 0 0 0 0  Altered sleeping 0 0 0 0 -  Tired, decreased energy 0 - 0 3 -  Change in appetite 0 -  0 0 -  Feeling bad or failure about yourself  0 0 0 0 -  Trouble concentrating 0 0 0 0 -  Moving slowly or fidgety/restless 0 0 0 0 -  Suicidal thoughts 0 0 0 0 -  PHQ-9 Score 0 0 0 3 -  Difficult doing work/chores Not difficult at all - - - -  Some recent data might be hidden    GAD 7 : Generalized Anxiety Score 08/26/2021 06/27/2019  Nervous, Anxious, on Edge 0 0  Control/stop worrying 0 0  Worry too much - different things 0 0  Trouble relaxing 0 3  Restless 0 0   Easily annoyed or irritable 0 0  Afraid - awful might happen 0 0  Total GAD 7 Score 0 3  Anxiety Difficulty Not difficult at all Not difficult at all   Relevant past medical, surgical, family and social history reviewed and updated as indicated. Interim medical history since our last visit reviewed. Allergies and medications reviewed and updated.  Review of Systems  Constitutional:  Negative for activity change, appetite change, diaphoresis, fatigue and fever.  Respiratory:  Negative for cough, chest tightness, shortness of breath and wheezing.   Cardiovascular:  Negative for chest pain, palpitations and leg swelling.  Gastrointestinal: Negative.   Endocrine: Negative for cold intolerance and heat intolerance.  Genitourinary:  Negative for decreased urine volume, dysuria, frequency, urgency and vaginal discharge.  Neurological: Negative.   Psychiatric/Behavioral: Negative.     Per HPI unless specifically indicated above     Objective:    BP 97/64   Pulse 76   Temp 97.8 F (36.6 C) (Oral)   Wt 222 lb (100.7 kg)   LMP 12/15/1991 (Approximate)   SpO2 94%   BMI 37.30 kg/m   Wt Readings from Last 3 Encounters:  08/26/21 222 lb (100.7 kg)  07/25/21 218 lb 3.2 oz (99 kg)  02/24/21 215 lb 12.8 oz (97.9 kg)    Physical Exam Vitals and nursing note reviewed.  Constitutional:      General: She is awake. She is not in acute distress.    Appearance: She is well-developed and well-groomed. She is obese. She is not ill-appearing.  HENT:     Head: Normocephalic.     Right Ear: Hearing normal. No drainage.     Left Ear: Hearing normal. No drainage.  Eyes:     General: Lids are normal.        Right eye: No discharge.        Left eye: No discharge.     Conjunctiva/sclera: Conjunctivae normal.     Pupils: Pupils are equal, round, and reactive to light.  Neck:     Thyroid: No thyromegaly.     Vascular: No carotid bruit.  Cardiovascular:     Rate and Rhythm: Normal rate and  regular rhythm.     Heart sounds: Normal heart sounds. No murmur heard.   No gallop.  Pulmonary:     Effort: Pulmonary effort is normal. No accessory muscle usage or respiratory distress.     Breath sounds: Normal breath sounds.  Abdominal:     General: Bowel sounds are normal. There is no distension.     Palpations: Abdomen is soft.     Tenderness: There is no abdominal tenderness. There is no right CVA tenderness or left CVA tenderness.  Musculoskeletal:     Cervical back: Normal range of motion and neck supple.     Right lower leg: No edema.  Left lower leg: No edema.  Lymphadenopathy:     Head:     Right side of head: No submental, submandibular, tonsillar, preauricular or posterior auricular adenopathy.     Left side of head: No submental, submandibular, tonsillar, preauricular or posterior auricular adenopathy.     Cervical: No cervical adenopathy.  Skin:    General: Skin is warm and dry.  Neurological:     Mental Status: She is alert and oriented to person, place, and time.     Gait: Gait is intact.     Deep Tendon Reflexes: Reflexes are normal and symmetric.     Reflex Scores:      Brachioradialis reflexes are 2+ on the right side and 2+ on the left side.      Patellar reflexes are 2+ on the right side and 2+ on the left side. Psychiatric:        Attention and Perception: Attention normal.        Mood and Affect: Mood normal.        Speech: Speech normal.        Behavior: Behavior normal. Behavior is cooperative.        Thought Content: Thought content normal.        Judgment: Judgment normal.   Results for orders placed or performed in visit on 07/25/21  Novel Coronavirus, NAA ([facility])   Specimen: Nasopharyngeal(NP) swabs in vial transport medium  Result Value Ref Range   SARS-CoV-2, NAA Not Detected Not Detected  SARS-COV-2, NAA 2 DAY TAT  Result Value Ref Range   SARS-CoV-2, NAA 2 DAY TAT Performed   Veritor Flu A/B Waived  Result Value Ref Range    Influenza A Negative Negative   Influenza B Negative Negative      Assessment & Plan:   Problem List Items Addressed This Visit       Cardiovascular and Mediastinum   Aortic atherosclerosis ([place])    Noted on past imaging, recommend she take a Baby ASA daily and recommend statin initiation in future for prevention, does not wish to start today.  Check lipid panel fasting.      Hypertension    Chronic, ongoing with BP well below goal today.  Recommend she monitor BP at least a few mornings a week at home and document.  DASH diet at home.  Continue current medication regimen and adjust as needed -- recommend if BP at home in same level then reduce Losartan to 50 MG daily.  Labs today: CMP.  Return in 6 months.       Relevant Orders   CBC with Differential/Platelet     Endocrine   Hypothyroidism    Chronic, ongoing.  Continue current Levothyroxine dose and adjust as needed.  Obtain TSH and Free T4 today.      Relevant Orders   TSH   T4, free     Genitourinary   Overactive bladder    Improved with medication.  Will increase Myrbetriq to 50 MG daily, discussed this is safest option in her age group -- is having improved symptoms with this.  Return in 6 months for follow-up.  Consider urology referral if ongoing.        Other   Depression - Primary    Chronic, stable.  Denies SI/HI.  Currently has been off medication for > 6 months and wishes to continue off of this.  Will restart in future if needed.      Hyperlipidemia    Chronic, ongoing.  No  current statin, refuses.  ASCVD 9.2%, statin recommended.  Will obtain lipid panel fasting.      Relevant Orders   Comprehensive metabolic panel   Lipid Panel w/o Chol/HDL Ratio   Obesity    BMI 37.30.  Has lost weight over past several months with Optavia diet.  Recommend to continue this.  Recommended eating smaller high protein, low fat meals more frequently and exercising 30 mins a day 5 times a week with a goal of 10-15lb  weight loss in the next 3 months. Patient voiced their understanding and motivation to adhere to these recommendations.       Other Visit Diagnoses     Flu vaccine need       Flu vaccine today.   Relevant Orders   Flu Vaccine QUAD High Dose([facility])        Follow up plan: Return in about 6 months (around 02/24/2022) for HTN/HLD, THYROID, DEPRESSION, OVERACTIVE BLADDER, OSA.

## 2021-08-26 NOTE — Assessment & Plan Note (Signed)
Improved with medication.  Will increase Myrbetriq to 50 MG daily, discussed this is safest option in her age group -- is having improved symptoms with this.  Return in 6 months for follow-up.  Consider urology referral if ongoing.

## 2021-08-26 NOTE — Assessment & Plan Note (Signed)
Chronic, stable.  Denies SI/HI.  Currently has been off medication for > 6 months and wishes to continue off of this.  Will restart in future if needed.

## 2021-08-26 NOTE — Patient Instructions (Signed)

## 2021-08-26 NOTE — Assessment & Plan Note (Signed)
Noted on past imaging, recommend she take a Baby ASA daily and recommend statin initiation in future for prevention, does not wish to start today.  Check lipid panel fasting.

## 2021-08-26 NOTE — Assessment & Plan Note (Signed)
Chronic, ongoing.  No current statin, refuses.  ASCVD 9.2%, statin recommended.  Will obtain lipid panel fasting.

## 2021-08-27 ENCOUNTER — Other Ambulatory Visit: Payer: Self-pay | Admitting: Nurse Practitioner

## 2021-08-27 DIAGNOSIS — E039 Hypothyroidism, unspecified: Secondary | ICD-10-CM

## 2021-08-27 LAB — CBC WITH DIFFERENTIAL/PLATELET
Basophils Absolute: 0.1 10*3/uL (ref 0.0–0.2)
Basos: 1 %
EOS (ABSOLUTE): 0.2 10*3/uL (ref 0.0–0.4)
Eos: 3 %
Hematocrit: 39.7 % (ref 34.0–46.6)
Hemoglobin: 13.1 g/dL (ref 11.1–15.9)
Immature Grans (Abs): 0 10*3/uL (ref 0.0–0.1)
Immature Granulocytes: 1 %
Lymphocytes Absolute: 1.9 10*3/uL (ref 0.7–3.1)
Lymphs: 33 %
MCH: 29 pg (ref 26.6–33.0)
MCHC: 33 g/dL (ref 31.5–35.7)
MCV: 88 fL (ref 79–97)
Monocytes Absolute: 0.4 10*3/uL (ref 0.1–0.9)
Monocytes: 8 %
Neutrophils Absolute: 3.1 10*3/uL (ref 1.4–7.0)
Neutrophils: 54 %
Platelets: 226 10*3/uL (ref 150–450)
RBC: 4.51 x10E6/uL (ref 3.77–5.28)
RDW: 13.1 % (ref 11.7–15.4)
WBC: 5.7 10*3/uL (ref 3.4–10.8)

## 2021-08-27 LAB — LIPID PANEL W/O CHOL/HDL RATIO
Cholesterol, Total: 226 mg/dL — ABNORMAL HIGH (ref 100–199)
HDL: 68 mg/dL (ref >39)
LDL Chol Calc (NIH): 144 mg/dL — ABNORMAL HIGH (ref 0–99)
Triglycerides: 80 mg/dL (ref 0–149)
VLDL Cholesterol Cal: 14 mg/dL (ref 5–40)

## 2021-08-27 LAB — COMPREHENSIVE METABOLIC PANEL
ALT: 13 IU/L (ref 0–32)
AST: 12 IU/L (ref 0–40)
Albumin/Globulin Ratio: 1.8 (ref 1.2–2.2)
Albumin: 4.1 g/dL (ref 3.7–4.7)
Alkaline Phosphatase: 67 IU/L (ref 44–121)
BUN/Creatinine Ratio: 34 — ABNORMAL HIGH (ref 12–28)
BUN: 29 mg/dL — ABNORMAL HIGH (ref 8–27)
Bilirubin Total: 0.5 mg/dL (ref 0.0–1.2)
CO2: 23 mmol/L (ref 20–29)
Calcium: 9.2 mg/dL (ref 8.7–10.3)
Chloride: 102 mmol/L (ref 96–106)
Creatinine, Ser: 0.86 mg/dL (ref 0.57–1.00)
Globulin, Total: 2.3 g/dL (ref 1.5–4.5)
Glucose: 84 mg/dL (ref 70–99)
Potassium: 4.4 mmol/L (ref 3.5–5.2)
Sodium: 141 mmol/L (ref 134–144)
Total Protein: 6.4 g/dL (ref 6.0–8.5)
eGFR: 72 mL/min/{1.73_m2} (ref >59)

## 2021-08-27 LAB — TSH: TSH: 5.15 u[IU]/mL — ABNORMAL HIGH (ref 0.450–4.500)

## 2021-08-27 LAB — T4, FREE: Free T4: 1.39 ng/dL (ref 0.82–1.77)

## 2021-08-27 NOTE — Progress Notes (Signed)
Contacted via MyChart -- needs 6 week lab only visit scheduled   Good morning Tiffany Delacruz, your labs have returned: - Thyroid levels show mild elevation in TSH, but normal Free T4.  I would like you to return in 6 weeks for lab only visit to recheck this and see if we need to adjust dosing.  Please ensure you are taking Levothyroxine every morning -- 30 minutes before other medications or food.  My staff will call to schedule lab only visit. - Kidney function, creatinine and eGFR, remains normal + liver function, AST and ALT, normal. - CBC shows no anemia or infection.   - Your cholesterol is still high, but continued recommendations to make lifestyle changes. Your LDL is above normal. The LDL is the bad cholesterol. Over time and in combination with inflammation and other factors, this contributes to plaque which in turn may lead to stroke and/or heart attack down the road. Sometimes high LDL is primarily genetic, and people might be eating all the right foods but still have high numbers. Other times, there is room for improvement in one's diet and eating healthier can bring this number down and potentially reduce one's risk of heart attack and/or stroke.   To reduce your LDL, Remember - more fruits and vegetables, more fish, and limit red meat and dairy products. More soy, nuts, beans, barley, lentils, oats and plant sterol ester enriched margarine instead of butter. I also encourage eliminating sugar and processed food. Remember, shop on the outside of the grocery store and visit your [employer]. If you would like to talk with me about dietary changes plus or Delacruz medications for your cholesterol, please let me know. We should recheck your cholesterol in 6 months.  Any questions? Keep being amazing!!  Thank you for allowing me to participate in your care.  I appreciate you. Kindest regards, Bana Borgmeyer

## 2021-08-29 ENCOUNTER — Telehealth: Payer: Self-pay | Admitting: Nurse Practitioner

## 2021-08-29 NOTE — Telephone Encounter (Signed)
Called patient and left a voicemail to call the office to schedule lab only visit.

## 2021-08-29 NOTE — Telephone Encounter (Signed)
-----  Message from Venita Lick, NP sent at 08/27/2021  9:55 AM EST ----- Contacted via MyChart -- needs 6 week lab only visit scheduled   Good morning Romie Minus, your labs have returned: - Thyroid levels show mild elevation in TSH, but normal Free T4.  I would like you to return in 6 weeks for lab only visit to recheck this and see if we need to adjust dosing.  Please ensure you are taking Levothyroxine every morning -- 30 minutes before other medications or food.  My staff will call to schedule lab only visit. - Kidney function, creatinine and eGFR, remains normal + liver function, AST and ALT, normal. - CBC shows no anemia or infection.   - Your cholesterol is still high, but continued recommendations to make lifestyle changes. Your LDL is above normal. The LDL is the bad cholesterol. Over time and in combination with inflammation and other factors, this contributes to plaque which in turn may lead to stroke and/or heart attack down the road. Sometimes high LDL is primarily genetic, and people might be eating all the right foods but still have high numbers. Other times, there is room for improvement in one's diet and eating healthier can bring this number down and potentially reduce one's risk of heart attack and/or stroke.   To reduce your LDL, Remember - more fruits and vegetables, more fish, and limit red meat and dairy products. More soy, nuts, beans, barley, lentils, oats and plant sterol ester enriched margarine instead of butter. I also encourage eliminating sugar and processed food. Remember, shop on the outside of the grocery store and visit your [employer]. If you would like to talk with me about dietary changes plus or minus medications for your cholesterol, please let me know. We should recheck your cholesterol in 6 months.  Any questions? Keep being amazing!!  Thank you for allowing me to participate in your care.  I appreciate you. Kindest regards, Jolene

## 2021-09-25 HISTORY — PX: TRIGGER FINGER RELEASE: SHX641

## 2021-10-24 ENCOUNTER — Other Ambulatory Visit: Payer: Self-pay | Admitting: Nurse Practitioner

## 2021-10-24 NOTE — Telephone Encounter (Signed)
TSH due 11/2021. Future visit I 4 months  Requested Prescriptions  Pending Prescriptions Disp Refills   levothyroxine (SYNTHROID) 125 MCG tablet [Pharmacy Med Name: LEVOTHYROXINE 125 MCG TABLET] 90 tablet 2    Sig: TAKE 1 TABLET (125 MCG TOTAL) BY MOUTH DAILY. STOP 150 MCG DOSING.     Endocrinology:  Hypothyroid Agents Failed - 10/24/2021  1:36 AM      Failed - TSH needs to be rechecked within 3 months after an abnormal result. Refill until TSH is due.      Failed - TSH in normal range and within 360 days    TSH  Date Value Ref Range Status  08/26/2021 5.150 (H) 0.450 - 4.500 uIU/mL Final         Passed - Valid encounter within last 12 months    Recent Outpatient Visits          1 month ago Recurrent major depressive disorder, in full remission ([place])   [place], [place] T, NP   3 months ago Acute cough   [place], Lauren A, NP   8 months ago Overactive bladder   [place], [place] T, NP   9 months ago Hypothyroidism, unspecified type   [facility] [place], Jolene T, NP   12 months ago Recurrent major depressive disorder, in full remission ([place])   [place], Barbaraann Faster, NP      Future Appointments            In 4 months Cannady, Barbaraann Faster, NP [employer], [facility]

## 2021-10-28 ENCOUNTER — Encounter: Payer: Self-pay | Admitting: Nurse Practitioner

## 2021-11-04 ENCOUNTER — Encounter: Payer: Self-pay | Admitting: Nurse Practitioner

## 2021-11-07 MED ORDER — MIRABEGRON ER 50 MG PO TB24: 50.0000 mg | ORAL_TABLET | Freq: Every day | ORAL | 4 refills | Status: DC

## 2022-01-05 ENCOUNTER — Other Ambulatory Visit: Payer: Self-pay | Admitting: Nurse Practitioner

## 2022-01-05 DIAGNOSIS — I10 Essential (primary) hypertension: Secondary | ICD-10-CM

## 2022-01-05 NOTE — Telephone Encounter (Signed)
Requested Prescriptions  ?Pending Prescriptions Disp Refills  ?? losartan (COZAAR) 100 MG tablet [Pharmacy Med Name: LOSARTAN POTASSIUM 100 MG TAB] 90 tablet 0  ?  Sig: TAKE 1 TABLET BY MOUTH EVERY DAY  ?  ? Cardiovascular:  Angiotensin Receptor Blockers Passed - 01/05/2022  2:45 AM  ?  ?  Passed - Cr in normal range and within 180 days  ?  Creatinine  ?Date Value Ref Range Status  ?06/18/2013 0.96 0.60 - 1.30 mg/dL Final  ? ?Creatinine, Ser  ?Date Value Ref Range Status  ?08/26/2021 0.86 0.57 - 1.00 mg/dL Final  ?   ?  ?  Passed - K in normal range and within 180 days  ?  Potassium  ?Date Value Ref Range Status  ?08/26/2021 4.4 3.5 - 5.2 mmol/L Final  ?06/18/2013 3.6 3.5 - 5.1 mmol/L Final  ?   ?  ?  Passed - Patient is not pregnant  ?  ?  Passed - Last BP in normal range  ?  BP Readings from Last 1 Encounters:  ?08/26/21 97/64  ?   ?  ?  Passed - Valid encounter within last 6 months  ?  Recent Outpatient Visits   ?      ? 4 months ago Recurrent major depressive disorder, in full remission ([place])  ? [place], [place] T, NP  ? 5 months ago Acute cough  ? [facility], Lauren A, NP  ? 10 months ago Overactive bladder  ? [facility] [place], Westley T, NP  ? 11 months ago Hypothyroidism, unspecified type  ? [place], [place] T, NP  ? 1 year ago Recurrent major depressive disorder, in full remission ([place])  ? [facility] [place], Henrine Screws T, NP  ?  ?  ?Future Appointments   ?        ? In 2 months Cannady, Barbaraann Faster, NP [employer], [facility]  ?  ? ?  ?  ?  ? ?

## 2022-02-15 ENCOUNTER — Telehealth: Payer: Self-pay

## 2022-02-15 NOTE — Telephone Encounter (Signed)
Called and LVM asking for patient to please return my call. Patient is overdue for mammogram. Wanted to call and schedule this for the patient is she is agreeable.   OK for [facility] to speak with patient and find out if  I can schedule their mammogram. Please find out if the patient has an preferences to days of the week and or/times of day for this.

## 2022-02-24 ENCOUNTER — Ambulatory Visit: Payer: Medicare PPO | Admitting: Nurse Practitioner

## 2022-03-01 NOTE — Patient Instructions (Incomplete)

## 2022-03-06 ENCOUNTER — Ambulatory Visit: Payer: Medicare PPO | Admitting: Nurse Practitioner

## 2022-03-06 DIAGNOSIS — R7301 Impaired fasting glucose: Secondary | ICD-10-CM

## 2022-03-06 DIAGNOSIS — E039 Hypothyroidism, unspecified: Secondary | ICD-10-CM

## 2022-03-06 DIAGNOSIS — I700 Atherosclerosis of aorta: Secondary | ICD-10-CM

## 2022-03-06 DIAGNOSIS — E782 Mixed hyperlipidemia: Secondary | ICD-10-CM

## 2022-03-06 DIAGNOSIS — K219 Gastro-esophageal reflux disease without esophagitis: Secondary | ICD-10-CM

## 2022-03-06 DIAGNOSIS — I10 Essential (primary) hypertension: Secondary | ICD-10-CM

## 2022-03-06 DIAGNOSIS — N3281 Overactive bladder: Secondary | ICD-10-CM

## 2022-03-06 DIAGNOSIS — Z6837 Body mass index (BMI) 37.0-37.9, adult: Secondary | ICD-10-CM

## 2022-03-06 DIAGNOSIS — I200 Unstable angina: Secondary | ICD-10-CM

## 2022-03-06 DIAGNOSIS — G4733 Obstructive sleep apnea (adult) (pediatric): Secondary | ICD-10-CM

## 2022-03-06 DIAGNOSIS — F3342 Major depressive disorder, recurrent, in full remission: Secondary | ICD-10-CM

## 2022-03-09 ENCOUNTER — Other Ambulatory Visit: Payer: Self-pay | Admitting: Nurse Practitioner

## 2022-03-10 NOTE — Telephone Encounter (Signed)
Refused Myrbetriq 25 mg because dose is 50 mg now

## 2022-03-16 DIAGNOSIS — I700 Atherosclerosis of aorta: Secondary | ICD-10-CM | POA: Diagnosis not present

## 2022-03-16 DIAGNOSIS — M65342 Trigger finger, left ring finger: Secondary | ICD-10-CM | POA: Diagnosis not present

## 2022-03-16 DIAGNOSIS — G5601 Carpal tunnel syndrome, right upper limb: Secondary | ICD-10-CM | POA: Diagnosis not present

## 2022-04-01 NOTE — Patient Instructions (Signed)

## 2022-04-03 ENCOUNTER — Ambulatory Visit: Payer: Medicare PPO | Admitting: Nurse Practitioner

## 2022-04-03 ENCOUNTER — Encounter: Payer: Self-pay | Admitting: Nurse Practitioner

## 2022-04-03 VITALS — BP 134/81 | HR 69 | Temp 98.0°F | Wt 232.4 lb

## 2022-04-03 DIAGNOSIS — E782 Mixed hyperlipidemia: Secondary | ICD-10-CM

## 2022-04-03 DIAGNOSIS — K219 Gastro-esophageal reflux disease without esophagitis: Secondary | ICD-10-CM | POA: Diagnosis not present

## 2022-04-03 DIAGNOSIS — I208 Other forms of angina pectoris: Secondary | ICD-10-CM | POA: Insufficient documentation

## 2022-04-03 DIAGNOSIS — F3342 Major depressive disorder, recurrent, in full remission: Secondary | ICD-10-CM

## 2022-04-03 DIAGNOSIS — I700 Atherosclerosis of aorta: Secondary | ICD-10-CM

## 2022-04-03 DIAGNOSIS — N3281 Overactive bladder: Secondary | ICD-10-CM | POA: Diagnosis not present

## 2022-04-03 DIAGNOSIS — I10 Essential (primary) hypertension: Secondary | ICD-10-CM

## 2022-04-03 DIAGNOSIS — Z6837 Body mass index (BMI) 37.0-37.9, adult: Secondary | ICD-10-CM

## 2022-04-03 DIAGNOSIS — E559 Vitamin D deficiency, unspecified: Secondary | ICD-10-CM

## 2022-04-03 DIAGNOSIS — I200 Unstable angina: Secondary | ICD-10-CM

## 2022-04-03 DIAGNOSIS — G4733 Obstructive sleep apnea (adult) (pediatric): Secondary | ICD-10-CM

## 2022-04-03 DIAGNOSIS — E039 Hypothyroidism, unspecified: Secondary | ICD-10-CM

## 2022-04-03 DIAGNOSIS — R7301 Impaired fasting glucose: Secondary | ICD-10-CM

## 2022-04-03 DIAGNOSIS — J3089 Other allergic rhinitis: Secondary | ICD-10-CM

## 2022-04-03 LAB — MICROALBUMIN, URINE WAIVED
Creatinine, Urine Waived: 100 mg/dL (ref 10–300)
Microalb, Ur Waived: 10 mg/L (ref 0–19)
Microalb/Creat Ratio: <30 mg/g (ref <30)

## 2022-04-03 LAB — BAYER DCA HB A1C WAIVED: HB A1C (BAYER DCA - WAIVED): 5.2 % (ref 4.8–5.6)

## 2022-04-03 MED ORDER — LOSARTAN POTASSIUM 100 MG PO TABS
100.0000 mg | ORAL_TABLET | Freq: Every day | ORAL | 4 refills | Status: DC
Start: 2022-04-03 — End: 2023-07-02

## 2022-04-03 MED ORDER — ALBUTEROL SULFATE HFA 108 (90 BASE) MCG/ACT IN AERS
INHALATION_SPRAY | RESPIRATORY_TRACT | 3 refills | Status: DC
Start: 2022-04-03 — End: 2022-11-29

## 2022-04-03 MED ORDER — FLUTICASONE PROPIONATE 50 MCG/ACT NA SUSP: NASAL | 3 refills | Status: DC

## 2022-04-03 MED ORDER — FLUOXETINE HCL 20 MG PO TABS: 20.0000 mg | ORAL_TABLET | Freq: Every day | ORAL | 4 refills | Status: DC

## 2022-04-03 NOTE — Assessment & Plan Note (Addendum)
Chronic, stable.  Will continue Myrbetriq 50 MG daily, discussed this is safest option in her age group -- is having improved symptoms with this.  Return in 6 months for follow-up.  Consider urology referral if ongoing. 

## 2022-04-03 NOTE — Assessment & Plan Note (Signed)
Chronic, ongoing with BP well below goal today.  Recommend she monitor BP at least a few mornings a week at home and document.  DASH diet at home.  Continue current medication regimen and adjust as needed -- recommend if BP at home in same level then reduce Losartan to 50 MG daily.  Labs today: CMP and urine ALB.  Return in 6 months.

## 2022-04-03 NOTE — Assessment & Plan Note (Addendum)
Chronic, ongoing.  No current statin, refuses.  ASCVD 16.9%, statin recommended.  Will obtain lipid panel non fasting.

## 2022-04-03 NOTE — Assessment & Plan Note (Signed)
Chronic and stable with no recent CP or NTG use.  Return to cardiology as needed.

## 2022-04-03 NOTE — Assessment & Plan Note (Addendum)
A1c 5.2% today and urine ALB 10 (July 2023).  Praised for ongoing success.  Continue diet focus as remains out of prediabetic range.

## 2022-04-03 NOTE — Assessment & Plan Note (Addendum)
Noted on past imaging, recommend she take a Baby ASA daily and recommend statin initiation in future for prevention, does not wish to start today.  Check lipid panel non fasting.

## 2022-04-03 NOTE — Assessment & Plan Note (Signed)
Chronic, ongoing, continue 100% CPAP use.

## 2022-04-03 NOTE — Assessment & Plan Note (Signed)
Chronic, stable.  Denies SI/HI.  Currently has been off medication for > 9 months, but would like refills sent in due to occasional depressed mood, would like to have medication on hand as needed.

## 2022-04-03 NOTE — Assessment & Plan Note (Signed)
BMI 39.05.  Recommend to continue this.  Recommended eating smaller high protein, low fat meals more frequently and exercising 30 mins a day 5 times a week with a goal of 10-15lb weight loss in the next 3 months. Patient voiced their understanding and motivation to adhere to these recommendations.

## 2022-04-03 NOTE — Assessment & Plan Note (Signed)
Chronic, ongoing.  Continue current Levothyroxine dose and adjust as needed.  Obtain TSH and Free T4 today. 

## 2022-04-03 NOTE — Assessment & Plan Note (Signed)
Chronic, stable with occasional Pepcid use.  Continue this and check Mag level today.

## 2022-04-03 NOTE — Progress Notes (Signed)
BP 134/81   Pulse 69   Temp 98 F (36.7 C)   Wt 232 lb 6.4 oz (105.4 kg)   LMP 12/15/1991 (Approximate)   SpO2 97%   BMI 39.05 kg/m    Subjective:    Patient ID: Tiffany Delacruz, female    DOB: Jan 24, 1951, 71 y.o.   MRN: [id number]  HPI: Tiffany Delacruz is a 71 y.o. female  Chief Complaint  Patient presents with   Hypertension   Hypothyroidism   Depression   Overactive Bladder   HYPERTENSION / HYPERLIPIDEMIA Continues on Losartan 100 MG daily, no statin at this time.  Has stopped Optavia diet over past 2 years.  Aortic atherosclerosis noted on past imaging. Continues CPAP use daily.  Continues on Pepcid for heart burn over the counter. Satisfied with current treatment? yes Duration of hypertension: chronic BP monitoring frequency: ever couple days BP range: 120/60-80 range BP medication side effects: no Duration of hyperlipidemia: chronic Aspirin: no Recent stressors: no Recurrent headaches: no Visual changes: no Palpitations: no Dyspnea: recently with smoke in air Chest pain: no Lower extremity edema: no Dizzy/lightheaded: no  The 10-year ASCVD risk score (Arnett DK, et al., 2019) is: 16.9%   Values used to calculate the score:     Age: 49 years     Sex: Female     Is Non-Hispanic African American: No     Diabetic: No     Tobacco smoker: No     Systolic Blood Pressure: 470 mmHg     Is BP treated: Yes     HDL Cholesterol: 68 mg/dL     Total Cholesterol: 226 mg/dL   HYPOTHYROIDISM Continues on Levothyroxine 125 MCG.  She does take with other medications. Thyroid control status:stable Satisfied with current treatment? yes Medication side effects: no Medication compliance: good compliance Etiology of hypothyroidism:  Recent dose adjustment: none Fatigue: yes Cold intolerance: no Heat intolerance: no Weight gain: no Weight loss: no Constipation:yes Diarrhea/loose stools: no Palpitations: no Lower extremity edema: no Anxiety/depressed mood: no    OVERACTIVE BLADDER Taking Myrbetriq 50 MG daily.  Worked with PT on pelvic floor exercises -- continues to perform on own at home.  One child, vaginal birth.  Is wearing panty liners, has to change 3 times a day.  Dysuria: no Urinary frequency: none Urgency: none Small volume voids: no Symptom severity: mild Urinary incontinence: occasional Foul odor: mild Hematuria: no Abdominal pain: no Back pain: no Suprapubic pain/pressure: no Flank pain: no Fever:  no Vomiting: no Status: worsening Sexual activity: No sexually active Treatments attempted: Kegel and physical therapy + Myrbetriq  DEPRESSION She self stopped her Prozac > 9 months, but would like refills as she would like it on hand to use as needed. Mood status: controlled Psychotherapy/counseling: none Depressed mood: occasional Anxious mood: no Anhedonia: no Significant weight loss or gain: no Insomnia: no Fatigue: no Feelings of worthlessness or guilt: no Impaired concentration/indecisiveness: no Suicidal ideations: no Hopelessness: no Crying spells: no    04/03/2022    9:37 AM 08/26/2021    8:59 AM 01/27/2021    9:44 AM 10/28/2020    9:39 AM 06/27/2019    9:22 AM  Depression screen PHQ 2/9  Decreased Interest 2 0 0 0 0  Down, Depressed, Hopeless 2 0 0 0 0  PHQ - 2 Score 4 0 0 0 0  Altered sleeping 0 0 0 0 0  Tired, decreased energy 1 0  0 3  Change in appetite 1 0  0 0  Feeling bad or failure about yourself  0 0 0 0 0  Trouble concentrating 0 0 0 0 0  Moving slowly or fidgety/restless 0 0 0 0 0  Suicidal thoughts 0 0 0 0 0  PHQ-9 Score 6 0 0 0 3  Difficult doing work/chores Not difficult at all Not difficult at all          04/03/2022    9:37 AM 08/26/2021    8:59 AM 06/27/2019    9:22 AM  GAD 7 : Generalized Anxiety Score  Nervous, Anxious, on Edge 0 0 0  Control/stop worrying 0 0 0  Worry too much - different things 0 0 0  Trouble relaxing 0 0 3  Restless 0 0 0  Easily annoyed or irritable 0 0 0   Afraid - awful might happen 0 0 0  Total GAD 7 Score 0 0 3  Anxiety Difficulty  Not difficult at all Not difficult at all   Relevant past medical, surgical, family and social history reviewed and updated as indicated. Interim medical history since our last visit reviewed. Allergies and medications reviewed and updated.  Review of Systems  Constitutional:  Negative for activity change, appetite change, diaphoresis, fatigue and fever.  Respiratory:  Negative for cough, chest tightness, shortness of breath and wheezing.   Cardiovascular:  Negative for chest pain, palpitations and leg swelling.  Gastrointestinal: Negative.   Endocrine: Negative for cold intolerance and heat intolerance.  Genitourinary: Negative.   Neurological: Negative.   Psychiatric/Behavioral: Negative.      Per HPI unless specifically indicated above     Objective:    BP 134/81   Pulse 69   Temp 98 F (36.7 C)   Wt 232 lb 6.4 oz (105.4 kg)   LMP 12/15/1991 (Approximate)   SpO2 97%   BMI 39.05 kg/m   Wt Readings from Last 3 Encounters:  04/03/22 232 lb 6.4 oz (105.4 kg)  08/26/21 222 lb (100.7 kg)  07/25/21 218 lb 3.2 oz (99 kg)    Physical Exam Vitals and nursing note reviewed.  Constitutional:      General: She is awake. She is not in acute distress.    Appearance: She is well-developed and well-groomed. She is obese. She is not ill-appearing.  HENT:     Head: Normocephalic.     Right Ear: Hearing normal. No drainage.     Left Ear: Hearing normal. No drainage.  Eyes:     General: Lids are normal.        Right eye: No discharge.        Left eye: No discharge.     Conjunctiva/sclera: Conjunctivae normal.     Pupils: Pupils are equal, round, and reactive to light.  Neck:     Thyroid: No thyromegaly.     Vascular: No carotid bruit.  Cardiovascular:     Rate and Rhythm: Normal rate and regular rhythm.     Heart sounds: Normal heart sounds. No murmur heard.    No gallop.  Pulmonary:      Effort: Pulmonary effort is normal. No accessory muscle usage or respiratory distress.     Breath sounds: Normal breath sounds.  Abdominal:     General: Bowel sounds are normal. There is no distension.     Palpations: Abdomen is soft.     Tenderness: There is no abdominal tenderness. There is no right CVA tenderness or left CVA tenderness.  Musculoskeletal:     Cervical back: Normal range of motion and  neck supple.     Right lower leg: No edema.     Left lower leg: No edema.  Lymphadenopathy:     Head:     Right side of head: No submental, submandibular, tonsillar, preauricular or posterior auricular adenopathy.     Left side of head: No submental, submandibular, tonsillar, preauricular or posterior auricular adenopathy.     Cervical: No cervical adenopathy.  Skin:    General: Skin is warm and dry.  Neurological:     Mental Status: She is alert and oriented to person, place, and time.     Gait: Gait is intact.     Deep Tendon Reflexes: Reflexes are normal and symmetric.     Reflex Scores:      Brachioradialis reflexes are 2+ on the right side and 2+ on the left side.      Patellar reflexes are 2+ on the right side and 2+ on the left side. Psychiatric:        Attention and Perception: Attention normal.        Mood and Affect: Mood normal.        Speech: Speech normal.        Behavior: Behavior normal. Behavior is cooperative.        Thought Content: Thought content normal.        Judgment: Judgment normal.    Results for orders placed or performed in visit on 04/03/22  Bayer DCA Hb A1c Waived  Result Value Ref Range   HB A1C (BAYER DCA - WAIVED) 5.2 4.8 - 5.6 %  Microalbumin, Urine Waived  Result Value Ref Range   Microalb, Ur Waived 10 0 - 19 mg/L   Creatinine, Urine Waived 100 10 - 300 mg/dL   Microalb/Creat Ratio <30 <30 mg/g      Assessment & Plan:   Problem List Items Addressed This Visit       Cardiovascular and Mediastinum   Aortic atherosclerosis ([place])     Noted on past imaging, recommend she take a Baby ASA daily and recommend statin initiation in future for prevention, does not wish to start today.  Check lipid panel non fasting.      Relevant Medications   losartan (COZAAR) 100 MG tablet   Other Relevant Orders   Comprehensive metabolic panel   Lipid Panel w/o Chol/HDL Ratio   Hypertension    Chronic, ongoing with BP well below goal today.  Recommend she monitor BP at least a few mornings a week at home and document.  DASH diet at home.  Continue current medication regimen and adjust as needed -- recommend if BP at home in same level then reduce Losartan to 50 MG daily.  Labs today: CMP and urine ALB.  Return in 6 months.       Relevant Medications   losartan (COZAAR) 100 MG tablet   Other Relevant Orders   Microalbumin, Urine Waived (Completed)   Comprehensive metabolic panel   Stable angina (HCC)    Chronic and stable with no recent CP or NTG use.  Return to cardiology as needed.      Relevant Medications   losartan (COZAAR) 100 MG tablet   FLUoxetine (PROZAC) 20 MG tablet     Respiratory   OSA (obstructive sleep apnea)    Chronic, ongoing, continue 100% CPAP use.      Other allergic rhinitis   Relevant Medications   fluticasone (FLONASE) 50 MCG/ACT nasal spray     Digestive   GERD (gastroesophageal reflux disease)  Chronic, stable with occasional Pepcid use.  Continue this and check Mag level today.      Relevant Orders   Magnesium     Endocrine   Hypothyroidism    Chronic, ongoing.  Continue current Levothyroxine dose and adjust as needed.  Obtain TSH and Free T4 today.      Relevant Orders   T4, free   TSH   IFG (impaired fasting glucose)    A1c 5.2% today and urine ALB 10 (July 2023).  Praised for ongoing success.  Continue diet focus as remains out of prediabetic range.      Relevant Orders   Bayer DCA Hb A1c Waived (Completed)   Microalbumin, Urine Waived (Completed)     Genitourinary    Overactive bladder    Chronic, stable.  Will continue Myrbetriq 50 MG daily, discussed this is safest option in her age group -- is having improved symptoms with this.  Return in 6 months for follow-up.  Consider urology referral if ongoing.      Relevant Orders   Comprehensive metabolic panel     Other   Depression - Primary    Chronic, stable.  Denies SI/HI.  Currently has been off medication for > 9 months, but would like refills sent in due to occasional depressed mood, would like to have medication on hand as needed.      Relevant Medications   FLUoxetine (PROZAC) 20 MG tablet   Hyperlipidemia    Chronic, ongoing.  No current statin, refuses.  ASCVD 16.9%, statin recommended.  Will obtain lipid panel non fasting.      Relevant Medications   losartan (COZAAR) 100 MG tablet   Other Relevant Orders   Comprehensive metabolic panel   Lipid Panel w/o Chol/HDL Ratio   Obesity    BMI 39.05.  Recommend to continue this.  Recommended eating smaller high protein, low fat meals more frequently and exercising 30 mins a day 5 times a week with a goal of 10-15lb weight loss in the next 3 months. Patient voiced their understanding and motivation to adhere to these recommendations.       Other Visit Diagnoses     Vitamin D deficiency       History of low levels, check today and initiate supplement as needed.   Relevant Orders   VITAMIN D 25 Hydroxy (Vit-D Deficiency, Fractures)        Follow up plan: Return in about 6 months (around 10/04/2022) for HTN/HLD, OVERACTIVE BLADDER, HYPOTHYROID.

## 2022-04-04 ENCOUNTER — Other Ambulatory Visit: Payer: Self-pay | Admitting: Nurse Practitioner

## 2022-04-04 DIAGNOSIS — E039 Hypothyroidism, unspecified: Secondary | ICD-10-CM

## 2022-04-04 DIAGNOSIS — E782 Mixed hyperlipidemia: Secondary | ICD-10-CM

## 2022-04-04 LAB — LIPID PANEL W/O CHOL/HDL RATIO
Cholesterol, Total: 217 mg/dL — ABNORMAL HIGH (ref 100–199)
HDL: 71 mg/dL (ref >39)
LDL Chol Calc (NIH): 125 mg/dL — ABNORMAL HIGH (ref 0–99)
Triglycerides: 118 mg/dL (ref 0–149)
VLDL Cholesterol Cal: 21 mg/dL (ref 5–40)

## 2022-04-04 LAB — COMPREHENSIVE METABOLIC PANEL
ALT: 12 IU/L (ref 0–32)
AST: 8 IU/L (ref 0–40)
Albumin/Globulin Ratio: 1.8 (ref 1.2–2.2)
Albumin: 4.1 g/dL (ref 3.8–4.8)
Alkaline Phosphatase: 71 IU/L (ref 44–121)
BUN/Creatinine Ratio: 28 (ref 12–28)
BUN: 23 mg/dL (ref 8–27)
Bilirubin Total: 0.6 mg/dL (ref 0.0–1.2)
CO2: 25 mmol/L (ref 20–29)
Calcium: 9.3 mg/dL (ref 8.7–10.3)
Chloride: 96 mmol/L (ref 96–106)
Creatinine, Ser: 0.82 mg/dL (ref 0.57–1.00)
Globulin, Total: 2.3 g/dL (ref 1.5–4.5)
Glucose: 98 mg/dL (ref 70–99)
Potassium: 4.2 mmol/L (ref 3.5–5.2)
Sodium: 136 mmol/L (ref 134–144)
Total Protein: 6.4 g/dL (ref 6.0–8.5)
eGFR: 76 mL/min/{1.73_m2} (ref >59)

## 2022-04-04 LAB — TSH: TSH: 9.11 u[IU]/mL — ABNORMAL HIGH (ref 0.450–4.500)

## 2022-04-04 LAB — MAGNESIUM: Magnesium: 1.7 mg/dL (ref 1.6–2.3)

## 2022-04-04 LAB — T4, FREE: Free T4: 1.41 ng/dL (ref 0.82–1.77)

## 2022-04-04 LAB — VITAMIN D 25 HYDROXY (VIT D DEFICIENCY, FRACTURES): Vit D, 25-Hydroxy: 28.1 ng/mL — ABNORMAL LOW (ref 30.0–100.0)

## 2022-04-04 MED ORDER — ROSUVASTATIN CALCIUM 10 MG PO TABS: 10.0000 mg | ORAL_TABLET | Freq: Every day | ORAL | 3 refills | Status: DC

## 2022-04-04 MED ORDER — LEVOTHYROXINE SODIUM 150 MCG PO TABS: 150.0000 ug | ORAL_TABLET | Freq: Every day | ORAL | 3 refills | Status: DC

## 2022-04-04 NOTE — Progress Notes (Signed)
Contacted via [place] -- needs lab only visit in 6 weeks please The 10-year ASCVD risk score (Arnett DK, et al., 2019) is: 16.7%   Values used to calculate the score:     Age: 71 years     Sex: Female     Is Non-Hispanic African American: No     Diabetic: No     Tobacco smoker: No     Systolic Blood Pressure: 921 mmHg     Is BP treated: Yes     HDL Cholesterol: 71 mg/dL     Total Cholesterol: 217 mg/dL   Good evening Romie Minus, your labs have returned: - Thyroid labs show elevated TSH and Free T4 normal.  I do recommend we increase Levothyroxine to 150 MCG daily and have sent this in, stop 125 MCG dosing.  We will recheck labs outpatient in 6 weeks, my staff will call to schedule lab visit. - Cholesterol labs are elevated still I do recommend we start a statin and will send in a low dose, Rosuvastatin 10 MG to start.  Goal is to help lower your risk for cardiac event, will recheck these labs in 6 weeks too. - Vitamin D level is on low side, please start Vitamin D3 2000 units daily for bone health. - Kidney function, creatinine and eGFR, remains normal, as is liver function, AST and ALT.  Any questions? Keep being amazing!!  Thank you for allowing me to participate in your care.  I appreciate you. Kindest regards, Rexton Greulich

## 2022-04-10 DIAGNOSIS — M65342 Trigger finger, left ring finger: Secondary | ICD-10-CM | POA: Diagnosis not present

## 2022-04-25 DIAGNOSIS — G5601 Carpal tunnel syndrome, right upper limb: Secondary | ICD-10-CM | POA: Diagnosis not present

## 2022-05-10 DIAGNOSIS — M65342 Trigger finger, left ring finger: Secondary | ICD-10-CM | POA: Diagnosis not present

## 2022-05-17 ENCOUNTER — Other Ambulatory Visit: Payer: Medicare PPO

## 2022-05-17 DIAGNOSIS — E039 Hypothyroidism, unspecified: Secondary | ICD-10-CM | POA: Diagnosis not present

## 2022-05-17 DIAGNOSIS — E782 Mixed hyperlipidemia: Secondary | ICD-10-CM | POA: Diagnosis not present

## 2022-05-18 LAB — TSH: TSH: 1.25 u[IU]/mL (ref 0.450–4.500)

## 2022-05-18 LAB — LIPID PANEL W/O CHOL/HDL RATIO
Cholesterol, Total: 130 mg/dL (ref 100–199)
HDL: 59 mg/dL (ref >39)
LDL Chol Calc (NIH): 58 mg/dL (ref 0–99)
Triglycerides: 64 mg/dL (ref 0–149)
VLDL Cholesterol Cal: 13 mg/dL (ref 5–40)

## 2022-05-18 LAB — T4, FREE: Free T4: 1.93 ng/dL — ABNORMAL HIGH (ref 0.82–1.77)

## 2022-05-18 NOTE — Progress Notes (Signed)
Contacted via [employer] day Romie Minus, labs have returned.  Thyroid labs show improved TSH, Free T4 a bit elevated -- for now continue current Levothyroxine dosing.  Cholesterol labs much improved!!  Continue the low dose of Rosuvastatin.  It is working well.  Any questions? Keep being amazing!!  Thank you for allowing me to participate in your care.  I appreciate you. Kindest regards, Yostin Malacara

## 2022-05-19 NOTE — Discharge Instructions (Addendum)
  Instructions after Hand / Wrist Surgery   James P. Holley Bouche., M.D.  Dept. of [facility]  [place], [place]  [address]   Phone: [phone]   Fax: [phone]   DIET: Drink plenty of non-alcoholic fluids & begin a light diet. Resume your normal diet the day after surgery.  ACTIVITY:  Keep the hand elevated above the level of the elbow. Begin gently moving the fingers on a regular basis to avoid stiffness. Avoid any heavy lifting, pushing, or pulling with the operative hand. Do not drive or operate any equipment until instructed.  WOUND CARE:  Keep the splint/bandage clean and dry.  The splint and stitches will be removed in the office. Continue to use the ice packs periodically to reduce pain and swelling. You may bathe or shower after the stitches are removed at the first office visit following surgery.  MEDICATIONS: You may resume your regular medications. Please take the pain medication as prescribed. Do not take pain medication on an empty stomach. Do not drive or drink alcoholic beverages when taking pain medications.  CALL THE OFFICE FOR: Temperature above 101 degrees Excessive bleeding or drainage on the dressing. Excessive swelling, coldness, or paleness of the fingers. Persistent nausea and vomiting.  FOLLOW-UP:  You should have an appointment to return to the office in 7-10 days after surgery.   REMEMBER: R.I.C.E. = Rest, Ice, Compression, Elevation !     [facility] Department Directory         [email or web address]       [email or web address]   AMBULATORY SURGERY  DISCHARGE INSTRUCTIONS   The drugs that you were given will stay in your system until tomorrow so for the next 24 hours you should not:  Drive an automobile Make any legal decisions Drink any alcoholic beverage   You may resume regular meals tomorrow.  Today it is better to start with  liquids and gradually work up to solid foods.  You may eat anything you prefer, but it is better to start with liquids, then soup and crackers, and gradually work up to solid foods.   Please notify your doctor immediately if you have any unusual bleeding, trouble breathing, redness and pain at the surgery site, drainage, fever, or pain not relieved by medication.    Please contact your physician with any problems or Same Day Surgery at [phone], Monday through Friday 6 am to 4 pm, or [place] at [facility] number at [phone].

## 2022-05-26 ENCOUNTER — Other Ambulatory Visit: Payer: Self-pay

## 2022-05-26 ENCOUNTER — Encounter
Admission: RE | Admit: 2022-05-26 | Discharge: 2022-05-26 | Disposition: A | Discharge status: Home / Self Care | Payer: Medicare PPO | Source: Ambulatory Visit | Attending: Orthopedic Surgery | Admitting: Orthopedic Surgery

## 2022-05-26 DIAGNOSIS — Z01812 Encounter for preprocedural laboratory examination: Secondary | ICD-10-CM

## 2022-05-26 HISTORY — DX: Angina pectoris, unspecified: I20.9

## 2022-05-26 NOTE — Patient Instructions (Addendum)
Your procedure is scheduled on: 06/05/22 - Monday Report to the Registration Desk on the 1st floor of the [facility]. To find out your arrival time, please call [phone] between 1PM - 3PM on: 06/02/22 - Friday If your arrival time is 6:00 am, do not arrive prior to that time as the [facility] entrance doors do not open until 6:00 am.  REMEMBER: Instructions that are not followed completely may result in serious medical risk, up to and including death; or upon the discretion of your surgeon and anesthesiologist your surgery may need to be rescheduled.  Do not eat food after midnight the night before surgery.  No gum chewing, lozengers or hard candies.  You may however, drink CLEAR liquids up to 2 hours before you are scheduled to arrive for your surgery. Do not drink anything within 2 hours of your scheduled arrival time.  Clear liquids include: - water  - apple juice without pulp - gatorade (not RED colors) - black coffee or tea (Do NOT add milk or creamers to the coffee or tea) Do NOT drink anything that is not on this list.  In addition, your doctor has ordered for you to drink the provided  Ensure Pre-Surgery Clear Carbohydrate Drink  Drinking this carbohydrate drink up to two hours before surgery helps to reduce insulin resistance and improve patient outcomes. Please complete drinking 2 hours prior to scheduled arrival time.  TAKE THESE MEDICATIONS THE MORNING OF SURGERY WITH A SIP OF WATER:  - famotidine (PEPCID) , (take one the night before and one on the morning of surgery - helps to prevent nausea after surgery.) - levothyroxine (SYNTHROID)   Use inhalers albuterol (VENTOLIN HFA) 108 (90 Base) on the day of surgery and bring to the hospital.  One week prior to surgery: Stop Anti-inflammatories (NSAIDS) such as Advil, Aleve, Ibuprofen, Motrin, Naproxen, Naprosyn and Aspirin based products such as Excedrin, Goodys Powder, BC Powder.  Stop ANY OVER THE COUNTER  supplements until after surgery.  You may however, continue to take Tylenol if needed for pain up until the day of surgery.  No Alcohol for 24 hours before or after surgery.  No Smoking including e-cigarettes for 24 hours prior to surgery.  No chewable tobacco products for at least 6 hours prior to surgery.  No nicotine patches on the day of surgery.  Do not use any "recreational" drugs for at least a week prior to your surgery.  Please be advised that the combination of cocaine and anesthesia may have negative outcomes, up to and including death. If you test positive for cocaine, your surgery will be cancelled.  On the morning of surgery brush your teeth with toothpaste and water, you may rinse your mouth with mouthwash if you wish. Do not swallow any toothpaste or mouthwash.  Use CHG Soap or wipes as directed on instruction sheet.  Do not wear jewelry, make-up, hairpins, clips or nail polish.  Do not wear lotions, powders, or perfumes.   Do not shave body from the neck down 48 hours prior to surgery just in case you cut yourself which could leave a site for infection.  Also, freshly shaved skin may become irritated if using the CHG soap.  Contact lenses, hearing aids and dentures may not be worn into surgery.  Do not bring valuables to the hospital. [facility] is not responsible for any missing/lost belongings or valuables.   Notify your doctor if there is any change in your medical condition (cold, fever, infection).  Wear comfortable clothing (specific to your surgery type) to the hospital.  After surgery, you can help prevent lung complications by doing breathing exercises.  Take deep breaths and cough every 1-2 hours. Your doctor may order a device called an Incentive Spirometer to help you take deep breaths. When coughing or sneezing, hold a pillow firmly against your incision with both hands. This is called "splinting." Doing this helps protect your incision. It also  decreases belly discomfort.  If you are being admitted to the hospital overnight, leave your suitcase in the car. After surgery it may be brought to your room.  If you are being discharged the day of surgery, you will not be allowed to drive home. You will need a responsible adult (18 years or older) to drive you home and stay with you that night.   If you are taking public transportation, you will need to have a responsible adult (18 years or older) with you. Please confirm with your physician that it is acceptable to use public transportation.   Please call the [facility] at [phone] if you have any questions about these instructions.  Surgery Visitation Policy:  Patients undergoing a surgery or procedure may have two family members or support persons with them as long as the person is not COVID-19 positive or experiencing its symptoms.   Inpatient Visitation:    Visiting hours are 7 a.m. to 8 p.m. Up to four visitors are allowed at one time in a patient room, including children. The visitors may rotate out with other people during the day. One designated support person (adult) may remain overnight.

## 2022-05-30 ENCOUNTER — Encounter: Payer: Self-pay | Admitting: Urgent Care

## 2022-05-30 ENCOUNTER — Encounter
Admission: RE | Admit: 2022-05-30 | Discharge: 2022-05-30 | Disposition: A | Discharge status: Home / Self Care | Payer: Medicare PPO | Source: Ambulatory Visit | Attending: Orthopedic Surgery | Admitting: Orthopedic Surgery

## 2022-05-30 DIAGNOSIS — G5601 Carpal tunnel syndrome, right upper limb: Secondary | ICD-10-CM | POA: Diagnosis not present

## 2022-05-30 DIAGNOSIS — Z01818 Encounter for other preprocedural examination: Secondary | ICD-10-CM | POA: Diagnosis not present

## 2022-05-30 DIAGNOSIS — Z01810 Encounter for preprocedural cardiovascular examination: Secondary | ICD-10-CM | POA: Diagnosis not present

## 2022-05-30 DIAGNOSIS — Z01812 Encounter for preprocedural laboratory examination: Secondary | ICD-10-CM

## 2022-05-30 LAB — CBC
HCT: 37.4 % (ref 36.0–46.0)
Hemoglobin: 12.7 g/dL (ref 12.0–15.0)
MCH: 28.2 pg (ref 26.0–34.0)
MCHC: 34 g/dL (ref 30.0–36.0)
MCV: 82.9 fL (ref 80.0–100.0)
Platelets: 231 10*3/uL (ref 150–400)
RBC: 4.51 MIL/uL (ref 3.87–5.11)
RDW: 13 % (ref 11.5–15.5)
WBC: 6 10*3/uL (ref 4.0–10.5)
nRBC: 0 % (ref 0.0–0.2)

## 2022-06-04 NOTE — H&P (Signed)
ORTHOPAEDIC HISTORY & PHYSICAL Tiffany Delacruz, [place] - 05/30/2022 11:00 AM EDT Formatting of this note is different from the original. [place] Chief Complaint:   Chief Complaint  Patient presents with  Wrist Pain  H & P RIGHT WRIST   History of Present Illness:   Tiffany Delacruz is a 71 y.o. female that presents to clinic today for her preoperative history and evaluation. Patient presents unaccompanied. The patient is scheduled to undergo a right carpal tunnel release on 06/05/22 by Dr. Marry Guan. The patient reports a long history of numbness and tingling. Patient denies any history of injury to the hand.   The patient's symptoms have progressed to the point that they decrease her quality of life. The patient has previously undergone conservative treatment including NSAIDS and activity modification without adequate control of her symptoms.  Denies significant cardiac history, history of DVT.   Past Medical, Surgical, Family, Social History, Allergies, Medications:   Past Medical History:  Past Medical History:  Diagnosis Date  Chickenpox  Environmental allergies  GERD (gastroesophageal reflux disease)  Hypertension   Past Surgical History:  Past Surgical History:  Procedure Laterality Date  Back Surgery 1988  ARTHROPLASTY TOTAL KNEE Right 05/2013  Left carpal tunnel release 07/20/2017  Dr Marry Guan  Left ring trigger finger release Left 05/10/2022  Dr. Rudene Christians   Current Medications:  Current Outpatient Medications  Medication Sig Dispense Refill  acetaminophen (TYLENOL) 650 MG ER tablet Take 1,300 mg by mouth as needed for Pain  albuterol 90 mcg/actuation inhaler Inhale 2 inhalations into the lungs every 6 (six) hours as needed  b complex vitamins tablet Take 1 tablet by mouth once daily  cholecalciferol (VITAMIN D3) 2,000 unit tablet Take 4,000 Units by mouth once daily 2 GUMMY A DAY  famotidine (PEPCID) 20 MG tablet Take 20  mg by mouth as needed  FLUoxetine (PROZAC) 20 MG tablet Take 20 mg by mouth once daily  fluticasone propionate (FLONASE) 50 mcg/actuation nasal spray Place 2 sprays into both nostrils once daily  levothyroxine (SYNTHROID) 150 MCG tablet Take 150 mcg by mouth once daily  losartan (COZAAR) 100 MG tablet Take 100 mg by mouth once daily  MYRBETRIQ 25 mg ER Tablet Take 50 mg by mouth once daily  rosuvastatin (CRESTOR) 10 MG tablet Take 1 tablet by mouth once daily   No current facility-administered medications for this visit.   Allergies: No Known Allergies  Social History:  Social History   Socioeconomic History  Marital status: Widowed  Number of children: 1  Years of education: 103  Occupational History  Occupation: Retired Web designer  Tobacco Use  Smoking status: Never  Smokeless tobacco: Never  Surveyor, mining Use: Never used  Substance and Sexual Activity  Alcohol use: No  Drug use: No  Sexual activity: Defer  Partners: Male   Family History:  Family History  Problem Relation Age of Onset  Colon cancer Mother  Myocardial Infarction (Heart attack) Father  Rheum arthritis Sister  Rheum arthritis Brother   Review of Systems:   A 10+ ROS was performed, reviewed, and the pertinent orthopaedic findings are documented in the HPI.   Physical Examination:   BP (!) 140/88 (BP Location: Left upper arm, Patient Position: Sitting, BP Cuff Size: Large Adult)  Ht 167.6 cm ('5\' 6"'$ )  Wt (!) 106.1 kg (234 lb)  BMI 37.77 kg/m   Patient is a well-developed, well-nourished female in no acute distress. Patient has normal mood  and affect. Patient is alert and oriented to person, place, and time.   HEENT: Atraumatic, normocephalic. Pupils equal and reactive to light. Extraocular motion intact. Noninjected sclera.  Cardiovascular: Regular rate and rhythm, with no murmurs, rubs, or gallops. Distal pulses palpable.  Respiratory: Lungs clear to auscultation bilaterally.   Right  hand:  Tenderness: Negative Erythema: negative Swelling: negative Capillary Refill: normal Thenar atrophy: positive Intrinsic wasting: negative Grip strength: fair to good grip strength Pincer strength: fair to good pincer strength Tinel`s test: positive Phalen`s test: positive Triggering: No gross triggering or locking of the digits Finkelstein`s test: negative Range of motion: Good range of motion of the digits  Tests Performed/Reviewed:  X-rays  No new radiographs were obtained today.   EMG/NCV 06/25/17 Exam: Decreased strength and sensation in both hands in a patchy distribution.   EMG & NCV Findings: Evaluation of the Left median motor and the Right median motor nerves showed prolonged distal onset latency (L9.1, R11.2 ms) and reduced amplitude (L1.9, R0.7 mV). The Right median sensory and the Right ulnar sensory nerves showed no response (Wrist). The Left ulnar sensory nerve showed prolonged distal peak latency (5.9 ms) and decreased conduction velocity (Wrist-5th Digit, 20 m/s). The Left median/ulnar (palm) comparison and the Right median/ulnar (palm) comparison nerves showed no response (Median Palm). All remaining nerves (as indicated in the following tables) were within normal limits.   EMG  Side Muscle Nerve Root Ins Act Fibs Psw Amp Dur Poly Recrt Int Fraser Din Comment  Left Abd Poll Brev Median C8-T1 Nml Nml Nml Incr >60m 1+ Nml Nml  Left 1stDorInt Ulnar C8-T1 Nml Nml Nml Nml Nml 0 Nml Nml  Left PronatorTeres Median C6-7 Nml Nml Nml Nml Nml 0 Nml Nml  Left Biceps Musculocut C5-6 Nml Nml Nml Nml Nml 0 Nml Nml  Left Triceps Radial C6-7-8 Nml Nml Nml Nml Nml 0 Nml Nml  Right Abd Poll Brev Median C8-T1 Nml Nml Nml Incr >111m1+ Nml Nml  Right 1stDorInt Ulnar C8-T1 Nml Nml Nml Nml Nml 0 Nml Nml  Right PronatorTeres Median C6-7 Nml Nml Nml Nml Nml 0 Nml Nml    Impression: Abnormal study. There is electrodiagnostic of chronic, severe, bilateral carpal tunnel syndrome and mild  to moderate, bilateral, sensory ulnar neuropathies.  Impression:   ICD-10-CM  1. Carpal tunnel syndrome of right wrist G56.01   Plan:   -right carpal tunnel syndrome The patient will benefit from a right carpal tunnel release. The risks of surgery, including infection and blood clots, were discussed with the patient. Having failed conservative treatment, the patient has elected to proceed with surgery. The risks of surgery, including blood clots and infection, were discussed with the patient. Measures taken to reduce these risks were also discussed with the patient. The postoperative course was discussed with the patient. Patient was instructed to stop all blood thinners prior to surgery. Patient understands and elects to proceed with surgery with Dr. HoMarry Guan Contact our office with any questions or concerns. Follow up as indicated, or sooner should any new problems arise, if conditions worsen, or if they are otherwise concerned.   BeGwenlyn FudgePA-C [facility] [address] [place] [id number]: [phone] note was generated in part with voice recognition software and I apologize for any typographical errors that were not detected and corrected.  Electronically signed by SmGwenlyn FudgePA at 05/30/2022 2:35 PM EDT

## 2022-06-05 ENCOUNTER — Ambulatory Visit: Payer: Medicare PPO | Admitting: Anesthesiology

## 2022-06-05 ENCOUNTER — Encounter: Payer: Self-pay | Admitting: Orthopedic Surgery

## 2022-06-05 ENCOUNTER — Other Ambulatory Visit: Payer: Self-pay

## 2022-06-05 ENCOUNTER — Ambulatory Visit
Admission: RE | Admit: 2022-06-05 | Discharge: 2022-06-05 | Disposition: A | Discharge status: Home / Self Care | Payer: Medicare PPO | Attending: Orthopedic Surgery | Admitting: Orthopedic Surgery

## 2022-06-05 ENCOUNTER — Encounter
Admission: RE | Disposition: A | Discharge status: Home / Self Care | Payer: Self-pay | Source: Home / Self Care | Attending: Orthopedic Surgery

## 2022-06-05 DIAGNOSIS — K219 Gastro-esophageal reflux disease without esophagitis: Secondary | ICD-10-CM | POA: Diagnosis not present

## 2022-06-05 DIAGNOSIS — I10 Essential (primary) hypertension: Secondary | ICD-10-CM | POA: Insufficient documentation

## 2022-06-05 DIAGNOSIS — F419 Anxiety disorder, unspecified: Secondary | ICD-10-CM | POA: Diagnosis not present

## 2022-06-05 DIAGNOSIS — G4730 Sleep apnea, unspecified: Secondary | ICD-10-CM | POA: Diagnosis not present

## 2022-06-05 DIAGNOSIS — Z8261 Family history of arthritis: Secondary | ICD-10-CM | POA: Diagnosis not present

## 2022-06-05 DIAGNOSIS — Z6837 Body mass index (BMI) 37.0-37.9, adult: Secondary | ICD-10-CM | POA: Diagnosis not present

## 2022-06-05 DIAGNOSIS — F32A Depression, unspecified: Secondary | ICD-10-CM | POA: Insufficient documentation

## 2022-06-05 DIAGNOSIS — E039 Hypothyroidism, unspecified: Secondary | ICD-10-CM | POA: Insufficient documentation

## 2022-06-05 DIAGNOSIS — Z8249 Family history of ischemic heart disease and other diseases of the circulatory system: Secondary | ICD-10-CM | POA: Diagnosis not present

## 2022-06-05 DIAGNOSIS — M1990 Unspecified osteoarthritis, unspecified site: Secondary | ICD-10-CM | POA: Insufficient documentation

## 2022-06-05 DIAGNOSIS — G5601 Carpal tunnel syndrome, right upper limb: Secondary | ICD-10-CM | POA: Diagnosis not present

## 2022-06-05 DIAGNOSIS — E669 Obesity, unspecified: Secondary | ICD-10-CM | POA: Insufficient documentation

## 2022-06-05 DIAGNOSIS — Z9889 Other specified postprocedural states: Secondary | ICD-10-CM

## 2022-06-05 HISTORY — PX: CARPAL TUNNEL RELEASE: SHX101

## 2022-06-05 SURGERY — CARPAL TUNNEL RELEASE
Anesthesia: General | Site: Wrist | Laterality: Right

## 2022-06-05 MED ORDER — PROPOFOL 10 MG/ML IV BOLUS
INTRAVENOUS | Status: AC
  Filled 2022-06-05: qty 20

## 2022-06-05 MED ORDER — BUPIVACAINE HCL (PF) 0.25 % IJ SOLN
INTRAMUSCULAR | Status: AC
  Filled 2022-06-05: qty 30

## 2022-06-05 MED ORDER — MORPHINE SULFATE (PF) 2 MG/ML IV SOLN: 0.5000 mg | INTRAVENOUS | Status: DC | PRN

## 2022-06-05 MED ORDER — ORAL CARE MOUTH RINSE: 15.0000 mL | Freq: Once | OROMUCOSAL | Status: AC

## 2022-06-05 MED ORDER — FENTANYL CITRATE (PF) 100 MCG/2ML IJ SOLN: 25.0000 ug | INTRAMUSCULAR | Status: DC | PRN

## 2022-06-05 MED ORDER — HYDROCODONE-ACETAMINOPHEN 7.5-325 MG PO TABS: 1.0000 | ORAL_TABLET | ORAL | Status: DC | PRN

## 2022-06-05 MED ORDER — CEFAZOLIN SODIUM-DEXTROSE 2-4 GM/100ML-% IV SOLN
INTRAVENOUS | Status: AC
  Filled 2022-06-05: qty 100

## 2022-06-05 MED ORDER — PROPOFOL 10 MG/ML IV BOLUS
INTRAVENOUS | Status: DC | PRN
  Administered 2022-06-05: 80 mg via INTRAVENOUS
  Administered 2022-06-05: 70 mg via INTRAVENOUS
  Administered 2022-06-05: 50 mg via INTRAVENOUS
  Administered 2022-06-05: 120 mg via INTRAVENOUS

## 2022-06-05 MED ORDER — CHLORHEXIDINE GLUCONATE 0.12 % MT SOLN
OROMUCOSAL | Status: AC
  Administered 2022-06-05: 15 mL via OROMUCOSAL
  Filled 2022-06-05: qty 15

## 2022-06-05 MED ORDER — HYDROCODONE-ACETAMINOPHEN 5-325 MG PO TABS: 1.0000 | ORAL_TABLET | ORAL | Status: DC | PRN

## 2022-06-05 MED ORDER — ONDANSETRON HCL 4 MG/2ML IJ SOLN: 4.0000 mg | Freq: Once | INTRAMUSCULAR | Status: DC | PRN

## 2022-06-05 MED ORDER — CELECOXIB 200 MG PO CAPS
ORAL_CAPSULE | ORAL | Status: AC
  Administered 2022-06-05: 400 mg via ORAL
  Filled 2022-06-05: qty 2

## 2022-06-05 MED ORDER — ACETAMINOPHEN 10 MG/ML IV SOLN: 1000.0000 mg | Freq: Once | INTRAVENOUS | Status: DC | PRN

## 2022-06-05 MED ORDER — LACTATED RINGERS IV SOLN: INTRAVENOUS | Status: DC

## 2022-06-05 MED ORDER — FENTANYL CITRATE (PF) 100 MCG/2ML IJ SOLN
INTRAMUSCULAR | Status: DC | PRN
  Administered 2022-06-05 (×2): 50 ug via INTRAVENOUS

## 2022-06-05 MED ORDER — OXYCODONE HCL 5 MG/5ML PO SOLN: 5.0000 mg | Freq: Once | ORAL | Status: DC | PRN

## 2022-06-05 MED ORDER — ACETAMINOPHEN 325 MG PO TABS: 325.0000 mg | ORAL_TABLET | Freq: Four times a day (QID) | ORAL | Status: DC | PRN

## 2022-06-05 MED ORDER — CEFAZOLIN SODIUM-DEXTROSE 2-4 GM/100ML-% IV SOLN
2.0000 g | INTRAVENOUS | Status: AC
  Administered 2022-06-05: 2 g via INTRAVENOUS

## 2022-06-05 MED ORDER — CELECOXIB 200 MG PO CAPS: 400.0000 mg | ORAL_CAPSULE | Freq: Once | ORAL | Status: AC

## 2022-06-05 MED ORDER — METOCLOPRAMIDE HCL 10 MG PO TABS: 5.0000 mg | ORAL_TABLET | Freq: Three times a day (TID) | ORAL | Status: DC | PRN

## 2022-06-05 MED ORDER — ONDANSETRON HCL 4 MG/2ML IJ SOLN
INTRAMUSCULAR | Status: AC
  Filled 2022-06-05: qty 2

## 2022-06-05 MED ORDER — OXYCODONE HCL 5 MG PO TABS: 5.0000 mg | ORAL_TABLET | Freq: Once | ORAL | Status: DC | PRN

## 2022-06-05 MED ORDER — EPHEDRINE SULFATE (PRESSORS) 50 MG/ML IJ SOLN
INTRAMUSCULAR | Status: DC | PRN
  Administered 2022-06-05: 10 mg via INTRAVENOUS

## 2022-06-05 MED ORDER — LIDOCAINE HCL (CARDIAC) PF 100 MG/5ML IV SOSY
PREFILLED_SYRINGE | INTRAVENOUS | Status: DC | PRN
  Administered 2022-06-05: 80 mg via INTRAVENOUS

## 2022-06-05 MED ORDER — DEXAMETHASONE SODIUM PHOSPHATE 10 MG/ML IJ SOLN
INTRAMUSCULAR | Status: AC
  Filled 2022-06-05: qty 1

## 2022-06-05 MED ORDER — ONDANSETRON HCL 4 MG/2ML IJ SOLN
INTRAMUSCULAR | Status: DC | PRN
  Administered 2022-06-05: 4 mg via INTRAVENOUS

## 2022-06-05 MED ORDER — DEXAMETHASONE SODIUM PHOSPHATE 10 MG/ML IJ SOLN
INTRAMUSCULAR | Status: DC | PRN
  Administered 2022-06-05: 10 mg via INTRAVENOUS

## 2022-06-05 MED ORDER — SUCCINYLCHOLINE CHLORIDE 200 MG/10ML IV SOSY
PREFILLED_SYRINGE | INTRAVENOUS | Status: DC | PRN
  Administered 2022-06-05: 100 mg via INTRAVENOUS

## 2022-06-05 MED ORDER — EPHEDRINE 5 MG/ML INJ
INTRAVENOUS | Status: AC
  Filled 2022-06-05: qty 5

## 2022-06-05 MED ORDER — CHLORHEXIDINE GLUCONATE 0.12 % MT SOLN: 15.0000 mL | Freq: Once | OROMUCOSAL | Status: AC

## 2022-06-05 MED ORDER — ONDANSETRON HCL 4 MG/2ML IJ SOLN: 4.0000 mg | Freq: Four times a day (QID) | INTRAMUSCULAR | Status: DC | PRN

## 2022-06-05 MED ORDER — MIDAZOLAM HCL 2 MG/2ML IJ SOLN
INTRAMUSCULAR | Status: DC | PRN
  Administered 2022-06-05: 2 mg via INTRAVENOUS

## 2022-06-05 MED ORDER — METOCLOPRAMIDE HCL 5 MG/ML IJ SOLN: 5.0000 mg | Freq: Three times a day (TID) | INTRAMUSCULAR | Status: DC | PRN

## 2022-06-05 MED ORDER — FENTANYL CITRATE (PF) 100 MCG/2ML IJ SOLN
INTRAMUSCULAR | Status: AC
  Filled 2022-06-05: qty 2

## 2022-06-05 MED ORDER — SODIUM CHLORIDE 0.9 % IR SOLN
Status: DC | PRN
  Administered 2022-06-05: 100 mL

## 2022-06-05 MED ORDER — NEOMYCIN-POLYMYXIN B GU 40-200000 IR SOLN
Status: AC
  Filled 2022-06-05: qty 20

## 2022-06-05 MED ORDER — HYDROCODONE-ACETAMINOPHEN 5-325 MG PO TABS: 1.0000 | ORAL_TABLET | ORAL | 0 refills | Status: DC | PRN

## 2022-06-05 MED ORDER — BUPIVACAINE HCL (PF) 0.25 % IJ SOLN
INTRAMUSCULAR | Status: DC | PRN
  Administered 2022-06-05: 10 mL

## 2022-06-05 MED ORDER — MIDAZOLAM HCL 2 MG/2ML IJ SOLN
INTRAMUSCULAR | Status: AC
  Filled 2022-06-05: qty 2

## 2022-06-05 MED ORDER — ONDANSETRON HCL 4 MG PO TABS: 4.0000 mg | ORAL_TABLET | Freq: Four times a day (QID) | ORAL | Status: DC | PRN

## 2022-06-05 SURGICAL SUPPLY — 30 items
BNDG CMPR STD VLCR NS LF 5.8X3 (GAUZE/BANDAGES/DRESSINGS) ×1
BNDG ELASTIC 3X5.8 VLCR NS LF (GAUZE/BANDAGES/DRESSINGS) ×1 IMPLANT
BNDG ESMARK 4X12 TAN STRL LF (GAUZE/BANDAGES/DRESSINGS) ×1 IMPLANT
CUFF TOURN SGL QUICK 18X4 (TOURNIQUET CUFF) ×1 IMPLANT
DRAPE U-SHAPE 47X51 STRL (DRAPES) ×1 IMPLANT
DRSG DERMACEA NONADH 3X8 (GAUZE/BANDAGES/DRESSINGS) ×1 IMPLANT
DURAPREP 26ML APPLICATOR (WOUND CARE) ×1 IMPLANT
ELECT CAUTERY BLADE 6.4 (BLADE) ×1 IMPLANT
ELECT REM PT RETURN 9FT ADLT (ELECTROSURGICAL) ×1
ELECTRODE REM PT RTRN 9FT ADLT (ELECTROSURGICAL) ×1 IMPLANT
GAUZE SPONGE 4X4 12PLY STRL (GAUZE/BANDAGES/DRESSINGS) ×1 IMPLANT
GLOVE BIOGEL M STRL SZ7.5 (GLOVE) ×1 IMPLANT
GLOVE SURG UNDER LTX SZ8 (GLOVE) ×1 IMPLANT
GOWN STRL REUS W/ TWL LRG LVL3 (GOWN DISPOSABLE) ×2 IMPLANT
GOWN STRL REUS W/TWL LRG LVL3 (GOWN DISPOSABLE) ×2
KIT TURNOVER KIT A (KITS) ×1 IMPLANT
MANIFOLD NEPTUNE II (INSTRUMENTS) ×1 IMPLANT
NS IRRIG 500ML POUR BTL (IV SOLUTION) ×1 IMPLANT
PACK EXTREMITY ARMC (MISCELLANEOUS) ×1 IMPLANT
PAD CAST 3X4 CTTN HI CHSV (CAST SUPPLIES) ×1 IMPLANT
PADDING CAST COTTON 3X4 STRL (CAST SUPPLIES) ×1
PADDING WEBRIL 3 STERILE (GAUZE/BANDAGES/DRESSINGS) IMPLANT
SOL PREP PVP 2OZ (MISCELLANEOUS) ×1
SOLUTION PREP PVP 2OZ (MISCELLANEOUS) ×1 IMPLANT
SPLINT CAST 1 STEP 3X12 (MISCELLANEOUS) ×1 IMPLANT
STOCKINETTE 48X4 2 PLY STRL (GAUZE/BANDAGES/DRESSINGS) ×1 IMPLANT
STOCKINETTE STRL 4IN 9604848 (GAUZE/BANDAGES/DRESSINGS) ×1 IMPLANT
SUT ETHILON 5-0 FS-2 18 BLK (SUTURE) ×1 IMPLANT
TRAP FLUID SMOKE EVACUATOR (MISCELLANEOUS) ×1 IMPLANT
WATER STERILE IRR 500ML POUR (IV SOLUTION) ×1 IMPLANT

## 2022-06-05 NOTE — Anesthesia Postprocedure Evaluation (Signed)
Anesthesia Post Note  Patient: Tiffany Delacruz  Procedure(s) Performed: CARPAL TUNNEL RELEASE (Right: Wrist)  Patient location during evaluation: PACU Anesthesia Type: General Level of consciousness: awake and alert, oriented and patient cooperative Pain management: pain level controlled Vital Signs Assessment: post-procedure vital signs reviewed and stable Respiratory status: spontaneous breathing, nonlabored ventilation and respiratory function stable Cardiovascular status: blood pressure returned to baseline and stable Postop Assessment: adequate PO intake Anesthetic complications: no   No notable events documented.   Last Vitals:  Vitals:   06/05/22 1330 06/05/22 1401  BP: (!) 160/68 (!) 165/69  Pulse: 77 71  Resp: 10 10  Temp: (!) 36.2 C (!) 36.4 C  SpO2: 95% 95%    Last Pain:  Vitals:   06/05/22 1401  TempSrc: Temporal  PainSc: 0-No pain                 Darrin Nipper

## 2022-06-05 NOTE — Interval H&P Note (Signed)
History and Physical Interval Note:  06/05/2022 11:20 AM  Tiffany Delacruz  has presented today for surgery, with the diagnosis of CARPAL TUNNEL SYNDROME, RIGHT.  The various methods of treatment have been discussed with the patient and family. After consideration of risks, benefits and other options for treatment, the patient has consented to  Procedure(s): CARPAL TUNNEL RELEASE (Right) as a surgical intervention.  The patient's history has been reviewed, patient examined, no change in status, stable for surgery.  I have reviewed the patient's chart and labs.  Questions were answered to the patient's satisfaction.     [place]

## 2022-06-05 NOTE — Transfer of Care (Signed)
Immediate Anesthesia Transfer of Care Note  Patient: Tiffany Delacruz  Procedure(s) Performed: CARPAL TUNNEL RELEASE (Right: Wrist)  Patient Location: PACU  Anesthesia Type:General  Level of Consciousness: drowsy  Airway & Oxygen Therapy: Patient Spontanous Breathing and Patient connected to face mask oxygen  Post-op Assessment: Report given to RN, Post -op Vital signs reviewed and stable and Patient moving all extremities  Post vital signs: Reviewed and stable  Last Vitals:  Vitals Value Taken Time  BP 155/67 06/05/22 1303  Temp 36.3 C 06/05/22 1303  Pulse 80 06/05/22 1306  Resp 13 06/05/22 1306  SpO2 100 % 06/05/22 1306  Vitals shown include unvalidated device data.  Last Pain:  Vitals:   06/05/22 1016  TempSrc: Temporal  PainSc: 0-No pain         Complications: No notable events documented.

## 2022-06-05 NOTE — Anesthesia Procedure Notes (Signed)
Procedure Name: Intubation Date/Time: 06/05/2022 12:01 PM  Performed by: Esaw Grandchild, CRNAPre-anesthesia Checklist: Patient identified, Emergency Drugs available, Suction available and Patient being monitored Patient Re-evaluated:Patient Re-evaluated prior to induction Oxygen Delivery Method: Circle system utilized Preoxygenation: Pre-oxygenation with 100% oxygen Induction Type: IV induction Ventilation: Mask ventilation without difficulty and Oral airway inserted - appropriate to patient size Laryngoscope Size: McGraph and 3 Grade View: Grade I Tube type: Oral Tube size: 7.0 mm Number of attempts: 1 Airway Equipment and Method: Stylet and Oral airway Placement Confirmation: ETT inserted through vocal cords under direct vision, positive ETCO2 and breath sounds checked- equal and bilateral Secured at: 20 cm Tube secured with: Tape Dental Injury: Teeth and Oropharynx as per pre-operative assessment  Comments: 1st attempt w/LMA unsuccessful d/t unable to obtain good seal, significant air leak, decision made to intubate w/Dr. Erenest Rasher, Pt tolerated induction/intubation w/o issues

## 2022-06-05 NOTE — Op Note (Signed)
OPERATIVE NOTE  DATE OF SURGERY:  06/05/2022  PATIENT NAME:  Tiffany Delacruz   DOB: December 07, 1950  MRN: [id number]  PRE-OPERATIVE DIAGNOSIS: Right carpal tunnel syndrome  POST-OPERATIVE DIAGNOSIS:  Same  PROCEDURE:  Right carpal tunnel release  SURGEON:  Marciano Sequin. M.D.  ANESTHESIA: general  ESTIMATED BLOOD LOSS: Minimal  FLUIDS REPLACED: 700 mL of crystalloid  TOURNIQUET TIME: 29 minutes  DRAINS: None  INDICATIONS FOR SURGERY: [name] is a 71 y.o. year old female with a long history of numbness and paresthesias to the right hand. EMG/nerve conduction studies demonstrated findings consistent with carpal tunnel syndrome.The patient had not seen any significant improvement despite conservative nonsurgical intervention. After discussion of the risks and benefits of surgical intervention, the patient expressed understanding of the risks benefits and agree with plans for carpal tunnel release.   PROCEDURE IN DETAIL: The patient was brought into the operating room and after adequate general anesthesia, a tourniquet was placed on the patient's right upper arm.The right hand and arm were prepped with alcohol and Duraprep and draped in the usual sterile fashion. A "time-out" was performed as per usual protocol. The hand and forearm were exsanguinated using an Esmarch and the tourniquet was inflated to 250 mmHg. Loupe magnification was used throughout the procedure. An incision was made just ulnar to the thenar palmar crease. Dissection was carried down through the palmar fascia to the transverse carpal ligament. The transverse carpal ligament was sharply incised, taking care to protect the underlying structures with the carpal tunnel. Complete release of the transverse carpal ligament was achieved. There was no evidence of ganglion cyst or lipoma within the carpal tunnel. The wound was irrigated with copious amounts of normal saline with antibiotic solution. The skin was then  re-approximated with interrupted sutures of #5-0 nylon. A sterile dressing was applied followed by application of a volar splint. The tourniquet was deflated with a total tourniquet time of 29 minutes.  The patient tolerated the procedure well and was transported to the PACU in stable condition.  [name], M.D.

## 2022-06-05 NOTE — Anesthesia Preprocedure Evaluation (Addendum)
Anesthesia Evaluation  Patient identified by MRN, date of birth, ID band Patient awake    Reviewed: Allergy & Precautions, NPO status , Patient's Chart, lab work & pertinent test results  History of Anesthesia Complications Negative for: history of anesthetic complications  Airway Mallampati: IV   Neck ROM: Full    Dental  (+) Lower Dentures, Upper Dentures   Pulmonary sleep apnea and Continuous Positive Airway Pressure Ventilation ,    Pulmonary exam normal breath sounds clear to auscultation       Cardiovascular hypertension, Normal cardiovascular exam Rhythm:Regular Rate:Normal  ECG 05/30/22: normal   Neuro/Psych PSYCHIATRIC DISORDERS Anxiety Depression negative neurological ROS     GI/Hepatic GERD  ,  Endo/Other  Hypothyroidism Obesity   Renal/GU negative Renal ROS     Musculoskeletal  (+) Arthritis ,   Abdominal   Peds  Hematology negative hematology ROS (+)   Anesthesia Other Findings   Reproductive/Obstetrics                            Anesthesia Physical Anesthesia Plan  ASA: 2  Anesthesia Plan: General   Post-op Pain Management:    Induction: Intravenous  PONV Risk Score and Plan: 3 and Ondansetron, Dexamethasone and Treatment may vary due to age or medical condition  Airway Management Planned: LMA  Additional Equipment:   Intra-op Plan:   Post-operative Plan: Extubation in OR  Informed Consent: I have reviewed the patients History and Physical, chart, labs and discussed the procedure including the risks, benefits and alternatives for the proposed anesthesia with the patient or authorized representative who has indicated his/her understanding and acceptance.     Dental advisory given  Plan Discussed with: CRNA  Anesthesia Plan Comments: (Patient consented for risks of anesthesia including but not limited to:  - adverse reactions to medications - damage to eyes,  teeth, lips or other oral mucosa - nerve damage due to positioning  - sore throat or hoarseness - damage to heart, brain, nerves, lungs, other parts of body or loss of life  Informed patient about role of CRNA in peri- and intra-operative care.  Patient voiced understanding.)        Anesthesia Quick Evaluation

## 2022-06-06 ENCOUNTER — Encounter: Payer: Self-pay | Admitting: Orthopedic Surgery

## 2022-06-08 ENCOUNTER — Telehealth: Payer: Self-pay

## 2022-06-08 NOTE — Telephone Encounter (Signed)
Called patient to schedule AWV. Patient states she does not want to schedule at this time.

## 2022-08-28 ENCOUNTER — Encounter: Payer: Self-pay | Admitting: Nurse Practitioner

## 2022-08-29 MED ORDER — FLUOXETINE HCL 40 MG PO CAPS: 40.0000 mg | ORAL_CAPSULE | Freq: Every day | ORAL | 4 refills | Status: DC

## 2022-10-02 NOTE — Patient Instructions (Signed)

## 2022-10-04 ENCOUNTER — Encounter: Payer: Self-pay | Admitting: Nurse Practitioner

## 2022-10-04 ENCOUNTER — Ambulatory Visit: Payer: Medicare PPO | Admitting: Nurse Practitioner

## 2022-10-04 VITALS — BP 110/70 | HR 60 | Temp 97.6°F | Ht 64.69 in | Wt 240.6 lb

## 2022-10-04 DIAGNOSIS — I2089 Other forms of angina pectoris: Secondary | ICD-10-CM

## 2022-10-04 DIAGNOSIS — E782 Mixed hyperlipidemia: Secondary | ICD-10-CM

## 2022-10-04 DIAGNOSIS — Z23 Encounter for immunization: Secondary | ICD-10-CM

## 2022-10-04 DIAGNOSIS — N3281 Overactive bladder: Secondary | ICD-10-CM

## 2022-10-04 DIAGNOSIS — I700 Atherosclerosis of aorta: Secondary | ICD-10-CM | POA: Diagnosis not present

## 2022-10-04 DIAGNOSIS — Z6837 Body mass index (BMI) 37.0-37.9, adult: Secondary | ICD-10-CM

## 2022-10-04 DIAGNOSIS — E039 Hypothyroidism, unspecified: Secondary | ICD-10-CM

## 2022-10-04 DIAGNOSIS — I10 Essential (primary) hypertension: Secondary | ICD-10-CM

## 2022-10-04 DIAGNOSIS — G4733 Obstructive sleep apnea (adult) (pediatric): Secondary | ICD-10-CM | POA: Diagnosis not present

## 2022-10-04 DIAGNOSIS — F3342 Major depressive disorder, recurrent, in full remission: Secondary | ICD-10-CM | POA: Diagnosis not present

## 2022-10-04 DIAGNOSIS — R7301 Impaired fasting glucose: Secondary | ICD-10-CM

## 2022-10-04 DIAGNOSIS — Z0000 Encounter for general adult medical examination without abnormal findings: Secondary | ICD-10-CM | POA: Diagnosis not present

## 2022-10-04 LAB — MICROALBUMIN, URINE WAIVED
Creatinine, Urine Waived: 200 mg/dL (ref 10–300)
Microalb, Ur Waived: 30 mg/L — ABNORMAL HIGH (ref 0–19)
Microalb/Creat Ratio: <30 mg/g (ref <30)

## 2022-10-04 MED ORDER — NITROGLYCERIN 0.4 MG SL SUBL: 0.4000 mg | SUBLINGUAL_TABLET | SUBLINGUAL | 2 refills | Status: AC | PRN

## 2022-10-04 MED ORDER — CHLORTHALIDONE 25 MG PO TABS: 12.5000 mg | ORAL_TABLET | Freq: Every day | ORAL | 2 refills | Status: DC

## 2022-10-04 NOTE — Assessment & Plan Note (Signed)
Chronic, ongoing.  Not using CPAP, refuses due to claustrophobia with this.

## 2022-10-04 NOTE — Assessment & Plan Note (Signed)
BMI 40.43.  Recommend to continue this.  Recommended eating smaller high protein, low fat meals more frequently and exercising 30 mins a day 5 times a week with a goal of 10-15lb weight loss in the next 3 months. Patient voiced their understanding and motivation to adhere to these recommendations.

## 2022-10-04 NOTE — Assessment & Plan Note (Signed)
Chronic, stable with BP at goal today.  Have recommend she not take extra medication without alerting PCP first in future.  Recommend she monitor BP at least a few mornings a week at home and document.  DASH diet at home.  Continue current medication regimen and adjust as needed -- will send in Chlorthalidone to take 1/2 tablet if BP consistently >130/80 + alert PCP if initiates.  Labs today: CBC, CMP and urine ALB.  Return in 6 months.

## 2022-10-04 NOTE — Assessment & Plan Note (Signed)
Chronic, ongoing.  Continue current Levothyroxine dose and adjust as needed.  Obtain TSH and Free T4 today.

## 2022-10-04 NOTE — Assessment & Plan Note (Signed)
Chronic.  Noted on past imaging, recommend she take a Baby ASA daily and continue statin therapy.  Check lipid panel non fasting.

## 2022-10-04 NOTE — Assessment & Plan Note (Addendum)
Chronic and stable with no recent CP or NTG use.  She wishes to return to cardiology for visit, will place new referral.

## 2022-10-04 NOTE — Assessment & Plan Note (Signed)
A1c 5.2% last visit and urine ALB 30 (January 2024).  Praised for ongoing success.  Continue diet focus as remains out of prediabetic range.

## 2022-10-04 NOTE — Assessment & Plan Note (Signed)
Chronic, ongoing.  Continue current medication regimen and adjust as needed. Lipid panel today. 

## 2022-10-04 NOTE — Assessment & Plan Note (Signed)
Chronic, stable.  Denies SI/HI.  Currently has been off medication for over one year, but has refills in case of occasional depressed mood, would like to have medication on hand as needed.

## 2022-10-04 NOTE — Assessment & Plan Note (Signed)
Chronic, stable.  Will continue Myrbetriq 50 MG daily, discussed this is safest option in her age group -- is having improved symptoms with this.  Return in 6 months for follow-up.  Consider urology referral if ongoing.
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
## 2022-10-05 LAB — COMPREHENSIVE METABOLIC PANEL
ALT: 14 IU/L (ref 0–32)
AST: 12 IU/L (ref 0–40)
Albumin/Globulin Ratio: 1.9 (ref 1.2–2.2)
Albumin: 4.1 g/dL (ref 3.8–4.8)
Alkaline Phosphatase: 77 IU/L (ref 44–121)
BUN/Creatinine Ratio: 33 — ABNORMAL HIGH (ref 12–28)
BUN: 28 mg/dL — ABNORMAL HIGH (ref 8–27)
Bilirubin Total: 1 mg/dL (ref 0.0–1.2)
CO2: 25 mmol/L (ref 20–29)
Calcium: 9.4 mg/dL (ref 8.7–10.3)
Chloride: 100 mmol/L (ref 96–106)
Creatinine, Ser: 0.84 mg/dL (ref 0.57–1.00)
Globulin, Total: 2.2 g/dL (ref 1.5–4.5)
Glucose: 115 mg/dL — ABNORMAL HIGH (ref 70–99)
Potassium: 4 mmol/L (ref 3.5–5.2)
Sodium: 140 mmol/L (ref 134–144)
Total Protein: 6.3 g/dL (ref 6.0–8.5)
eGFR: 73 mL/min/{1.73_m2} (ref >59)

## 2022-10-05 LAB — CBC WITH DIFFERENTIAL/PLATELET
Basophils Absolute: 0 10*3/uL (ref 0.0–0.2)
Basos: 1 %
EOS (ABSOLUTE): 0.3 10*3/uL (ref 0.0–0.4)
Eos: 4 %
Hematocrit: 40.2 % (ref 34.0–46.6)
Hemoglobin: 13.2 g/dL (ref 11.1–15.9)
Immature Grans (Abs): 0 10*3/uL (ref 0.0–0.1)
Immature Granulocytes: 0 %
Lymphocytes Absolute: 1.8 10*3/uL (ref 0.7–3.1)
Lymphs: 30 %
MCH: 28.3 pg (ref 26.6–33.0)
MCHC: 32.8 g/dL (ref 31.5–35.7)
MCV: 86 fL (ref 79–97)
Monocytes Absolute: 0.5 10*3/uL (ref 0.1–0.9)
Monocytes: 8 %
Neutrophils Absolute: 3.4 10*3/uL (ref 1.4–7.0)
Neutrophils: 57 %
Platelets: 190 10*3/uL (ref 150–450)
RBC: 4.66 x10E6/uL (ref 3.77–5.28)
RDW: 13.3 % (ref 11.7–15.4)
WBC: 6 10*3/uL (ref 3.4–10.8)

## 2022-10-05 LAB — LIPID PANEL W/O CHOL/HDL RATIO
Cholesterol, Total: 141 mg/dL (ref 100–199)
HDL: 54 mg/dL (ref >39)
LDL Chol Calc (NIH): 68 mg/dL (ref 0–99)
Triglycerides: 101 mg/dL (ref 0–149)
VLDL Cholesterol Cal: 19 mg/dL (ref 5–40)

## 2022-10-05 LAB — HEMOGLOBIN A1C
Est. average glucose Bld gHb Est-mCnc: 103 mg/dL
Hgb A1c MFr Bld: 5.2 % (ref 4.8–5.6)

## 2022-10-05 LAB — T4, FREE: Free T4: 1.74 ng/dL (ref 0.82–1.77)

## 2022-10-05 LAB — TSH: TSH: 0.678 u[IU]/mL (ref 0.450–4.500)

## 2022-10-05 NOTE — Progress Notes (Signed)
Contacted via [place] evening Romie Minus, your labs have returned: - Kidney function, creatinine and eGFR, remains normal, as is liver function, AST and ALT.  - Remainder of labs are stable.  No medication changes are needed.  Any questions? Keep being amazing!!  Thank you for allowing me to participate in your care.  I appreciate you. Kindest regards, Kaye Mitro

## 2022-10-09 NOTE — Progress Notes (Signed)
Follow-up Outpatient Visit Date: 10/11/2022  Primary Care Provider: Venita Lick, NP [place] [address]  Chief Complaint: Shortness of breath and fatigue  HPI:  Tiffany Delacruz is a 72 y.o. female with history of hypertension, hyperlipidemia, Graves' disease with subsequent hypothyroidism, obstructive sleep apnea (not compliant with CPAP), GERD, and depression/anxiety, who presents for follow-up of chest pain and fatigue.  I last saw her in 09/2019, at which time she reported persistent dyspnea on exertion but no further chest pain.  Preceding coronary CTA was without CAD.  Subsequent echocardiogram showed normal LVEF and diastolic parameters with borderline elevated PA pressure.  She was lost to follow-up thereafter but has been referred back to [place] by Tiffany Delacruz for evaluation and management of hypertension.  Tiffany Delacruz is most concerned about her exertional dyspnea and fatigue, which seem to be a little bit worse compared to our last visit 3 years ago.  She gets out of breath and tired walking any sort of a distance or even doing chores like dishes or laundry for a few minutes.  She has been more aware of this over the last 6 to 8 months but denies an obvious precipitant.  While she denies frank chest pain, she reports sporadic tightness in her chest for which she intermittently uses her albuterol inhaler.  She also tried taking nitroglycerin twice since our last visit, most recently over a year ago, with prompt resolution of the pain.  She notes that her blood pressures at home have been "up-and-down" and was recently placed on chlorthalidone in addition to losartan.  She has yet to begin taking the chlorthalidone.  She has some mild chronic swelling in her legs that she attributes to varicose veins.  She denies palpitations and lightheadedness.  --------------------------------------------------------------------------------------------------  Cardiovascular History &  Procedures: Cardiovascular Problems: Fatigue Chest pain   Risk Factors: Hypertension, hyperlipidemia, obesity, and age greater than 32   Cath/PCI: None   CV Surgery: None   EP Procedures and Devices: None   Non-Invasive Evaluation(s): TTE (11/12/2019): Normal LV size and wall thickness.  LVEF 55-60% with normal wall motion and diastolic function.  Normal RV size and function with mild pulmonary hypertension (per report; RVSP 31 mmHg -> ULN).  No significant valvular abnormalities. Cardiac CTA (10/09/2019): Normal right dominant coronary arteries without CAD.  Coronary calcium score = 0.  Aortic atherosclerosis was noted.  Recent CV Pertinent Labs: Lab Results  Component Value Date   CHOL 141 10/04/2022   CHOL 187 04/16/2015   HDL 54 10/04/2022   LDLCALC 68 10/04/2022   TRIG 101 10/04/2022   TRIG 95 04/16/2015   CHOLHDL 3.6 05/13/2018   INR 1.0 06/02/2013   BNP 29.4 06/27/2019   K 4.0 10/04/2022   K 3.6 06/18/2013   MG 1.7 04/03/2022   BUN 28 (H) 10/04/2022   BUN 14 06/18/2013   CREATININE 0.84 10/04/2022   CREATININE 0.96 06/18/2013    Past medical and surgical history were reviewed and updated in EPIC.  Current Meds  Medication Sig   acetaminophen (TYLENOL) 650 MG CR tablet Take 1,300 mg by mouth every 8 (eight) hours as needed for pain.   albuterol (VENTOLIN HFA) 108 (90 Base) MCG/ACT inhaler TAKE 2 PUFFS BY MOUTH EVERY 6 HOURS AS NEEDED FOR WHEEZE OR SHORTNESS OF BREATH   b complex vitamins tablet Take 1 tablet by mouth daily.   chlorthalidone (HYGROTON) 25 MG tablet Take 0.5 tablets (12.5 mg total) by mouth daily.  famotidine (PEPCID) 20 MG tablet Take 20 mg by mouth as needed.   fluticasone (FLONASE) 50 MCG/ACT nasal spray SPRAY 2 SPRAYS INTO EACH NOSTRIL EVERY DAY (Patient taking differently: as needed. SPRAY 2 SPRAYS INTO EACH NOSTRIL EVERY DAY)   levothyroxine (SYNTHROID) 150 MCG tablet Take 1 tablet (150 mcg total) by mouth daily.   losartan (COZAAR) 100  MG tablet Take 1 tablet (100 mg total) by mouth daily.   mirabegron ER (MYRBETRIQ) 50 MG TB24 tablet Take 1 tablet (50 mg total) by mouth daily.   nitroGLYCERIN (NITROSTAT) 0.4 MG SL tablet Place 1 tablet (0.4 mg total) under the tongue every 5 (five) minutes as needed for chest pain (maximum of 3 doses.).   OVER THE COUNTER MEDICATION as needed. Allergy eye drops   rosuvastatin (CRESTOR) 10 MG tablet Take 1 tablet (10 mg total) by mouth daily.    Allergies: Patient has no known allergies.  Social History   Tobacco Use   Smoking status: Never   Smokeless tobacco: Never  Vaping Use   Vaping Use: Never used  Substance Use Topics   Alcohol use: No    Alcohol/week: 0.0 standard drinks of alcohol   Drug use: No    Family History  Problem Relation Age of Onset   Uterine cancer Mother    Colon cancer Mother    Heart disease Father    Alcohol abuse Father    Heart attack Father 22   Rheum arthritis Sister    Diabetes Maternal Grandmother    Rheum arthritis Brother     Review of Systems: Tiffany Delacruz notes occasional "pulling" sensation in her posterior thighs, usually when sitting down.  She does not have any leg pain with activity.  --------------------------------------------------------------------------------------------------  Physical Exam: BP 112/66 (BP Location: Left Arm, Patient Position: Sitting, Cuff Size: Normal)   Pulse 85   Ht '5\' 6"'$  (1.676 m)   Wt 242 lb 9.6 oz (110 kg)   LMP 12/15/1991 (Approximate)   SpO2 98%   BMI 39.16 kg/m   General:  NAD. Neck: No JVD or HJR. Lungs: Clear to auscultation bilaterally without wheezes or crackles. Heart: Regular rate and rhythm without murmurs, rubs, or gallops. Abdomen: Soft, nontender, nondistended. Extremities: Trace non-pitting edema in both calves.  1+ pretibial pulses bilaterally.  EKG: Normal sinus rhythm without abnormalities.  Lab Results  Component Value Date   WBC 6.0 10/04/2022   HGB 13.2 10/04/2022    HCT 40.2 10/04/2022   MCV 86 10/04/2022   PLT 190 10/04/2022    Lab Results  Component Value Date   NA 140 10/04/2022   K 4.0 10/04/2022   CL 100 10/04/2022   CO2 25 10/04/2022   BUN 28 (H) 10/04/2022   CREATININE 0.84 10/04/2022   GLUCOSE 115 (H) 10/04/2022   ALT 14 10/04/2022    Lab Results  Component Value Date   CHOL 141 10/04/2022   HDL 54 10/04/2022   LDLCALC 68 10/04/2022   TRIG 101 10/04/2022   CHOLHDL 3.6 05/13/2018    --------------------------------------------------------------------------------------------------  ASSESSMENT AND PLAN: Chest tightness and dyspnea on exertion: Mr. Busey has a history of fatigue and exertional dyspnea dating back several years, though this seems to have worsened over the last 6-8 months.  She also notes sporadic tightness in the chest that seems to respond to her albuterol inhaler and sublingual nitroglycerin.  Prior cardiac workup in 2021 was unremarkable with normal coronary CTA and reassuring echocardiogram.  Given progression of her symptoms, we have agreed  to obtain an exercise MPI.  If this is low risk, Tiffany Delacruz will try to increase her activity level, as deconditioning is likely playing a role in her symptoms.  If she does not feel better with increased activity, echocardiogram will need to be considered.  Hypertension: Blood pressure very well-controlled today.  Continue current regimen of losartan and recently added chlorthalidone.  Ongoing follow-up per Tiffany Delacruz.  Leg pain: Pulling sensation in the posterior thighs when seated is not consistent with claudication.  I wonder if it could be due to compression of the sciatic nerve.  We discussed ABIs but have agreed to defer this given atypical nature of her symptoms.  Shared Decision Making/Informed Consent The risks [chest pain, shortness of breath, cardiac arrhythmias, dizziness, blood pressure fluctuations, myocardial infarction, stroke/transient ischemic attack,  nausea, vomiting, allergic reaction, radiation exposure, metallic taste sensation and life-threatening complications (estimated to be 1 in 10,000)], benefits (risk stratification, diagnosing coronary artery disease, treatment guidance) and alternatives of a nuclear stress test were discussed in detail with Tiffany Delacruz and she agrees to proceed.  Follow-up: Return to clinic in 3 months.  Nelva Bush, MD 10/11/2022 9:44 AM

## 2022-10-11 ENCOUNTER — Encounter: Payer: Self-pay | Admitting: Internal Medicine

## 2022-10-11 ENCOUNTER — Ambulatory Visit: Payer: Medicare PPO | Attending: Internal Medicine | Admitting: Internal Medicine

## 2022-10-11 VITALS — BP 112/66 | HR 85 | Ht 66.0 in | Wt 242.6 lb

## 2022-10-11 DIAGNOSIS — R0609 Other forms of dyspnea: Secondary | ICD-10-CM

## 2022-10-11 DIAGNOSIS — M79605 Pain in left leg: Secondary | ICD-10-CM | POA: Diagnosis not present

## 2022-10-11 DIAGNOSIS — I10 Essential (primary) hypertension: Secondary | ICD-10-CM

## 2022-10-11 DIAGNOSIS — M79604 Pain in right leg: Secondary | ICD-10-CM

## 2022-10-11 DIAGNOSIS — R0789 Other chest pain: Secondary | ICD-10-CM | POA: Diagnosis not present

## 2022-10-11 NOTE — Patient Instructions (Signed)
Medication Instructions:  Your Physician recommend you continue on your current medication as directed.    *If you need a refill on your cardiac medications before your next appointment, please call your pharmacy*   Lab Work: None ordered today   Testing/Procedures: Your provider has ordered a Lexiscan/ Exercise Myoview Stress test. This will take place at [facility]. Please report to the [facility] medical mall entrance. The volunteers at the first desk will direct you where to go.  [place]  Your provider has ordered a Stress Test with nuclear imaging. The purpose of this test is to evaluate the blood supply to your heart muscle. This procedure is referred to as a "Non-Invasive Stress Test." This is because other than having an IV started in your vein, nothing is inserted or "invades" your body. Cardiac stress tests are done to find areas of poor blood flow to the heart by determining the extent of coronary artery disease (CAD). Some patients exercise on a treadmill, which naturally increases the blood flow to your heart, while others who are unable to walk on a treadmill due to physical limitations will have a pharmacologic/chemical stress agent called Lexiscan . This medicine will mimic walking on a treadmill by temporarily increasing your coronary blood flow.   Please note: these test may take anywhere between 2-4 hours to complete  How to prepare for your Myoview test:  Nothing to eat for 6 hours prior to the test No caffeine for 24 hours prior to test No smoking 24 hours prior to test. Your medication may be taken with water.  If your doctor stopped a medication because of this test, do not take that medication. Ladies, please do not wear dresses.  Skirts or pants are appropriate. Please wear a short sleeve shirt. No perfume, cologne or lotion. Wear comfortable walking shoes. No heels!   PLEASE NOTIFY THE OFFICE AT LEAST 62 HOURS IN ADVANCE IF YOU ARE UNABLE TO KEEP YOUR APPOINTMENT.   [phone] AND  PLEASE NOTIFY NUCLEAR MEDICINE AT [facility] AT LEAST 24 HOURS IN ADVANCE IF YOU ARE UNABLE TO KEEP YOUR APPOINTMENT. [phone]    Follow-Up: At [facility], you and your health needs are our priority.  As part of our continuing mission to provide you with exceptional heart care, we have created designated Provider Care Teams.  These Care Teams include your primary Cardiologist (physician) and Advanced Practice Providers (APPs -  Physician Assistants and Nurse Practitioners) who all work together to provide you with the care you need, when you need it.  We recommend signing up for the patient portal called "MyChart".  Sign up information is provided on this After Visit Summary.  MyChart is used to connect with patients for Virtual Visits (Telemedicine).  Patients are able to view lab/test results, encounter notes, upcoming appointments, etc.  Non-urgent messages can be sent to your provider as well.   To learn more about what you can do with MyChart, go to [email or web address].    Your next appointment:   3 month(s)  Provider:   You may see Nelva Bush, MD or one of the following Advanced Practice Providers on your designated Care Team:   Murray Hodgkins, NP Christell Faith, PA-C Cadence Kathlen Mody, PA-C Gerrie Nordmann, NP

## 2022-10-11 NOTE — Addendum Note (Signed)
Addended by: James Ivanoff D on: 10/11/2022 02:55 PM   Modules accepted: Orders

## 2022-10-17 ENCOUNTER — Encounter
Admission: RE | Admit: 2022-10-17 | Discharge: 2022-10-17 | Disposition: A | Discharge status: Home / Self Care | Payer: Medicare PPO | Source: Ambulatory Visit | Attending: Internal Medicine | Admitting: Internal Medicine

## 2022-10-17 DIAGNOSIS — R0789 Other chest pain: Secondary | ICD-10-CM

## 2022-10-17 DIAGNOSIS — R0609 Other forms of dyspnea: Secondary | ICD-10-CM | POA: Insufficient documentation

## 2022-10-17 LAB — NM MYOCAR MULTI W/SPECT W/WALL MOTION / EF
Angina Index: 0
Estimated workload: 4.6
Exercise duration (min): 2 min
Exercise duration (sec): 11 s
LV dias vol: 45 mL (ref 46–106)
LV sys vol: 13 mL
MPHR: 147 {beats}/min
Nuc Stress EF: 71 %
Peak HR: 122 {beats}/min
Percent HR: 82 %
Rest HR: 87 {beats}/min
Rest Nuclear Isotope Dose: 10.2 mCi
SDS: 2
SRS: 6
SSS: 5
Stress Nuclear Isotope Dose: 30.4 mCi
TID: 0.84

## 2022-10-17 MED ORDER — REGADENOSON 0.4 MG/5ML IV SOLN
0.4000 mg | Freq: Once | INTRAVENOUS | Status: AC
  Administered 2022-10-17: 0.4 mg via INTRAVENOUS
  Filled 2022-10-17: qty 5

## 2022-10-17 MED ORDER — TECHNETIUM TC 99M TETROFOSMIN IV KIT
30.0000 | PACK | Freq: Once | INTRAVENOUS | Status: AC | PRN
  Administered 2022-10-17: 30.37 via INTRAVENOUS

## 2022-10-17 MED ORDER — TECHNETIUM TC 99M TETROFOSMIN IV KIT
10.0000 | PACK | Freq: Once | INTRAVENOUS | Status: AC | PRN
  Administered 2022-10-17: 10.24 via INTRAVENOUS

## 2022-10-24 ENCOUNTER — Telehealth: Payer: Self-pay | Admitting: Internal Medicine

## 2022-10-24 DIAGNOSIS — R0602 Shortness of breath: Secondary | ICD-10-CM

## 2022-10-24 NOTE — Telephone Encounter (Signed)
Let's refer her to pulmonary and also get an echo cardiogram for shortness of breath.  Ok to keep currently scheduled f/u visit for April.  We can always move visit up sooner if echo shows significant abnormalities.  Nelva Bush, MD [facility]

## 2022-10-24 NOTE — Telephone Encounter (Signed)
Pt called stating due to normal stress test and continued SOB, she is questioning if MD feels she needs a pulmonology referral.   Will forward for recommendations

## 2022-10-24 NOTE — Telephone Encounter (Signed)
Pt made aware of MD's recommendations and verbalized understanding ECHO order and pulm referral placed  Message sent to scheduling.

## 2022-10-24 NOTE — Telephone Encounter (Signed)
Patient is requesting if a Pulmonary referral is needed still having SOB after stress was done also states April fu appt with Dr. Saunders Revel is too far if still having issues. Please advise

## 2022-11-14 ENCOUNTER — Ambulatory Visit: Payer: Medicare PPO | Admitting: Internal Medicine

## 2022-11-14 ENCOUNTER — Encounter: Payer: Self-pay | Admitting: Internal Medicine

## 2022-11-14 VITALS — BP 140/80 | HR 86 | Temp 98.0°F | Ht 66.0 in | Wt 246.2 lb

## 2022-11-14 DIAGNOSIS — G4733 Obstructive sleep apnea (adult) (pediatric): Secondary | ICD-10-CM

## 2022-11-14 DIAGNOSIS — J449 Chronic obstructive pulmonary disease, unspecified: Secondary | ICD-10-CM

## 2022-11-14 DIAGNOSIS — R0689 Other abnormalities of breathing: Secondary | ICD-10-CM | POA: Diagnosis not present

## 2022-11-14 MED ORDER — BREZTRI AEROSPHERE 160-9-4.8 MCG/ACT IN AERO: 2.0000 | INHALATION_SPRAY | Freq: Two times a day (BID) | RESPIRATORY_TRACT | 10 refills | Status: DC

## 2022-11-14 MED ORDER — BREZTRI AEROSPHERE 160-9-4.8 MCG/ACT IN AERO: 2.0000 | INHALATION_SPRAY | Freq: Two times a day (BID) | RESPIRATORY_TRACT | 0 refills | Status: DC

## 2022-11-14 NOTE — Progress Notes (Signed)
[facility] [facility] Consultation      Date: 11/14/2022,   MRN# [id number] Tiffany Delacruz 1951-08-20     CHIEF COMPLAINT:   SOB   HISTORY OF PRESENT ILLNESS  72 y.o. female with history of hypertension, hyperlipidemia, Graves' disease with subsequent hypothyroidism, obstructive sleep apnea (not compliant with CPAP), GERD, and depression/anxiety  Patient with a previous diagnosis of obstructive sleep apnea however is noncompliant due to the fact that she hates the mask   Recommend Sleep Study Recommend weight loss  Encouraged proper weight management.  Important to get eight or more hours of sleep  Limiting the use of the computer and television before bedtime.  Decrease naps during the day, so night time sleep will become enhanced.  Limit caffeine, and sleep deprivation.  HTN, stroke, uncontrolled diabetes and heart failure are potential risk factors.  Risk of untreated sleep apnea including cardiac arrhthymias, stroke, DM, pulm HTN.   Patient has a longstanding history of secondhand smoke exposure I explained to patient that she has likely diagnosis of COPD  Patient has shortness of breath and exertional dyspnea Has been having these problems for several years   CARDIAC HISTORY  --------------------------------------------------------------------------------------------------   Cardiovascular History  Non-Invasive Evaluation(s): TTE (11/12/2019): Normal LV size and wall thickness.  LVEF 55-60% with normal wall motion and diastolic function.  Normal RV size and function with mild pulmonary hypertension (per report; RVSP 31 mmHg -> ULN).  No significant valvular abnormalities. Cardiac CTA (10/09/2019): Normal right dominant coronary arteries without CAD.  Coronary calcium score = 0.  Aortic atherosclerosis was noted.      PAST MEDICAL HISTORY   Past Medical History:  Diagnosis Date   Anginal pain ([place])    Anxiety    Depression    GERD (gastroesophageal  reflux disease)    Graves disease    Hyperlipidemia    Hypertension    Hypothyroidism    Obesity    Osteoarthritis    Sleep apnea    NO CPAP   Wheezing 07/14/2018     SURGICAL HISTORY   Past Surgical History:  Procedure Laterality Date   BACK SURGERY  1988   BREAST BIOPSY Right 2017   benign, with marker   CARPAL TUNNEL RELEASE Left 07/20/2017   Procedure: CARPAL TUNNEL RELEASE;  Surgeon: Dereck Leep, MD;  Location: [facility] ORS;  Service: Orthopedics;  Laterality: Left;   CARPAL TUNNEL RELEASE Right 06/05/2022   Procedure: CARPAL TUNNEL RELEASE;  Surgeon: Dereck Leep, MD;  Location: [facility] ORS;  Service: Orthopedics;  Laterality: Right;   cataracts surgery     COLONOSCOPY     disk repair     EYE SURGERY Bilateral 10/10/2018   JOINT REPLACEMENT Right 05/2013   knee replacement   TRIGGER FINGER RELEASE Left 2023     FAMILY HISTORY   Family History  Problem Relation Age of Onset   Uterine cancer Mother    Colon cancer Mother    Heart disease Father    Alcohol abuse Father    Heart attack Father 51   Rheum arthritis Sister    Diabetes Maternal Grandmother    Rheum arthritis Brother      SOCIAL HISTORY   Social History   Tobacco Use   Smoking status: Never   Smokeless tobacco: Never  Vaping Use   Vaping Use: Never used  Substance Use Topics   Alcohol use: No    Alcohol/week: 0.0 standard drinks of alcohol   Drug use: No  MEDICATIONS    Home Medication:  Current Outpatient Rx   Order #: [id number] Class: Historical Med   Order #: [id number] Class: Normal   Order #: [id number] Class: Historical Med   Order #: [id number] Class: Normal   Order #: [id number] Class: Historical Med   Order #: [id number] Class: Normal   Order #: [id number] Class: Normal   Order #: [id number] Class: Normal   Order #: [id number] Class: Normal   Order #: [id number] Class: Normal   Order #: [id number] Class: Historical Med   Order #: [id number] Class: Normal    Current  Medication:  Current Outpatient Medications:    acetaminophen (TYLENOL) 650 MG CR tablet, Take 1,300 mg by mouth every 8 (eight) hours as needed for pain., Disp: , Rfl:    albuterol (VENTOLIN HFA) 108 (90 Base) MCG/ACT inhaler, TAKE 2 PUFFS BY MOUTH EVERY 6 HOURS AS NEEDED FOR WHEEZE OR SHORTNESS OF BREATH, Disp: 17 g, Rfl: 3   b complex vitamins tablet, Take 1 tablet by mouth daily., Disp: , Rfl:    chlorthalidone (HYGROTON) 25 MG tablet, Take 0.5 tablets (12.5 mg total) by mouth daily., Disp: 45 tablet, Rfl: 2   famotidine (PEPCID) 20 MG tablet, Take 20 mg by mouth as needed., Disp: , Rfl:    fluticasone (FLONASE) 50 MCG/ACT nasal spray, SPRAY 2 SPRAYS INTO EACH NOSTRIL EVERY DAY (Patient taking differently: as needed. SPRAY 2 SPRAYS INTO EACH NOSTRIL EVERY DAY), Disp: 48 mL, Rfl: 3   levothyroxine (SYNTHROID) 150 MCG tablet, Take 1 tablet (150 mcg total) by mouth daily., Disp: 90 tablet, Rfl: 3   losartan (COZAAR) 100 MG tablet, Take 1 tablet (100 mg total) by mouth daily., Disp: 90 tablet, Rfl: 4   mirabegron ER (MYRBETRIQ) 50 MG TB24 tablet, Take 1 tablet (50 mg total) by mouth daily., Disp: 90 tablet, Rfl: 4   nitroGLYCERIN (NITROSTAT) 0.4 MG SL tablet, Place 1 tablet (0.4 mg total) under the tongue every 5 (five) minutes as needed for chest pain (maximum of 3 doses.)., Disp: 25 tablet, Rfl: 2   OVER THE COUNTER MEDICATION, as needed. Allergy eye drops, Disp: , Rfl:    rosuvastatin (CRESTOR) 10 MG tablet, Take 1 tablet (10 mg total) by mouth daily., Disp: 90 tablet, Rfl: 3    ALLERGIES   Patient has no known allergies.     REVIEW OF SYSTEMS    Review of Systems:  Gen:  Denies  fever, sweats, chills weigh loss  HEENT: Denies blurred vision, double vision, ear pain, eye pain, hearing loss, nose bleeds, sore throat Cardiac:  No dizziness, chest pain or heaviness, chest tightness,edema Resp:   Denies cough or sputum porduction, +shortness of breath,+wheezing, -hemoptysis,  Gi:  Denies swallowing difficulty, stomach pain, nausea or vomiting, diarrhea, constipation, bowel incontinence Gu:  Denies bladder incontinence, burning urine Ext:   Denies Joint pain, stiffness or swelling Skin: Denies  skin rash, easy bruising or bleeding or hives Endoc:  Denies polyuria, polydipsia , polyphagia or weight change Psych:   Denies depression, insomnia or hallucinations   Other:  All other systems negative  BP (!) 140/80 (BP Location: Left Arm, Cuff Size: Large)   Pulse 86   Temp 98 F (36.7 C) (Temporal)   Ht 5' 6"$  (1.676 m)   Wt 246 lb 3.2 oz (111.7 kg)   LMP 12/15/1991 (Approximate)   SpO2 96%   BMI 39.74 kg/m     PHYSICAL EXAM  General Appearance: No distress  EYES PERRLA, EOM intact.   NECK Supple, No JVD Pulmonary: normal breath sounds, No wheezing.  CardiovascularNormal S1,S2.  No m/r/g.   Abdomen: Benign, Soft, non-tender. Skin:   warm, no rashes, no ecchymosis  Extremities: normal, no cyanosis, clubbing. Neuro:without focal findings,  speech normal  PSYCHIATRIC: Mood, affect within normal limits.   ALL OTHER ROS ARE NEGATIVE      IMAGING    NM Myocar Multi W/Spect W/Wall Motion / EF  Result Date: 10/17/2022   The study is normal. The study is low risk.  Poor exercise capacity.  The test was changed to [facility] as a result.   LV perfusion is normal. There is no evidence of ischemia. There is no evidence of infarction.   Left ventricular function is normal. Nuclear stress EF: 71 %. The left ventricular ejection fraction is hyperdynamic (>65%). End diastolic cavity size is normal. End systolic cavity size is normal.   CT attenuation images showed only minimal coronary calcifications.      ASSESSMENT/PLAN   72 year old pleasant white female seen today for longstanding secondhand smoke exposure with probable underlying COPD in the setting of deconditioned state respiratory insufficiency obesity with a diagnosis of OSA who is noncompliant   Presumed  COPD Recommend obtaining pulmonary function test to assess lung function  Shortness of breath and dyspnea exertion with wheezing Recommend assessing for exertional hypoxia with 6-minute walk test recommend assessing for nocturnal hypoxia with overnight pulse oximetry  Will start Breztri inhaler 2 puffs twice daily rinse her mouth after every use Albuterol as needed   Recommend weight loss Patient current weight is 246 pounds seized up 50 pounds over the last 2 years  Obesity -recommend significant weight loss -recommend changing diet  Deconditioned state -Recommend increased daily activity and exercise    MEDICATION ADJUSTMENTS/LABS AND TESTS ORDERED: Recommend starting Breztri 2 puffs twice daily please rinse mouth after every use  Recommend using albuterol inhaler as needed 2 to 4 puffs every 4 hours   Recommend weight loss  Obtain pulmonary function test to assess lung function  Recommend 6-minute walk test to assess for oxygen levels Recommend overnight pulse oximetry to assess for oxygen levels at night  Avoid secondhand smoke Avoid SICK contacts Recommend  Masking  when appropriate Recommend Keep up-to-date with vaccinations   CURRENT MEDICATIONS REVIEWED AT [place]   Patient  satisfied with Plan of action and management. All questions answered  Follow up  6 months  Total Time Spent  51 mins   Corrin Parker, M.D.  [facility]  Medical Director [place] Director [facility] Cardio-Pulmonary Department

## 2022-11-14 NOTE — Addendum Note (Signed)
Addended by: Claudette Head A on: 11/14/2022 11:49 AM   Modules accepted: Orders

## 2022-11-14 NOTE — Patient Instructions (Addendum)
Recommend starting Breztri 2 puffs twice daily please rinse mouth after every use  Recommend using albuterol inhaler as needed 2 to 4 puffs every 4 hours   Recommend weight loss  Obtain pulmonary function test to assess lung function  Recommend 6-minute walk test to assess for oxygen levels Recommend overnight pulse oximetry to assess for oxygen levels at night  Avoid secondhand smoke Avoid SICK contacts Recommend  Masking  when appropriate Recommend Keep up-to-date with vaccinations

## 2022-11-29 ENCOUNTER — Other Ambulatory Visit: Payer: Self-pay | Admitting: Nurse Practitioner

## 2022-11-29 NOTE — Telephone Encounter (Signed)
Requested Prescriptions  Pending Prescriptions Disp Refills   albuterol (VENTOLIN HFA) 108 (90 Base) MCG/ACT inhaler [Pharmacy Med Name: ALBUTEROL HFA (PROAIR) INHALER] 17 each 0    Sig: TAKE 2 PUFFS BY MOUTH EVERY 6 HOURS AS NEEDED FOR WHEEZE OR SHORTNESS OF BREATH     Pulmonology:  Beta Agonists 2 Failed - 11/29/2022  1:32 AM      Failed - Last BP in normal range    BP Readings from Last 1 Encounters:  11/14/22 (!) 140/80         Passed - Last Heart Rate in normal range    Pulse Readings from Last 1 Encounters:  11/14/22 86         Passed - Valid encounter within last 12 months    Recent Outpatient Visits           1 month ago Medicare annual wellness visit, subsequent   [place], [place] T, NP   8 months ago Recurrent major depressive disorder, in full remission ([place])   [place], Henrine Screws T, NP   1 year ago Recurrent major depressive disorder, in full remission ([place])   [place], Henrine Screws T, NP   1 year ago Acute cough   [place], Lauren A, NP   1 year ago Overactive bladder   [place], Barbaraann Faster, NP       Future Appointments             In 1 month [place], Harrell Gave, MD [place] at [place]   In 4 months [place], Barbaraann Faster, NP [place], [place]

## 2022-12-13 ENCOUNTER — Telehealth: Payer: Self-pay

## 2022-12-13 DIAGNOSIS — J449 Chronic obstructive pulmonary disease, unspecified: Secondary | ICD-10-CM

## 2022-12-13 NOTE — Telephone Encounter (Signed)
ONO reviewed by Dr. Jackie Plum 1L QHS.   Patient is aware of results and voiced her understanding.  She agrees with plan.  Order placed to adapt for 1L QHS. Nothing further needed.

## 2022-12-14 DIAGNOSIS — J449 Chronic obstructive pulmonary disease, unspecified: Secondary | ICD-10-CM | POA: Diagnosis not present

## 2022-12-15 ENCOUNTER — Ambulatory Visit: Payer: Medicare PPO | Attending: Internal Medicine

## 2022-12-15 DIAGNOSIS — R0602 Shortness of breath: Secondary | ICD-10-CM

## 2022-12-15 LAB — ECHOCARDIOGRAM COMPLETE
AR max vel: 3.17 cm2
AV Area VTI: 3.22 cm2
AV Area mean vel: 3.09 cm2
AV Mean grad: 3 mmHg
AV Peak grad: 5.2 mmHg
Ao pk vel: 1.14 m/s
Area-P 1/2: 3.48 cm2
S' Lateral: 3.1 cm
Single Plane A4C EF: 53.9 %

## 2022-12-30 ENCOUNTER — Other Ambulatory Visit: Payer: Self-pay | Admitting: Nurse Practitioner

## 2023-01-01 NOTE — Telephone Encounter (Signed)
Requested Prescriptions  Pending Prescriptions Disp Refills   albuterol (VENTOLIN HFA) 108 (90 Base) MCG/ACT inhaler [Pharmacy Med Name: ALBUTEROL HFA (PROAIR) INHALER] 17 each 0    Sig: TAKE 2 PUFFS BY MOUTH EVERY 6 HOURS AS NEEDED FOR WHEEZE OR SHORTNESS OF BREATH     Pulmonology:  Beta Agonists 2 Failed - 12/30/2022  8:43 AM      Failed - Last BP in normal range    BP Readings from Last 1 Encounters:  11/14/22 (!) 140/80         Passed - Last Heart Rate in normal range    Pulse Readings from Last 1 Encounters:  11/14/22 86         Passed - Valid encounter within last 12 months    Recent Outpatient Visits           2 months ago Medicare annual wellness visit, subsequent   [place] [facility] [place], Vayas T, NP   9 months ago Recurrent major depressive disorder, in full remission (HCC)   [place] [facility] [place], Corrie Dandy T, NP   1 year ago Recurrent major depressive disorder, in full remission (HCC)   [place] [facility] [place], Corrie Dandy T, NP   1 year ago Acute cough   [place] [facility] [place], Lauren A, NP   1 year ago Overactive bladder   [place] [facility] [place], Dorie Rank, NP       Future Appointments             In 2 days End, Cristal Deer, MD [facility] [place]   In 3 months [place], Dorie Rank, NP [place] [facility]

## 2023-01-03 ENCOUNTER — Ambulatory Visit: Payer: Medicare PPO | Attending: Internal Medicine | Admitting: Internal Medicine

## 2023-01-03 ENCOUNTER — Encounter: Payer: Self-pay | Admitting: Internal Medicine

## 2023-01-03 VITALS — BP 160/90 | HR 83 | Ht 65.0 in | Wt 251.2 lb

## 2023-01-03 DIAGNOSIS — R0609 Other forms of dyspnea: Secondary | ICD-10-CM | POA: Diagnosis not present

## 2023-01-03 DIAGNOSIS — I10 Essential (primary) hypertension: Secondary | ICD-10-CM | POA: Diagnosis not present

## 2023-01-03 NOTE — Patient Instructions (Signed)
Medication Instructions:  Your Physician recommend you continue on your current medication as directed.    *If you need a refill on your cardiac medications before your next appointment, please call your pharmacy*   Lab Work: None ordered today   Testing/Procedures: None ordered today   Follow-Up: At [facility], you and your health needs are our priority.  As part of our continuing mission to provide you with exceptional heart care, we have created designated Provider Care Teams.  These Care Teams include your primary Cardiologist (physician) and Advanced Practice Providers (APPs -  Physician Assistants and Nurse Practitioners) who all work together to provide you with the care you need, when you need it.  We recommend signing up for the patient portal called "MyChart".  Sign up information is provided on this After Visit Summary.  MyChart is used to connect with patients for Virtual Visits (Telemedicine).  Patients are able to view lab/test results, encounter notes, upcoming appointments, etc.  Non-urgent messages can be sent to your provider as well.   To learn more about what you can do with MyChart, go to [email or web address].    Your next appointment:   3 month(s)  Provider:   You may see Yvonne Kendall, MD or one of the following Advanced Practice Providers on your designated Care Team:   Nicolasa Ducking, NP Eula Listen, PA-C Cadence Fransico Michael, PA-C Charlsie Quest, NP    Dr. Okey Dupre recommends checking and keeping a log of your blood pressure daily. Please call the office if you notice your blood pressure is consistently greater than 140/90.  It is best to check your blood pressure 1-2 hours after taking your medications.  Below are some tips that our clinical pharmacists share for home BP monitoring:          Rest 10 minutes before taking your blood pressure.          Don't smoke or drink caffeinated beverages for at least 30 minutes before.          Take your  blood pressure before (not after) you eat.          Sit comfortably with your back supported and both feet on the floor (don't cross your legs).          Elevate your arm to heart level on a table or a desk.          Use the proper sized cuff. It should fit smoothly and snugly around your bare upper arm. There should be enough room to slip a fingertip under the cuff. The bottom edge of the cuff should be 1 inch above the crease of the elbow.

## 2023-01-03 NOTE — Progress Notes (Signed)
Follow-up Outpatient Visit Date: 01/03/2023  Primary Care Provider: Marjie Skiff, NP [address] [place] [address]  Chief Complaint: Shortness of breath  HPI:  Tiffany Delacruz is a 72 y.o. female with history of hypertension, hyperlipidemia, Graves' disease with subsequent hypothyroidism, obstructive sleep apnea (not compliant with CPAP), GERD, and depression/anxiety, who presents for follow-up of chest pain and fatigue.  I last saw her in January, at which time she was concerned about exertional dyspnea and fatigue, which was slightly worse than our last visit 3 years ago.  She was referred for pharmacologic MPI and echocardiogram.  Echo showed normal LVEF with grade 1 diastolic dysfunction and mild MR.  MPI was low risk without evidence of ischemia or scar.  Poor exercise tolerance was noted during attempted exercise.  Prior coronary CTA in 2021 had also shown normal coronary arteries.  She was referred to pulmonology for further evaluation; it was felt that her dyspnea may be related to COPD from longstanding secondhand smoke exposure.  PFTs are pending; Tiffany Delacruz reports that they are scheduled for July.  Today, Tiffany Delacruz that she continues to have shortness of breath with minimal activity.  She may be feeling slightly better since Breztri was added by Dr. Jeralene Huff, as she is not having to use her rescue inhaler as often.  She is using supplemental oxygen at night but no CPAP.  She denies chest pain, palpitations, lightheadedness, and edema.  Home blood pressure is typically better than today's readings in the office, usually around 125/75.  --------------------------------------------------------------------------------------------------  Cardiovascular History & Procedures: Cardiovascular Problems: Dyspnea on exertion   Risk Factors: Hypertension, hyperlipidemia, obesity, and age greater than 75   Cath/PCI: None   CV Surgery: None   EP Procedures and Devices: None    Non-Invasive Evaluation(s): TTE (12/15/2022): Normal LV size and wall thickness.  LVEF 60-65% grade 1 diastolic dysfunction.  Normal RV size and function.  Mild left atrial enlargement.  Mild mitral regurgitation.  Borderline enlargement of the ascending aorta, measuring 3.7 cm. Pharmacologic MPI (10/17/2022): Low risk study without ischemia scar.  Poor exercise capacity noted requiring change to regadenoson.  LVEF greater than 65%. TTE (11/12/2019): Normal LV size and wall thickness.  LVEF 55-60% with normal wall motion and diastolic function.  Normal RV size and function with mild pulmonary hypertension (per report; RVSP 31 mmHg -> ULN).  No significant valvular abnormalities. Cardiac CTA (10/09/2019): Normal right dominant coronary arteries without CAD.  Coronary calcium score = 0.  Aortic atherosclerosis was noted.  Recent CV Pertinent Labs: Lab Results  Component Value Date   CHOL 141 10/04/2022   CHOL 187 04/16/2015   HDL 54 10/04/2022   LDLCALC 68 10/04/2022   TRIG 101 10/04/2022   TRIG 95 04/16/2015   CHOLHDL 3.6 05/13/2018   INR 1.0 06/02/2013   BNP 29.4 06/27/2019   K 4.0 10/04/2022   K 3.6 06/18/2013   MG 1.7 04/03/2022   BUN 28 (H) 10/04/2022   BUN 14 06/18/2013   CREATININE 0.84 10/04/2022   CREATININE 0.96 06/18/2013    Past medical and surgical history were reviewed and updated in EPIC.  Current Meds  Medication Sig   acetaminophen (TYLENOL) 650 MG CR tablet Take 1,300 mg by mouth every 8 (eight) hours as needed for pain.   albuterol (VENTOLIN HFA) 108 (90 Base) MCG/ACT inhaler TAKE 2 PUFFS BY MOUTH EVERY 6 HOURS AS NEEDED FOR WHEEZE OR SHORTNESS OF BREATH   b complex vitamins tablet Take 1  tablet by mouth daily.   Budeson-Glycopyrrol-Formoterol (BREZTRI AEROSPHERE) 160-9-4.8 MCG/ACT AERO Inhale 2 puffs into the lungs in the morning and at bedtime.   chlorthalidone (HYGROTON) 25 MG tablet Take 0.5 tablets (12.5 mg total) by mouth daily.   famotidine (PEPCID) 20 MG  tablet Take 20 mg by mouth as needed.   fluticasone (FLONASE) 50 MCG/ACT nasal spray SPRAY 2 SPRAYS INTO EACH NOSTRIL EVERY DAY (Patient taking differently: as needed. SPRAY 2 SPRAYS INTO EACH NOSTRIL EVERY DAY)   levothyroxine (SYNTHROID) 150 MCG tablet Take 1 tablet (150 mcg total) by mouth daily.   losartan (COZAAR) 100 MG tablet Take 1 tablet (100 mg total) by mouth daily.   mirabegron ER (MYRBETRIQ) 50 MG TB24 tablet Take 1 tablet (50 mg total) by mouth daily.   nitroGLYCERIN (NITROSTAT) 0.4 MG SL tablet Place 1 tablet (0.4 mg total) under the tongue every 5 (five) minutes as needed for chest pain (maximum of 3 doses.).   OVER THE COUNTER MEDICATION as needed. Allergy eye drops   rosuvastatin (CRESTOR) 10 MG tablet Take 1 tablet (10 mg total) by mouth daily.    Allergies: Patient has no known allergies.  Social History   Tobacco Use   Smoking status: Never   Smokeless tobacco: Never  Vaping Use   Vaping Use: Never used  Substance Use Topics   Alcohol use: No    Alcohol/week: 0.0 standard drinks of alcohol   Drug use: No    Family History  Problem Relation Age of Onset   Uterine cancer Mother    Colon cancer Mother    Heart disease Father    Alcohol abuse Father    Heart attack Father 45   Rheum arthritis Sister    Diabetes Maternal Grandmother    Rheum arthritis Brother     Review of Systems: A 12-system review of systems was performed and was negative except as noted in the HPI.  --------------------------------------------------------------------------------------------------  Physical Exam: BP (!) 140/80 (BP Location: Left Arm, Patient Position: Sitting, Cuff Size: Normal)   Pulse 83   Ht 5\' 5"  (1.651 m)   Wt 251 lb 4 oz (114 kg)   LMP 12/15/1991 (Approximate)   SpO2 97%   BMI 41.81 kg/m  Repeat BP: 160/90  General:  NAD. Neck: No JVD or HJR. Lungs: Mildly diminished breath sounds throughout without wheezes or crackles. Heart: Regular rate and rhythm  without murmurs, rubs, or gallops. Abdomen: Soft, nontender, nondistended. Extremities: Trace nonpitting bilateral lower extremity edema.   Lab Results  Component Value Date   WBC 6.0 10/04/2022   HGB 13.2 10/04/2022   HCT 40.2 10/04/2022   MCV 86 10/04/2022   PLT 190 10/04/2022    Lab Results  Component Value Date   NA 140 10/04/2022   K 4.0 10/04/2022   CL 100 10/04/2022   CO2 25 10/04/2022   BUN 28 (H) 10/04/2022   CREATININE 0.84 10/04/2022   GLUCOSE 115 (H) 10/04/2022   ALT 14 10/04/2022    Lab Results  Component Value Date   CHOL 141 10/04/2022   HDL 54 10/04/2022   LDLCALC 68 10/04/2022   TRIG 101 10/04/2022   CHOLHDL 3.6 05/13/2018    --------------------------------------------------------------------------------------------------  ASSESSMENT AND PLAN: Dyspnea on exertion: This has been a chronic issue for Tiffany Delacruz but seems to have continued to worsen.  She is out of breath with minimal activity.  Cardiac workup thus far has been unrevealing.  She was recently seen by Dr. Belia Heman and is scheduled  for PFTs this summer.  There is concern that COPD may be playing a role in her dyspnea though she was never a smoker.  Recent echo was unable to assess PA pressure.  Prior echo in 2021 made note of mild pulmonary hypertension.  If her PFTs do not explain the severity of her dyspnea, I think it would be prudent to obtain a right heart catheterization to exclude pulmonary hypertension.  Deconditioning and morbid obesity are likely playing a role as well.  Pulmonary rehabilitation should be considered at the discretion of Dr. Belia HemanKasa.  Hypertension: Blood pressure mildly-moderately elevated today but typically better at home.  I have encouraged Tiffany Delacruz to limit her sodium intake.  Will defer medication changes today.  She will continue to monitor her blood pressure at home and alert Tiffany Delacruz if it is persistently above 140/90.  Morbid obesity: BMI > 40.  Weight loss  encouraged.  Follow-up: Return to clinic in late July/early August.  Tiffany Kendallhristopher Lavert Matousek, MD 01/03/2023 9:03 AM

## 2023-01-07 NOTE — Patient Instructions (Signed)
Fatigue If you have fatigue, you feel tired all the time and have a lack of energy or a lack of motivation. Fatigue may make it difficult to start or complete tasks because of exhaustion. Occasional or mild fatigue is often a normal response to activity or life. However, long-term (chronic) or extreme fatigue may be a symptom of a medical condition such as: Depression. Not having enough red blood cells or hemoglobin in the blood (anemia). A problem with a small gland located in the lower front part of the neck (thyroid disorder). Rheumatologic conditions. These are problems related to the body's defense system (immune system). Infections, especially certain viral infections. Fatigue can also lead to negative health outcomes over time. Follow these instructions at home: Medicines Take over-the-counter and prescription medicines only as told by your health care provider. Take a multivitamin if told by your health care provider. Do not use herbal or dietary supplements unless they are approved by your health care provider. Eating and drinking  Avoid heavy meals in the evening. Eat a well-balanced diet, which includes lean proteins, whole grains, plenty of fruits and vegetables, and low-fat dairy products. Avoid eating or drinking too many products with caffeine in them. Avoid alcohol. Drink enough fluid to keep your urine pale yellow. Activity  Exercise regularly, as told by your health care provider. Use or practice techniques to help you relax, such as yoga, tai chi, meditation, or massage therapy. Lifestyle Change situations that cause you stress. Try to keep your work and personal schedules in balance. Do not use recreational or illegal drugs. General instructions Monitor your fatigue for any changes. Go to bed and get up at the same time every day. Avoid fatigue by pacing yourself during the day and getting enough sleep at night. Maintain a healthy weight. Contact a health care  provider if: Your fatigue does not get better. You have a fever. You suddenly lose or gain weight. You have headaches. You have trouble falling asleep or sleeping through the night. You feel angry, guilty, anxious, or sad. You have swelling in your legs or another part of your body. Get help right away if: You feel confused, feel like you might faint, or faint. Your vision is blurry or you have a severe headache. You have severe pain in your abdomen, your back, or the area between your waist and hips (pelvis). You have chest pain, shortness of breath, or an irregular or fast heartbeat. You are unable to urinate, or you urinate less than normal. You have abnormal bleeding from the rectum, nose, lungs, nipples, or, if you are female, the vagina. You vomit blood. You have thoughts about hurting yourself or others. These symptoms may be an emergency. Get help right away. Call 911. Do not wait to see if the symptoms will go away. Do not drive yourself to the hospital. Get help right away if you feel like you may hurt yourself or others, or have thoughts about taking your own life. Go to your nearest emergency room or: Call 911. Call the National Suicide Prevention Lifeline at 1-800-273-8255 or 988. This is open 24 hours a day. Text the Crisis Text Line at 741741. Summary If you have fatigue, you feel tired all the time and have a lack of energy or a lack of motivation. Fatigue may make it difficult to start or complete tasks because of exhaustion. Long-term (chronic) or extreme fatigue may be a symptom of a medical condition. Exercise regularly, as told by your health care provider.   Change situations that cause you stress. Try to keep your work and personal schedules in balance. This information is not intended to replace advice given to you by your health care provider. Make sure you discuss any questions you have with your health care provider. Document Revised: 07/04/2021 Document  Reviewed: 07/04/2021 Elsevier Patient Education  2023 Elsevier Inc.  

## 2023-01-10 ENCOUNTER — Ambulatory Visit: Payer: Medicare PPO | Admitting: Internal Medicine

## 2023-01-12 ENCOUNTER — Ambulatory Visit: Payer: Medicare PPO | Admitting: Nurse Practitioner

## 2023-01-12 ENCOUNTER — Encounter: Payer: Self-pay | Admitting: Nurse Practitioner

## 2023-01-12 VITALS — BP 124/82 | HR 73 | Temp 97.9°F | Ht 65.0 in | Wt 247.8 lb

## 2023-01-12 DIAGNOSIS — I10 Essential (primary) hypertension: Secondary | ICD-10-CM

## 2023-01-12 DIAGNOSIS — R0609 Other forms of dyspnea: Secondary | ICD-10-CM

## 2023-01-12 NOTE — Assessment & Plan Note (Signed)
Ongoing, will continue collaboration with cardiology and pulmonary.  Suspect some deconditioning present, but will benefit further lung testing.  Is tolerating Breztri with some benefit, but still struggling even with 100 feet of walking and SOB.  Reached out to pulmonary to discuss patient concerns about lung testing being 3 months out, will see if can get this performed sooner.

## 2023-01-12 NOTE — Assessment & Plan Note (Signed)
Chronic, stable.  BP at goal today.  Recommend she monitor BP at least a few mornings a week at home and document.  DASH diet at home.  Continue current medication regimen and adjust as needed.  Labs today: up to date.

## 2023-01-12 NOTE — Progress Notes (Signed)
BP 124/82   Pulse 73   Temp 97.9 F (36.6 C) (Oral)   Ht  (1.651 m)   Wt 247 lb 12.8 oz (112.4 kg)   LMP 12/15/1991 (Approximate)   SpO2 95%   BMI 41.24 kg/m    Subjective:    Patient ID: Tiffany Delacruz, female    DOB: 11/04/1950, 72 y.o.   MRN: [id number]  HPI: Tiffany Delacruz is a 72 y.o. female  Chief Complaint  Patient presents with   Shortness of Breath    Upon exertion and feeling of exhausted. Has up coming appt with Pulmonologist    HYPERTENSION AND SBE Continues on Losartan 100 MG daily and Crestor 10 MG daily.  Follows with cardiology and last saw on 01/03/23.  No recent NTG use.  Dr. Okey Dupre at recent visit noted that if pulmonary testing is normal then they will pursue right heart cath.    Saw pulmonary 11/14/22 -- they started [facility] and she is using O2 at night while sleeping due to OSA.  Is to have a 6-minute walk test, but she reports this is not scheduled until July 2024.  Is concerned as her SOB is significant and can not walk 100 feet without SOB, sometimes almost passes out.  She feels that Markus Daft is helping some, does not need to use Albuterol as much.  Uses Albuterol on occasion with SOB, but not consistently.  Never a smoker, but did live with smoker (although they did not smoke in home or car).  Worked in school system.   Hypertension status: stable  Satisfied with current treatment? yes Duration of hypertension: chronic BP monitoring frequency:  daily BP range: <120/80 BP medication side effects:  no Medication compliance: good compliance Aspirin: no Recurrent headaches: no Visual changes: no Palpitations: no Dyspnea: yes, as above Chest pain: no Lower extremity edema: no Dizzy/lightheaded: no   Relevant past medical, surgical, family and social history reviewed and updated as indicated. Interim medical history since our last visit reviewed. Allergies and medications reviewed and updated.  Review of Systems  Constitutional:  Negative for  activity change, appetite change, diaphoresis, fatigue and fever.  Respiratory:  Positive for shortness of breath. Negative for cough, chest tightness and wheezing.   Cardiovascular:  Negative for chest pain, palpitations and leg swelling.  Gastrointestinal: Negative.   Endocrine: Negative for cold intolerance and heat intolerance.  Genitourinary: Negative.   Neurological: Negative.   Psychiatric/Behavioral: Negative.      Per HPI unless specifically indicated above     Objective:    BP 124/82   Pulse 73   Temp 97.9 F (36.6 C) (Oral)   Ht  (1.651 m)   Wt 247 lb 12.8 oz (112.4 kg)   LMP 12/15/1991 (Approximate)   SpO2 95%   BMI 41.24 kg/m   Wt Readings from Last 3 Encounters:  01/12/23 247 lb 12.8 oz (112.4 kg)  01/03/23 251 lb 4 oz (114 kg)  11/14/22 246 lb 3.2 oz (111.7 kg)    Physical Exam Vitals and nursing note reviewed.  Constitutional:      General: She is awake. She is not in acute distress.    Appearance: She is well-developed and well-groomed. She is obese. She is not ill-appearing.  HENT:     Head: Normocephalic.     Right Ear: Hearing normal. No drainage.     Left Ear: Hearing normal. No drainage.  Eyes:     General: Lids are normal.  Right eye: No discharge.        Left eye: No discharge.     Conjunctiva/sclera: Conjunctivae normal.     Pupils: Pupils are equal, round, and reactive to light.  Neck:     Thyroid: No thyromegaly.     Vascular: No carotid bruit.  Cardiovascular:     Rate and Rhythm: Normal rate and regular rhythm.     Heart sounds: Normal heart sounds. No murmur heard.    No gallop.  Pulmonary:     Effort: Pulmonary effort is normal. No accessory muscle usage or respiratory distress.     Breath sounds: Normal breath sounds.  Abdominal:     General: Bowel sounds are normal. There is no distension.     Palpations: Abdomen is soft.     Tenderness: There is no abdominal tenderness. There is no right CVA tenderness or left CVA  tenderness.  Musculoskeletal:     Cervical back: Normal range of motion and neck supple.     Right lower leg: No edema.     Left lower leg: No edema.  Lymphadenopathy:     Head:     Right side of head: No submental, submandibular, tonsillar, preauricular or posterior auricular adenopathy.     Left side of head: No submental, submandibular, tonsillar, preauricular or posterior auricular adenopathy.     Cervical: No cervical adenopathy.  Skin:    General: Skin is warm and dry.  Neurological:     Mental Status: She is alert and oriented to person, place, and time.     Gait: Gait is intact.     Deep Tendon Reflexes: Reflexes are normal and symmetric.     Reflex Scores:      Brachioradialis reflexes are 2+ on the right side and 2+ on the left side.      Patellar reflexes are 2+ on the right side and 2+ on the left side. Psychiatric:        Attention and Perception: Attention normal.        Mood and Affect: Mood normal.        Speech: Speech normal.        Behavior: Behavior normal. Behavior is cooperative.        Thought Content: Thought content normal.        Judgment: Judgment normal.     Results for orders placed or performed in visit on 12/15/22  ECHOCARDIOGRAM COMPLETE  Result Value Ref Range   AR max vel 3.17 cm2   AV Peak grad 5.2 mmHg   Ao pk vel 1.14 m/s   S' Lateral 3.10 cm   Area-P 1/2 3.48 cm2   AV Area VTI 3.22 cm2   AV Mean grad 3.0 mmHg   Single Plane A4C EF 53.9 %   AV Area mean vel 3.09 cm2   Est EF 60 - 65%       Assessment & Plan:   Problem List Items Addressed This Visit       Cardiovascular and Mediastinum   Essential hypertension - Primary    Chronic, stable.  BP at goal today.  Recommend she monitor BP at least a few mornings a week at home and document.  DASH diet at home.  Continue current medication regimen and adjust as needed.  Labs today: up to date.           Other   DOE (dyspnea on exertion)    Ongoing, will continue collaboration  with cardiology and pulmonary.  Suspect  some deconditioning present, but will benefit further lung testing.  Is tolerating Breztri with some benefit, but still struggling even with 100 feet of walking and SOB.  Reached out to pulmonary to discuss patient concerns about lung testing being 3 months out, will see if can get this performed sooner.          Follow up plan: Return for as scheduled July 10th.

## 2023-01-14 DIAGNOSIS — J449 Chronic obstructive pulmonary disease, unspecified: Secondary | ICD-10-CM | POA: Diagnosis not present

## 2023-01-15 ENCOUNTER — Telehealth: Payer: Self-pay

## 2023-01-15 NOTE — Telephone Encounter (Signed)
-----   Message from Erin Fulling, MD sent at 01/15/2023  8:50 AM EDT ----- Hello, if symptoms are worsening, then PFT's are not ideal to obtain, she needs to be at her best. Her overnight pulse oximetry shows she needs oxygen at night. We can see her back in clinic sooner than later.  Vaudine Dutan, can you call patient and maybe set up with any provider if needed     ----- Message ----- From: Marjie Skiff, NP Sent: 01/12/2023   4:23 PM EDT To: Erin Fulling, MD  Good afternoon Dr. Belia Heman, this patient is in office this afternoon and she is very concerned that her lung testing is not until July (3 months out) as she can not walk 100 feet without SOB.  She wanted to see if testing can be done sooner.  I know Dr. Okey Dupre had also mentioned heart cath if lung testing is normal.  I told her I would reach out to you on this as she is very concerned with her SOB.  Let me know if testing can be done sooner.

## 2023-01-15 NOTE — Telephone Encounter (Signed)
Spoke to patient and relayed below message/recommendations.  She will continue 1L QHS.  Appt scheduled 03/06/2023 at 9:15a. Nothing further needed.

## 2023-02-03 ENCOUNTER — Other Ambulatory Visit: Payer: Self-pay | Admitting: Nurse Practitioner

## 2023-02-05 NOTE — Telephone Encounter (Signed)
Requested Prescriptions  Pending Prescriptions Disp Refills   albuterol (VENTOLIN HFA) 108 (90 Base) MCG/ACT inhaler [Pharmacy Med Name: ALBUTEROL HFA (PROAIR) INHALER] 17 each 0    Sig: TAKE 2 PUFFS BY MOUTH EVERY 6 HOURS AS NEEDED FOR WHEEZE OR SHORTNESS OF BREATH     Pulmonology:  Beta Agonists 2 Passed - 02/03/2023  9:15 AM      Passed - Last BP in normal range    BP Readings from Last 1 Encounters:  01/12/23 124/82         Passed - Last Heart Rate in normal range    Pulse Readings from Last 1 Encounters:  01/12/23 73         Passed - Valid encounter within last 12 months    Recent Outpatient Visits           3 weeks ago Essential hypertension   [place] [facility], [place] T, NP   4 months ago Medicare annual wellness visit, subsequent   [facility] [place], [place] T, NP   10 months ago Recurrent major depressive disorder, in full remission (HCC)   [place] [facility] [place], Corrie Dandy T, NP   1 year ago Recurrent major depressive disorder, in full remission (HCC)   [place] [facility] [place], Corrie Dandy T, NP   1 year ago Acute cough   [place] [facility] Gerre Scull, NP       Future Appointments             In 4 weeks Erin Fulling, MD [facility] [place]   In 1 month [place], Dorie Rank, NP  [facility]   In 2 months [place], Cristal Deer, MD [facility]

## 2023-02-13 DIAGNOSIS — J449 Chronic obstructive pulmonary disease, unspecified: Secondary | ICD-10-CM | POA: Diagnosis not present

## 2023-03-06 ENCOUNTER — Ambulatory Visit: Payer: Medicare PPO | Admitting: Internal Medicine

## 2023-03-06 ENCOUNTER — Encounter: Payer: Self-pay | Admitting: Internal Medicine

## 2023-03-06 VITALS — BP 138/80 | HR 83 | Temp 97.5°F | Ht 65.0 in | Wt 249.6 lb

## 2023-03-06 DIAGNOSIS — J4530 Mild persistent asthma, uncomplicated: Secondary | ICD-10-CM

## 2023-03-06 DIAGNOSIS — G4733 Obstructive sleep apnea (adult) (pediatric): Secondary | ICD-10-CM | POA: Diagnosis not present

## 2023-03-06 MED ORDER — TRELEGY ELLIPTA 200-62.5-25 MCG/ACT IN AEPB
1.0000 | INHALATION_SPRAY | Freq: Every day | RESPIRATORY_TRACT | 5 refills | Status: DC
Start: 2023-03-06 — End: 2024-08-15

## 2023-03-06 MED ORDER — TRELEGY ELLIPTA 200-62.5-25 MCG/ACT IN AEPB: 1.0000 | INHALATION_SPRAY | Freq: Every day | RESPIRATORY_TRACT | 0 refills | Status: DC

## 2023-03-06 NOTE — Patient Instructions (Addendum)
Recommend cardiology evaluation soon as possible due to new symptoms over the last several months related to chest pressure with exertion, CARDIOLOGY ON CALL HAS BEEN NOTIFIED  Continue oxygen as prescribed  Continue albuterol as needed Lets start Trelegy 200 inhaler therapy  Avoid secondhand smoke Avoid SICK contacts Recommend  Masking  when appropriate Recommend Keep up-to-date with vaccinations  DO NOT PERFORM PFT's at this time

## 2023-03-06 NOTE — H&P (View-Only) (Signed)
Office Visit    Patient Name: Tiffany Delacruz Date of Encounter: 03/07/2023  Primary Care Provider:  Marjie Skiff, NP Primary Cardiologist:  Yvonne Kendall, MD  Chief Complaint    72 year old female with past medical history of tension, hyperlipidemia, Graves' disease with subsequent hypothyroidism, obstructive sleep apnea (unable to tolerate CPAP), GERD and anxiety/depression.  Presents today for chest pain and shortness of breath.  Past Medical History    Past Medical History:  Diagnosis Date   Anginal pain (HCC)    Anxiety    Depression    GERD (gastroesophageal reflux disease)    Graves disease    Hyperlipidemia    Hypertension    Hypothyroidism    Obesity    Osteoarthritis    Sleep apnea    NO CPAP   Wheezing 07/14/2018   Past Surgical History:  Procedure Laterality Date   BACK SURGERY  1988   BREAST BIOPSY Right 2017   benign, with marker   CARPAL TUNNEL RELEASE Left 07/20/2017   Procedure: CARPAL TUNNEL RELEASE;  Surgeon: Donato Heinz, MD;  Location: [facility] ORS;  Service: Orthopedics;  Laterality: Left;   CARPAL TUNNEL RELEASE Right 06/05/2022   Procedure: CARPAL TUNNEL RELEASE;  Surgeon: Donato Heinz, MD;  Location: [facility] ORS;  Service: Orthopedics;  Laterality: Right;   cataracts surgery     COLONOSCOPY     disk repair     EYE SURGERY Bilateral 10/10/2018   JOINT REPLACEMENT Right 05/2013   knee replacement   TRIGGER FINGER RELEASE Left 2023   Allergies  No Known Allergies  Labs/Other Studies Reviewed    The following studies were reviewed today:  Cardiac Studies & Procedures     STRESS TESTS  NM MYOCAR MULTI W/SPECT W 10/17/2022  Narrative   The study is normal. The study is low risk.  Poor exercise capacity.  The test was changed to [facility] as a result.   LV perfusion is normal. There is no evidence of ischemia. There is no evidence of infarction.   Left ventricular function is normal. Nuclear stress EF: 71 %. The left ventricular  ejection fraction is hyperdynamic (>65%). End diastolic cavity size is normal. End systolic cavity size is normal.   CT attenuation images showed only minimal coronary calcifications.   ECHOCARDIOGRAM  ECHOCARDIOGRAM COMPLETE 12/15/2022  Narrative ECHOCARDIOGRAM REPORT    Patient Name:   Tiffany Delacruz Date of Exam: 12/15/2022 Medical Rec #:  [id number]      Height:       66.0 in Accession #:    [id number]     Weight:       246.2 lb Date of Birth:  Feb 11, 1951      BSA:          2.184 m Patient Age:    71 years       BP:           138/82 mmHg Patient Gender: F              HR:           84 bpm. Exam Location:  [place]  Procedure: 2D Echo, 3D Echo, Cardiac Doppler, Color Doppler and Strain Analysis  Indications:    R06.02 SOB; R07.9* Chest pain, unspecified  History:        Patient has prior history of Echocardiogram examinations, most recent 11/12/2019. Signs/Symptoms:Shortness of Breath and Chest Pain; Risk Factors:Sleep Apnea, Hypertension, Dyslipidemia and Non-Smoker.  Sonographer:    Quentin Ore RDMS, RVT, RDCS  Referring Phys: [id number] CHRISTOPHER END  IMPRESSIONS   1. Left ventricular ejection fraction, by estimation, is 60 to 65%. The left ventricle has normal function. The left ventricle has no regional wall motion abnormalities. Left ventricular diastolic parameters are consistent with Grade I diastolic dysfunction (impaired relaxation). The average left ventricular global longitudinal strain is -16.6 %. 2. Right ventricular systolic function is normal. The right ventricular size is normal. 3. Left atrial size was mildly dilated. 4. The mitral valve is normal in structure. Mild mitral valve regurgitation. No evidence of mitral stenosis. 5. The aortic valve is tricuspid. Aortic valve regurgitation is not visualized. No aortic stenosis is present. 6. There is borderline dilatation of the ascending aorta, measuring 37 mm. 7. The inferior vena cava is normal in size with  greater than 50% respiratory variability, suggesting right atrial pressure of 3 mmHg.  FINDINGS Left Ventricle: Left ventricular ejection fraction, by estimation, is 60 to 65%. The left ventricle has normal function. The left ventricle has no regional wall motion abnormalities. The average left ventricular global longitudinal strain is -16.6 %. The left ventricular internal cavity size was normal in size. There is no left ventricular hypertrophy. Left ventricular diastolic parameters are consistent with Grade I diastolic dysfunction (impaired relaxation).  Right Ventricle: The right ventricular size is normal. No increase in right ventricular wall thickness. Right ventricular systolic function is normal.  Left Atrium: Left atrial size was mildly dilated.  Right Atrium: Right atrial size was normal in size.  Pericardium: There is no evidence of pericardial effusion.  Mitral Valve: The mitral valve is normal in structure. Mild mitral valve regurgitation. No evidence of mitral valve stenosis.  Tricuspid Valve: The tricuspid valve is normal in structure. Tricuspid valve regurgitation is mild . No evidence of tricuspid stenosis.  Aortic Valve: The aortic valve is tricuspid. Aortic valve regurgitation is not visualized. No aortic stenosis is present. Aortic valve mean gradient measures 3.0 mmHg. Aortic valve peak gradient measures 5.2 mmHg. Aortic valve area, by VTI measures 3.22 cm.  Pulmonic Valve: The pulmonic valve was normal in structure. Pulmonic valve regurgitation is not visualized. No evidence of pulmonic stenosis.  Aorta: The aortic root is normal in size and structure. There is borderline dilatation of the ascending aorta, measuring 37 mm.  Venous: The inferior vena cava is normal in size with greater than 50% respiratory variability, suggesting right atrial pressure of 3 mmHg.  IAS/Shunts: No atrial level shunt detected by color flow Doppler.   LEFT VENTRICLE PLAX 2D LVIDd:          4.40 cm      Diastology LVIDs:         3.10 cm      LV e' medial:    3.99 cm/s LV PW:         0.90 cm      LV E/e' medial:  14.3 LV IVS:        1.20 cm      LV e' lateral:   10.10 cm/s LVOT diam:     2.10 cm      LV E/e' lateral: 5.7 LV SV:         74 LV SV Index:   34           2D Longitudinal Strain LVOT Area:     3.46 cm     2D Strain GLS Avg:     -16.6 %  LV Volumes (MOD) LV vol d, MOD A4C: 101.0 ml 3D Volume EF:  LV vol s, MOD A4C: 46.6 ml  3D EF:        52 % LV SV MOD A4C:     101.0 ml LV EDV:       117 ml LV ESV:       56 ml LV SV:        61 ml  RIGHT VENTRICLE             IVC RV Basal diam:  3.90 cm     IVC diam: 1.60 cm RV S prime:     12.40 cm/s TAPSE (M-mode): 2.8 cm  LEFT ATRIUM             Index        RIGHT ATRIUM           Index LA diam:        4.50 cm 2.06 cm/m   RA Area:     24.80 cm LA Vol (A2C):   60.4 ml 27.65 ml/m  RA Volume:   88.50 ml  40.51 ml/m LA Vol (A4C):   63.3 ml 28.98 ml/m LA Biplane Vol: 64.7 ml 29.62 ml/m AORTIC VALVE                    PULMONIC VALVE AV Area (Vmax):    3.17 cm     PV Vmax:          1.14 m/s AV Area (Vmean):   3.09 cm     PV Peak grad:     5.2 mmHg AV Area (VTI):     3.22 cm     PR End Diast Vel: 9.12 msec AV Vmax:           114.00 cm/s AV Vmean:          76.550 cm/s AV VTI:            0.230 m AV Peak Grad:      5.2 mmHg AV Mean Grad:      3.0 mmHg LVOT Vmax:         104.33 cm/s LVOT Vmean:        68.400 cm/s LVOT VTI:          0.213 m LVOT/AV VTI ratio: 0.93  AORTA Ao Root diam: 3.50 cm Ao Asc diam:  3.70 cm Ao Arch diam: 2.8 cm  MITRAL VALVE                TRICUSPID VALVE MV Area (PHT): 3.48 cm     TR Peak grad:   32.9 mmHg MV Decel Time: 218 msec     TR Vmax:        287.00 cm/s MV E velocity: 57.25 cm/s MV A velocity: 103.50 cm/s  SHUNTS MV E/A ratio:  0.55         Systemic VTI:  0.21 m Systemic Diam: 2.10 cm  Julien Nordmann MD Electronically signed by Julien Nordmann MD Signature Date/Time:  12/15/2022/4:40:55 PM    Final     CT SCANS  CT CORONARY MORPH W/CTA COR W/SCORE 10/09/2019  Addendum 10/09/2019  2:30 PM ADDENDUM REPORT: 10/09/2019 14:28  ADDENDUM: OVER-READ INTERPRETATION  CT CHEST  The following report is an over-read performed by radiologist Dr. Arliss Journey [facility] on 10/09/2019 at 2:20 p.m. This over-read does not include interpretation of cardiac or coronary anatomy or pathology. The coronary CTA interpretation by the cardiologist is attached.  COMPARISON:  Chest radiograph 08/05/2018.  Chest CT 06/23/2012  FINDINGS:  Vascular: Aortic atherosclerosis. No aortic dissection or aneurysm. No central pulmonary embolism, on this non-dedicated study.  Mediastinum/Nodes: No imaged mediastinal adenopathy. Upper normal sized right infrahilar nodes are likely reactive.  Lungs/Pleura: No imaged pleural fluid.  Lingular scarring.  Clear imaged lungs.  Upper Abdomen: Well-circumscribed low-density liver lesions are likely cysts. Segment 2 low-density liver lesion is too small to characterize but vaguely present back in 2013. Normal imaged portions of the spleen, stomach.  Musculoskeletal: No acute osseous abnormality. Mild right hemidiaphragm elevation.  IMPRESSION:  1.  No acute findings in the imaged extracardiac chest. 2.  Aortic Atherosclerosis (ICD10-I70.0).   Electronically Signed By: Jeronimo Greaves M.D. On: 10/09/2019 14:28  Narrative CLINICAL DATA:  Hx of htn and chestpain  EXAM: Cardiac/Coronary  CTA  TECHNIQUE: The patient was scanned on a Siemens Somatoform go.Top scanner.  FINDINGS: A retrospective scan was triggered in the descending thoracic aorta. Axial non-contrast 3 mm slices were carried out through the heart. The data set was analyzed on a dedicated work station and scored using the Agatson method. Gantry rotation speed was 330 msecs and collimation was .6 mm. 12.5mg  of metoprolol and 0.8 mg of sl NTG  was given. The 3D data set was reconstructed in 5% intervals of the 60-95 % of the R-R cycle. Diastolic phases were analyzed on a dedicated work station using MPR, MIP and VRT modes. The patient received 125 cc of contrast.  Aorta:  Normal size.  No calcifications.  No dissection.  Aortic Valve:  Trileaflet.  No calcifications.  Coronary Arteries:  Normal coronary origin.  Right dominance.  RCA is a large dominant artery that gives rise to PDA and PLA. There is no plaque.  Left main is a large artery that gives rise to LAD and LCX arteries.  LAD is a large vessel that has no plaque.  LCX is a non-dominant artery that gives rise to one large OM1 branch. There is no plaque.  Other findings:  Normal pulmonary vein drainage into the left atrium.  Normal left atrial appendage without a thrombus.  Normal size of the pulmonary artery.  IMPRESSION: 1. Coronary calcium score of 0.  2. Normal coronary origin with right dominance.  3. No evidence of CAD.  4. CAD-RADS 0. No evidence of CAD (0%). Consider non-atherosclerotic causes of chest pain.  Electronically Signed: By: Debbe Odea M.D. On: 10/09/2019 13:23         Recent Labs: 04/03/2022: Magnesium 1.7 10/04/2022: ALT 14; BUN 28; Creatinine, Ser 0.84; Hemoglobin 13.2; Platelets 190; Potassium 4.0; Sodium 140; TSH 0.678  Recent Lipid Panel    Component Value Date/Time   CHOL 141 10/04/2022 0951   CHOL 187 04/16/2015 0847   TRIG 101 10/04/2022 0951   TRIG 95 04/16/2015 0847   HDL 54 10/04/2022 0951   CHOLHDL 3.6 05/13/2018 1541   VLDL 19 04/16/2015 0847   LDLCALC 68 10/04/2022 0951    History of Present Illness     72 year old female with past medical history of hypertension, hyperlipidemia, Graves' disease with subsequent hypothyroidism, obstructive sleep apnea (unable to tolerate CPAP), GERD and anxiety/depression.  Presents today for chest pain and shortness of breath.  Ms. Pfeiler was first seen by Dr.  Okey Dupre in December 2020, at that time she was noting dyspnea on exertion and chest discomfort.  In January 2021 she had cardiac CTA that was normal with no evidence of coronary artery disease.  She then had an echocardiogram that showed normal LVEF and diastolic parameters  with borderline elevated PA pressure.  In January 2024 she was referred back to Dr. Okey Dupre for similar symptoms of chest discomfort and dyspnea on exertion that were worse compared to symptoms in 2020/2021.  Her nuclear stress test in January showed no evidence of ischemia or scar, poor exercise tolerance was noted. Her repeat echo in March showed normal LVEF with grade 1 diastolic dysfunction and mild MR, PA pressures were unable to be assessed. She was referred to pulmonology for further evaluation of possible COPD.  On follow-up in April with Dr. Okey Dupre she felt like her dyspnea on exertion had slightly improved with the start of Breztri.     Today she reports two weeks of intermittent substernal chest pressure and worsening shortness of breath following exertion, relieved with rest over a few minutes. She also reports intermittent dizziness with her shortness of breath. She denies palpitations, orthopnea, n, v, syncope, edema. Reports her blood pressures at home average 120's/70's, with occasional lower blood pressures with which she is asymptomatic.   Home Medications   Current Outpatient Medications  Medication Sig Dispense Refill   acetaminophen (TYLENOL) 650 MG CR tablet Take 1,300 mg by mouth every 8 (eight) hours as needed for pain.     albuterol (VENTOLIN HFA) 108 (90 Base) MCG/ACT inhaler TAKE 2 PUFFS BY MOUTH EVERY 6 HOURS AS NEEDED FOR WHEEZE OR SHORTNESS OF BREATH 17 each 0   b complex vitamins tablet Take 1 tablet by mouth daily.     chlorthalidone (HYGROTON) 25 MG tablet Take 0.5 tablets (12.5 mg total) by mouth daily. 45 tablet 2   famotidine (PEPCID) 20 MG tablet Take 20 mg by mouth as needed.     FLUoxetine (PROZAC) 40 MG  capsule Take 40 mg by mouth daily.     fluticasone (FLONASE) 50 MCG/ACT nasal spray SPRAY 2 SPRAYS INTO EACH NOSTRIL EVERY DAY (Patient taking differently: as needed. SPRAY 2 SPRAYS INTO EACH NOSTRIL EVERY DAY) 48 mL 3   Fluticasone-Umeclidin-Vilant (TRELEGY ELLIPTA) 200-62.5-25 MCG/ACT AEPB Inhale 1 Act into the lungs daily. 1 each 5   levothyroxine (SYNTHROID) 150 MCG tablet Take 1 tablet (150 mcg total) by mouth daily. 90 tablet 3   losartan (COZAAR) 100 MG tablet Take 1 tablet (100 mg total) by mouth daily. 90 tablet 4   Melatonin 10 MG CHEW Chew by mouth at bedtime.     mirabegron ER (MYRBETRIQ) 50 MG TB24 tablet Take 1 tablet (50 mg total) by mouth daily. 90 tablet 4   nitroGLYCERIN (NITROSTAT) 0.4 MG SL tablet Place 1 tablet (0.4 mg total) under the tongue every 5 (five) minutes as needed for chest pain (maximum of 3 doses.). 25 tablet 2   OVER THE COUNTER MEDICATION as needed. Allergy eye drops     rosuvastatin (CRESTOR) 10 MG tablet Take 1 tablet (10 mg total) by mouth daily. 90 tablet 3   No current facility-administered medications for this visit.    Review of Systems  She denies palpitations, pnd, orthopnea, n, v, syncope, or edema. All other systems reviewed and are otherwise negative except as noted above.     Physical Exam    VS:  BP 104/66   Pulse 79   Ht 5\' 6"  (1.676 m)   Wt 250 lb 8 oz (113.6 kg)   LMP 12/15/1991 (Approximate)   SpO2 99%   BMI 40.43 kg/m  , BMI Body mass index is 40.43 kg/m.     GEN: Well nourished, well developed, in no acute distress. HEENT:  normal. Neck: Supple, no JVD, carotid bruits, or masses. Cardiac: RRR, no murmurs, rubs, or gallops. No clubbing, cyanosis, edema.  Radials/DP/PT 2+ and equal bilaterally.  Respiratory:  Respirations regular and unlabored, clear to auscultation bilaterally. GI: Soft, nontender, nondistended, BS + x 4. MS: no deformity or atrophy. Skin: warm and dry, no rash. Neuro:  Strength and sensation are  intact. Psych: Normal affect.  Accessory Clinical Findings    ECG personally reviewed by me today - Normal sinus rhythm at 79bpm, no ST/Twave changes noted.   Lab Results  Component Value Date   WBC 6.0 10/04/2022   HGB 13.2 10/04/2022   HCT 40.2 10/04/2022   MCV 86 10/04/2022   PLT 190 10/04/2022   Lab Results  Component Value Date   CREATININE 0.84 10/04/2022   BUN 28 (H) 10/04/2022   NA 140 10/04/2022   K 4.0 10/04/2022   CL 100 10/04/2022   CO2 25 10/04/2022   Lab Results  Component Value Date   ALT 14 10/04/2022   AST 12 10/04/2022   ALKPHOS 77 10/04/2022   BILITOT 1.0 10/04/2022   Lab Results  Component Value Date   CHOL 141 10/04/2022   HDL 54 10/04/2022   LDLCALC 68 10/04/2022   TRIG 101 10/04/2022   CHOLHDL 3.6 05/13/2018    Lab Results  Component Value Date   HGBA1C 5.2 10/04/2022    Assessment & Plan    Chest pain: Previously noted chest pain in 2021 and again in early 2024. Chest pain in 2021 described as occassional sharp pain located to the left of the sternum, associated with activity and resolves with rest over an hour. Coronary CTA in 2021 showed normal coronary arteries with calcium score of 0. Her echo in 2/21 indicated LVEF of 55 to 60%, mildly elevated pulmonary artery pressures and trivial MV regurgitation. In January 2024 was reporting intermittent chest tightness, relieved with albuterol. Her Lexiscan Myoview in January 2024 was normal with no evidence of ischemia or scar, CT images showed only minimal coronary calcifications. Echo in 3/24 showed EF of 60 to 65%, Grade I diastolic dysfunction and mild MR, PA pressure unable to be assessed. Today she reports new substernal chest pressure that started two weeks ago, occurs following exertion and relieves with rest over a few minutes. Also accompanied with shortness of breath and some dizziness. With new chest pressure in setting of worsening shortness of breath and history of mildly elevated  pulmonary artery pressures in 2021 will have left and right cardiac catheterization. The patient understands that risks include but are not limited to stroke (1 in 1000), death (1 in 1000), kidney failure [usually temporary] (1 in 500), bleeding (1 in 200), allergic reaction [possibly serious] (1 in 200), and agrees to proceed.  Start Aspirin 81mg  daily in setting of new chest pressure. CBC and Bmet for pre-catheterization workup ordered.   Dyspnea on exertion: She has had worsening shortness of breath at rest and with exertion over the last three year. Prior cardiac workup noted above. Followed by Dr. Belia Heman. Previously tried Clinical cytogeneticist with no improvement, started Trelegy yesterday. Will have left and right heart catheterization to assess pulmonary pressures.   Hypertension: Blood pressure today initially 100/60, on recheck 104/66. At home her blood pressures average 120's/70's, occasionally will run lower per patient, reports she is asymptomatic. Continue losartan 100mg  daily and chlorthalidone 25mg  daily   Hyperlipidemia: Last LDL 68 in January 2024. Continue rosuvastatin 10mg  daily   OSA: History of OSA, she has  been unable to tolerate CPAP mask. Sleeps with 1L nasal cannula. Followed by Dr. Belia Heman.   Disposition: Follow up with Ward Givens, NP on 04/06/2023 at 3:35pm.      Rip Harbour, NP 03/07/2023, 4:41 PM

## 2023-03-06 NOTE — Progress Notes (Signed)
[facility] [place] Pulmonary Medicine Consultation      Date: 03/06/2023,   MRN# [id number] Tiffany Delacruz 05/07/1951     CHIEF COMPLAINT:   Follow up SOB   HISTORY OF PRESENT ILLNESS  72 y.o. female with history of hypertension, hyperlipidemia, Graves' disease with subsequent hypothyroidism, obstructive sleep apnea (not compliant with CPAP), GERD, and depression/anxiety  Patient with a previous diagnosis of obstructive sleep apnea however is noncompliant due to the fact that she hates the mask  Patient does not want to use CPAP mask anymore for sleep apnea Patient has a diagnosis of probable underlying COPD and overnight pulse oximetry shows nocturnal hypoxia Patient currently on 1 L nasal cannula at night  Patient has been having exertional chest pain for the last 2 months She has not revealed her new symptom to anyone She is frustrated that pulm function test have not been scheduled however I have explained to her that therapy has started for presumed COPD with Breztri and that pulmonary function test was not adjutant to diagnosis  With ongoing chest pressure with exertion I would recommend that we do not perform pulmonary function testing at this time Recommend cardiology evaluation as soon as possible   Encouraged proper weight management.  Important to get eight or more hours of sleep  Limiting the use of the computer and television before bedtime.  Decrease naps during the day, so night time sleep will become enhanced.  Limit caffeine, and sleep deprivation.  HTN, stroke, uncontrolled diabetes and heart failure are potential risk factors.  Risk of untreated sleep apnea including cardiac arrhthymias, stroke, DM, pulm HTN.   Patient has a longstanding history of secondhand smoke exposure I explained to patient that she has likely diagnosis of COPD  Patient has shortness of breath and exertional dyspnea Has been having these problems for several years   CARDIAC HISTORY   --------------------------------------------------------------------------------------------------   Cardiovascular History  Non-Invasive Evaluation(s): TTE (11/12/2019): Normal LV size and wall thickness.  LVEF 55-60% with normal wall motion and diastolic function.  Normal RV size and function with mild pulmonary hypertension (per report; RVSP 31 mmHg -> ULN).  No significant valvular abnormalities. Cardiac CTA (10/09/2019): Normal right dominant coronary arteries without CAD.  Coronary calcium score = 0.  Aortic atherosclerosis was noted.      PAST MEDICAL HISTORY   Past Medical History:  Diagnosis Date   Anginal pain (HCC)    Anxiety    Depression    GERD (gastroesophageal reflux disease)    Graves disease    Hyperlipidemia    Hypertension    Hypothyroidism    Obesity    Osteoarthritis    Sleep apnea    NO CPAP   Wheezing 07/14/2018     SURGICAL HISTORY   Past Surgical History:  Procedure Laterality Date   BACK SURGERY  1988   BREAST BIOPSY Right 2017   benign, with marker   CARPAL TUNNEL RELEASE Left 07/20/2017   Procedure: CARPAL TUNNEL RELEASE;  Surgeon: Donato Heinz, MD;  Location: [facility] ORS;  Service: Orthopedics;  Laterality: Left;   CARPAL TUNNEL RELEASE Right 06/05/2022   Procedure: CARPAL TUNNEL RELEASE;  Surgeon: Donato Heinz, MD;  Location: [facility] ORS;  Service: Orthopedics;  Laterality: Right;   cataracts surgery     COLONOSCOPY     disk repair     EYE SURGERY Bilateral 10/10/2018   JOINT REPLACEMENT Right 05/2013   knee replacement   TRIGGER FINGER RELEASE Left 2023     FAMILY  HISTORY   Family History  Problem Relation Age of Onset   Uterine cancer Mother    Colon cancer Mother    Heart disease Father    Alcohol abuse Father    Heart attack Father 8   Rheum arthritis Sister    Diabetes Maternal Grandmother    Rheum arthritis Brother      SOCIAL HISTORY   Social History   Tobacco Use   Smoking status: Never   Smokeless tobacco:  Never  Vaping Use   Vaping Use: Never used  Substance Use Topics   Alcohol use: No    Alcohol/week: 0.0 standard drinks of alcohol   Drug use: No     MEDICATIONS    Home Medication:  Current Outpatient Rx   Order #: [id number] Class: Historical Med   Order #: [id number] Class: Normal   Order #: [id number] Class: Historical Med   Order #: [id number] Class: Normal   Order #: [id number] Class: Historical Med   Order #: [id number] Class: Historical Med   Order #: [id number] Class: Normal   Order #: [id number] Class: Normal   Order #: [id number] Class: Normal   Order #: [id number] Class: Normal   Order #: [id number] Class: Historical Med   Order #: [id number] Class: Normal   Order #: [id number] Class: Sample   Order #: [id number] Class: Normal    Current Medication:  Current Outpatient Medications:    acetaminophen (TYLENOL) 650 MG CR tablet, Take 1,300 mg by mouth every 8 (eight) hours as needed for pain., Disp: , Rfl:    albuterol (VENTOLIN HFA) 108 (90 Base) MCG/ACT inhaler, TAKE 2 PUFFS BY MOUTH EVERY 6 HOURS AS NEEDED FOR WHEEZE OR SHORTNESS OF BREATH, Disp: 17 each, Rfl: 0   b complex vitamins tablet, Take 1 tablet by mouth daily., Disp: , Rfl:    chlorthalidone (HYGROTON) 25 MG tablet, Take 0.5 tablets (12.5 mg total) by mouth daily., Disp: 45 tablet, Rfl: 2   famotidine (PEPCID) 20 MG tablet, Take 20 mg by mouth as needed., Disp: , Rfl:    FLUoxetine (PROZAC) 40 MG capsule, Take 40 mg by mouth daily., Disp: , Rfl:    fluticasone (FLONASE) 50 MCG/ACT nasal spray, SPRAY 2 SPRAYS INTO EACH NOSTRIL EVERY DAY (Patient taking differently: as needed. SPRAY 2 SPRAYS INTO EACH NOSTRIL EVERY DAY), Disp: 48 mL, Rfl: 3   levothyroxine (SYNTHROID) 150 MCG tablet, Take 1 tablet (150 mcg total) by mouth daily., Disp: 90 tablet, Rfl: 3   losartan (COZAAR) 100 MG tablet, Take 1 tablet (100 mg total) by mouth daily., Disp: 90 tablet, Rfl: 4   mirabegron ER (MYRBETRIQ) 50 MG TB24 tablet, Take 1 tablet (50 mg total) by  mouth daily., Disp: 90 tablet, Rfl: 4   OVER THE COUNTER MEDICATION, as needed. Allergy eye drops, Disp: , Rfl:    rosuvastatin (CRESTOR) 10 MG tablet, Take 1 tablet (10 mg total) by mouth daily., Disp: 90 tablet, Rfl: 3   Budeson-Glycopyrrol-Formoterol (BREZTRI AEROSPHERE) 160-9-4.8 MCG/ACT AERO, Inhale 2 puffs into the lungs in the morning and at bedtime. (Patient not taking: Reported on 03/06/2023), Disp: 10.7 g, Rfl: 0   nitroGLYCERIN (NITROSTAT) 0.4 MG SL tablet, Place 1 tablet (0.4 mg total) under the tongue every 5 (five) minutes as needed for chest pain (maximum of 3 doses.)., Disp: 25 tablet, Rfl: 2    ALLERGIES   Patient has no known allergies.      BP 138/80 (BP Location: Left Arm, Cuff Size: Large)   Pulse 83   Temp (!) 97.5 F (36.4 C)   Ht 5'  5" (1.651 m)   Wt 249 lb 9.6 oz (113.2 kg)   LMP 12/15/1991 (Approximate)   SpO2 98%   BMI 41.54 kg/m       Review of Systems: Gen:  Denies  fever, sweats, chills weight loss  HEENT: Denies blurred vision, double vision, ear pain, eye pain, hearing loss, nose bleeds, sore throat Cardiac:  No dizziness, chest pain or heaviness, chest tightness,edema, No JVD Resp:   No cough, -sputum production, -shortness of breath,-wheezing, -hemoptysis,  Other:  All other systems negative   Physical Examination:   General Appearance: No distress  EYES PERRLA, EOM intact.   NECK Supple, No JVD Pulmonary: normal breath sounds, No wheezing.  CardiovascularNormal S1,S2.  No m/r/g.   Abdomen: Benign, Soft, non-tender. Neurology UE/LE 5/5 strength, no focal deficits Ext pulses intact, cap refill intact ALL OTHER ROS ARE NEGATIVE    ASSESSMENT/PLAN   72 year old pleasant white female seen today for longstanding secondhand smoke exposure with probable underlying COPD in the setting of deconditioned state respiratory insufficiency obesity with a diagnosis of OSA who is noncompliant  Presumed COPD Breztri did not help Albuterol  still helps We will start Trelegy 200 Avoid secondhand smoke Avoid SICK contacts Recommend  Masking  when appropriate Recommend Keep up-to-date with vaccinations  Chest pressure with exertion Patient needs cardiology evaluation Cardiologist on-call notified Appointment set up within 24 hours to be seen I have been in correspondence with the cardiologist on-call for significant amount of time trying to set up appointment as soon as possible   Chronic hypoxic respiratory failure from presumed COPD Continue oxygen as prescribed Patient use and benefits from therapy She needs this for survival  Obesity -recommend significant weight loss -recommend changing diet  Deconditioned state -Recommend increased daily activity and exercise   Recommend weight loss Patient current weight is 246 pounds seized up 50 pounds over the last 2 years   MEDICATION ADJUSTMENTS/LABS AND TESTS ORDERED: Recommend cardiology evaluation soon as possible due to new symptoms over the last several months related to chest pressure with exertion, CARDIOLOGY ON CALL HAS BEEN NOTIFIED  Continue oxygen as prescribed  Continue albuterol as needed Lets start Trelegy 200 inhaler therapy  Avoid secondhand smoke Avoid SICK contacts Recommend  Masking  when appropriate Recommend Keep up-to-date with vaccinations  DO NOT PERFORM PFT's at this time due to possible angina  CURRENT MEDICATIONS REVIEWED AT LENGTH WITH PATIENT TODAY   Patient  satisfied with Plan of action and management. All questions answered  Follow up  6 months  Total Time Spent  45 mins   Wallis Bamberg Santiago Glad, M.D.  [facility]  Medical Director [facility] Medical Director [facility] Cardio-Pulmonary Department

## 2023-03-06 NOTE — Progress Notes (Unsigned)
Office Visit    Patient Name: Tiffany Delacruz Date of Encounter: 03/07/2023  Primary Care Provider:  Marjie Skiff, NP Primary Cardiologist:  Tiffany Kendall, MD  Chief Complaint    72 year old female with past medical history of tension, hyperlipidemia, Graves' disease with subsequent hypothyroidism, obstructive sleep apnea (unable to tolerate CPAP), GERD and anxiety/depression.  Presents today for chest pain and shortness of breath.  Past Medical History    Past Medical History:  Diagnosis Date   Anginal pain (HCC)    Anxiety    Depression    GERD (gastroesophageal reflux disease)    Graves disease    Hyperlipidemia    Hypertension    Hypothyroidism    Obesity    Osteoarthritis    Sleep apnea    NO CPAP   Wheezing 07/14/2018   Past Surgical History:  Procedure Laterality Date   BACK SURGERY  1988   BREAST BIOPSY Right 2017   benign, with marker   CARPAL TUNNEL RELEASE Left 07/20/2017   Procedure: CARPAL TUNNEL RELEASE;  Surgeon: Donato Heinz, MD;  Location: [facility] ORS;  Service: Orthopedics;  Laterality: Left;   CARPAL TUNNEL RELEASE Right 06/05/2022   Procedure: CARPAL TUNNEL RELEASE;  Surgeon: Donato Heinz, MD;  Location: [facility] ORS;  Service: Orthopedics;  Laterality: Right;   cataracts surgery     COLONOSCOPY     disk repair     EYE SURGERY Bilateral 10/10/2018   JOINT REPLACEMENT Right 05/2013   knee replacement   TRIGGER FINGER RELEASE Left 2023   Allergies  No Known Allergies  Labs/Other Studies Reviewed    The following studies were reviewed today:  Cardiac Studies & Procedures     STRESS TESTS  NM MYOCAR MULTI W/SPECT W 10/17/2022  Narrative   The study is normal. The study is low risk.  Poor exercise capacity.  The test was changed to [facility] as a result.   LV perfusion is normal. There is no evidence of ischemia. There is no evidence of infarction.   Left ventricular function is normal. Nuclear stress EF: 71 %. The left ventricular  ejection fraction is hyperdynamic (>65%). End diastolic cavity size is normal. End systolic cavity size is normal.   CT attenuation images showed only minimal coronary calcifications.   ECHOCARDIOGRAM  ECHOCARDIOGRAM COMPLETE 12/15/2022  Narrative ECHOCARDIOGRAM REPORT    Patient Name:   Tiffany Delacruz Date of Exam: 12/15/2022 Medical Rec #:  [id number]      Height:       66.0 in Accession #:    [id number]     Weight:       246.2 lb Date of Birth:  Feb 11, 1951      BSA:          2.184 m Patient Age:    71 years       BP:           138/82 mmHg Patient Gender: F              HR:           84 bpm. Exam Location:  [place]  Procedure: 2D Echo, 3D Echo, Cardiac Doppler, Color Doppler and Strain Analysis  Indications:    R06.02 SOB; R07.9* Chest pain, unspecified  History:        Patient has prior history of Echocardiogram examinations, most recent 11/12/2019. Signs/Symptoms:Shortness of Breath and Chest Pain; Risk Factors:Sleep Apnea, Hypertension, Dyslipidemia and Non-Smoker.  Sonographer:    Quentin Ore RDMS, RVT, RDCS  Referring Phys: [id number] CHRISTOPHER END  IMPRESSIONS   1. Left ventricular ejection fraction, by estimation, is 60 to 65%. The left ventricle has normal function. The left ventricle has no regional wall motion abnormalities. Left ventricular diastolic parameters are consistent with Grade I diastolic dysfunction (impaired relaxation). The average left ventricular global longitudinal strain is -16.6 %. 2. Right ventricular systolic function is normal. The right ventricular size is normal. 3. Left atrial size was mildly dilated. 4. The mitral valve is normal in structure. Mild mitral valve regurgitation. No evidence of mitral stenosis. 5. The aortic valve is tricuspid. Aortic valve regurgitation is not visualized. No aortic stenosis is present. 6. There is borderline dilatation of the ascending aorta, measuring 37 mm. 7. The inferior vena cava is normal in size with  greater than 50% respiratory variability, suggesting right atrial pressure of 3 mmHg.  FINDINGS Left Ventricle: Left ventricular ejection fraction, by estimation, is 60 to 65%. The left ventricle has normal function. The left ventricle has no regional wall motion abnormalities. The average left ventricular global longitudinal strain is -16.6 %. The left ventricular internal cavity size was normal in size. There is no left ventricular hypertrophy. Left ventricular diastolic parameters are consistent with Grade I diastolic dysfunction (impaired relaxation).  Right Ventricle: The right ventricular size is normal. No increase in right ventricular wall thickness. Right ventricular systolic function is normal.  Left Atrium: Left atrial size was mildly dilated.  Right Atrium: Right atrial size was normal in size.  Pericardium: There is no evidence of pericardial effusion.  Mitral Valve: The mitral valve is normal in structure. Mild mitral valve regurgitation. No evidence of mitral valve stenosis.  Tricuspid Valve: The tricuspid valve is normal in structure. Tricuspid valve regurgitation is mild . No evidence of tricuspid stenosis.  Aortic Valve: The aortic valve is tricuspid. Aortic valve regurgitation is not visualized. No aortic stenosis is present. Aortic valve mean gradient measures 3.0 mmHg. Aortic valve peak gradient measures 5.2 mmHg. Aortic valve area, by VTI measures 3.22 cm.  Pulmonic Valve: The pulmonic valve was normal in structure. Pulmonic valve regurgitation is not visualized. No evidence of pulmonic stenosis.  Aorta: The aortic root is normal in size and structure. There is borderline dilatation of the ascending aorta, measuring 37 mm.  Venous: The inferior vena cava is normal in size with greater than 50% respiratory variability, suggesting right atrial pressure of 3 mmHg.  IAS/Shunts: No atrial level shunt detected by color flow Doppler.   LEFT VENTRICLE PLAX 2D LVIDd:          4.40 cm      Diastology LVIDs:         3.10 cm      LV e' medial:    3.99 cm/s LV PW:         0.90 cm      LV E/e' medial:  14.3 LV IVS:        1.20 cm      LV e' lateral:   10.10 cm/s LVOT diam:     2.10 cm      LV E/e' lateral: 5.7 LV SV:         74 LV SV Index:   34           2D Longitudinal Strain LVOT Area:     3.46 cm     2D Strain GLS Avg:     -16.6 %  LV Volumes (MOD) LV vol d, MOD A4C: 101.0 ml 3D Volume EF:  LV vol s, MOD A4C: 46.6 ml  3D EF:        52 % LV SV MOD A4C:     101.0 ml LV EDV:       117 ml LV ESV:       56 ml LV SV:        61 ml  RIGHT VENTRICLE             IVC RV Basal diam:  3.90 cm     IVC diam: 1.60 cm RV S prime:     12.40 cm/s TAPSE (M-mode): 2.8 cm  LEFT ATRIUM             Index        RIGHT ATRIUM           Index LA diam:        4.50 cm 2.06 cm/m   RA Area:     24.80 cm LA Vol (A2C):   60.4 ml 27.65 ml/m  RA Volume:   88.50 ml  40.51 ml/m LA Vol (A4C):   63.3 ml 28.98 ml/m LA Biplane Vol: 64.7 ml 29.62 ml/m AORTIC VALVE                    PULMONIC VALVE AV Area (Vmax):    3.17 cm     PV Vmax:          1.14 m/s AV Area (Vmean):   3.09 cm     PV Peak grad:     5.2 mmHg AV Area (VTI):     3.22 cm     PR End Diast Vel: 9.12 msec AV Vmax:           114.00 cm/s AV Vmean:          76.550 cm/s AV VTI:            0.230 m AV Peak Grad:      5.2 mmHg AV Mean Grad:      3.0 mmHg LVOT Vmax:         104.33 cm/s LVOT Vmean:        68.400 cm/s LVOT VTI:          0.213 m LVOT/AV VTI ratio: 0.93  AORTA Ao Root diam: 3.50 cm Ao Asc diam:  3.70 cm Ao Arch diam: 2.8 cm  MITRAL VALVE                TRICUSPID VALVE MV Area (PHT): 3.48 cm     TR Peak grad:   32.9 mmHg MV Decel Time: 218 msec     TR Vmax:        287.00 cm/s MV E velocity: 57.25 cm/s MV A velocity: 103.50 cm/s  SHUNTS MV E/A ratio:  0.55         Systemic VTI:  0.21 m Systemic Diam: 2.10 cm  Julien Nordmann MD Electronically signed by Julien Nordmann MD Signature Date/Time:  12/15/2022/4:40:55 PM    Final     CT SCANS  CT CORONARY MORPH W/CTA COR W/SCORE 10/09/2019  Addendum 10/09/2019  2:30 PM ADDENDUM REPORT: 10/09/2019 14:28  ADDENDUM: OVER-READ INTERPRETATION  CT CHEST  The following report is an over-read performed by radiologist Dr. Arliss Journey [facility] on 10/09/2019 at 2:20 p.m. This over-read does not include interpretation of cardiac or coronary anatomy or pathology. The coronary CTA interpretation by the cardiologist is attached.  COMPARISON:  Chest radiograph 08/05/2018.  Chest CT 06/23/2012  FINDINGS:  Vascular: Aortic atherosclerosis. No aortic dissection or aneurysm. No central pulmonary embolism, on this non-dedicated study.  Mediastinum/Nodes: No imaged mediastinal adenopathy. Upper normal sized right infrahilar nodes are likely reactive.  Lungs/Pleura: No imaged pleural fluid.  Lingular scarring.  Clear imaged lungs.  Upper Abdomen: Well-circumscribed low-density liver lesions are likely cysts. Segment 2 low-density liver lesion is too small to characterize but vaguely present back in 2013. Normal imaged portions of the spleen, stomach.  Musculoskeletal: No acute osseous abnormality. Mild right hemidiaphragm elevation.  IMPRESSION:  1.  No acute findings in the imaged extracardiac chest. 2.  Aortic Atherosclerosis (ICD10-I70.0).   Electronically Signed By: Jeronimo Greaves M.D. On: 10/09/2019 14:28  Narrative CLINICAL DATA:  Hx of htn and chestpain  EXAM: Cardiac/Coronary  CTA  TECHNIQUE: The patient was scanned on a Siemens Somatoform go.Top scanner.  FINDINGS: A retrospective scan was triggered in the descending thoracic aorta. Axial non-contrast 3 mm slices were carried out through the heart. The data set was analyzed on a dedicated work station and scored using the Agatson method. Gantry rotation speed was 330 msecs and collimation was .6 mm. 12.5mg  of metoprolol and 0.8 mg of sl NTG  was given. The 3D data set was reconstructed in 5% intervals of the 60-95 % of the R-R cycle. Diastolic phases were analyzed on a dedicated work station using MPR, MIP and VRT modes. The patient received 125 cc of contrast.  Aorta:  Normal size.  No calcifications.  No dissection.  Aortic Valve:  Trileaflet.  No calcifications.  Coronary Arteries:  Normal coronary origin.  Right dominance.  RCA is a large dominant artery that gives rise to PDA and PLA. There is no plaque.  Left main is a large artery that gives rise to LAD and LCX arteries.  LAD is a large vessel that has no plaque.  LCX is a non-dominant artery that gives rise to one large OM1 branch. There is no plaque.  Other findings:  Normal pulmonary vein drainage into the left atrium.  Normal left atrial appendage without a thrombus.  Normal size of the pulmonary artery.  IMPRESSION: 1. Coronary calcium score of 0.  2. Normal coronary origin with right dominance.  3. No evidence of CAD.  4. CAD-RADS 0. No evidence of CAD (0%). Consider non-atherosclerotic causes of chest pain.  Electronically Signed: By: Debbe Odea M.D. On: 10/09/2019 13:23         Recent Labs: 04/03/2022: Magnesium 1.7 10/04/2022: ALT 14; BUN 28; Creatinine, Ser 0.84; Hemoglobin 13.2; Platelets 190; Potassium 4.0; Sodium 140; TSH 0.678  Recent Lipid Panel    Component Value Date/Time   CHOL 141 10/04/2022 0951   CHOL 187 04/16/2015 0847   TRIG 101 10/04/2022 0951   TRIG 95 04/16/2015 0847   HDL 54 10/04/2022 0951   CHOLHDL 3.6 05/13/2018 1541   VLDL 19 04/16/2015 0847   LDLCALC 68 10/04/2022 0951    History of Present Illness     72 year old female with past medical history of hypertension, hyperlipidemia, Graves' disease with subsequent hypothyroidism, obstructive sleep apnea (unable to tolerate CPAP), GERD and anxiety/depression.  Presents today for chest pain and shortness of breath.  Tiffany Delacruz was first seen by Dr.  Okey Dupre in December 2020, at that time she was noting dyspnea on exertion and chest discomfort.  In January 2021 she had cardiac CTA that was normal with no evidence of coronary artery disease.  She then had an echocardiogram that showed normal LVEF and diastolic parameters  with borderline elevated PA pressure.  In January 2024 she was referred back to Dr. Okey Dupre for similar symptoms of chest discomfort and dyspnea on exertion that were worse compared to symptoms in 2020/2021.  Her nuclear stress test in January showed no evidence of ischemia or scar, poor exercise tolerance was noted. Her repeat echo in March showed normal LVEF with grade 1 diastolic dysfunction and mild MR, PA pressures were unable to be assessed. She was referred to pulmonology for further evaluation of possible COPD.  On follow-up in April with Dr. Okey Dupre she felt like her dyspnea on exertion had slightly improved with the start of Breztri.     Today she reports two weeks of intermittent substernal chest pressure and worsening shortness of breath following exertion, relieved with rest over a few minutes. She also reports intermittent dizziness with her shortness of breath. She denies palpitations, orthopnea, n, v, syncope, edema. Reports her blood pressures at home average 120's/70's, with occasional lower blood pressures with which she is asymptomatic.   Home Medications   Current Outpatient Medications  Medication Sig Dispense Refill   acetaminophen (TYLENOL) 650 MG CR tablet Take 1,300 mg by mouth every 8 (eight) hours as needed for pain.     albuterol (VENTOLIN HFA) 108 (90 Base) MCG/ACT inhaler TAKE 2 PUFFS BY MOUTH EVERY 6 HOURS AS NEEDED FOR WHEEZE OR SHORTNESS OF BREATH 17 each 0   b complex vitamins tablet Take 1 tablet by mouth daily.     chlorthalidone (HYGROTON) 25 MG tablet Take 0.5 tablets (12.5 mg total) by mouth daily. 45 tablet 2   famotidine (PEPCID) 20 MG tablet Take 20 mg by mouth as needed.     FLUoxetine (PROZAC) 40 MG  capsule Take 40 mg by mouth daily.     fluticasone (FLONASE) 50 MCG/ACT nasal spray SPRAY 2 SPRAYS INTO EACH NOSTRIL EVERY DAY (Patient taking differently: as needed. SPRAY 2 SPRAYS INTO EACH NOSTRIL EVERY DAY) 48 mL 3   Fluticasone-Umeclidin-Vilant (TRELEGY ELLIPTA) 200-62.5-25 MCG/ACT AEPB Inhale 1 Act into the lungs daily. 1 each 5   levothyroxine (SYNTHROID) 150 MCG tablet Take 1 tablet (150 mcg total) by mouth daily. 90 tablet 3   losartan (COZAAR) 100 MG tablet Take 1 tablet (100 mg total) by mouth daily. 90 tablet 4   Melatonin 10 MG CHEW Chew by mouth at bedtime.     mirabegron ER (MYRBETRIQ) 50 MG TB24 tablet Take 1 tablet (50 mg total) by mouth daily. 90 tablet 4   nitroGLYCERIN (NITROSTAT) 0.4 MG SL tablet Place 1 tablet (0.4 mg total) under the tongue every 5 (five) minutes as needed for chest pain (maximum of 3 doses.). 25 tablet 2   OVER THE COUNTER MEDICATION as needed. Allergy eye drops     rosuvastatin (CRESTOR) 10 MG tablet Take 1 tablet (10 mg total) by mouth daily. 90 tablet 3   No current facility-administered medications for this visit.    Review of Systems  She denies palpitations, pnd, orthopnea, n, v, syncope, or edema. All other systems reviewed and are otherwise negative except as noted above.     Physical Exam    VS:  BP 104/66   Pulse 79   Ht 5\' 6"  (1.676 m)   Wt 250 lb 8 oz (113.6 kg)   LMP 12/15/1991 (Approximate)   SpO2 99%   BMI 40.43 kg/m  , BMI Body mass index is 40.43 kg/m.     GEN: Well nourished, well developed, in no acute distress. HEENT:  normal. Neck: Supple, no JVD, carotid bruits, or masses. Cardiac: RRR, no murmurs, rubs, or gallops. No clubbing, cyanosis, edema.  Radials/DP/PT 2+ and equal bilaterally.  Respiratory:  Respirations regular and unlabored, clear to auscultation bilaterally. GI: Soft, nontender, nondistended, BS + x 4. MS: no deformity or atrophy. Skin: warm and dry, no rash. Neuro:  Strength and sensation are  intact. Psych: Normal affect.  Accessory Clinical Findings    ECG personally reviewed by me today - Normal sinus rhythm at 79bpm, no ST/Twave changes noted.   Lab Results  Component Value Date   WBC 6.0 10/04/2022   HGB 13.2 10/04/2022   HCT 40.2 10/04/2022   MCV 86 10/04/2022   PLT 190 10/04/2022   Lab Results  Component Value Date   CREATININE 0.84 10/04/2022   BUN 28 (H) 10/04/2022   NA 140 10/04/2022   K 4.0 10/04/2022   CL 100 10/04/2022   CO2 25 10/04/2022   Lab Results  Component Value Date   ALT 14 10/04/2022   AST 12 10/04/2022   ALKPHOS 77 10/04/2022   BILITOT 1.0 10/04/2022   Lab Results  Component Value Date   CHOL 141 10/04/2022   HDL 54 10/04/2022   LDLCALC 68 10/04/2022   TRIG 101 10/04/2022   CHOLHDL 3.6 05/13/2018    Lab Results  Component Value Date   HGBA1C 5.2 10/04/2022    Assessment & Plan    Chest pain: Previously noted chest pain in 2021 and again in early 2024. Chest pain in 2021 described as occassional sharp pain located to the left of the sternum, associated with activity and resolves with rest over an hour. Coronary CTA in 2021 showed normal coronary arteries with calcium score of 0. Her echo in 2/21 indicated LVEF of 55 to 60%, mildly elevated pulmonary artery pressures and trivial MV regurgitation. In January 2024 was reporting intermittent chest tightness, relieved with albuterol. Her Lexiscan Myoview in January 2024 was normal with no evidence of ischemia or scar, CT images showed only minimal coronary calcifications. Echo in 3/24 showed EF of 60 to 65%, Grade I diastolic dysfunction and mild MR, PA pressure unable to be assessed. Today she reports new substernal chest pressure that started two weeks ago, occurs following exertion and relieves with rest over a few minutes. Also accompanied with shortness of breath and some dizziness. With new chest pressure in setting of worsening shortness of breath and history of mildly elevated  pulmonary artery pressures in 2021 will have left and right cardiac catheterization. The patient understands that risks include but are not limited to stroke (1 in 1000), death (1 in 1000), kidney failure [usually temporary] (1 in 500), bleeding (1 in 200), allergic reaction [possibly serious] (1 in 200), and agrees to proceed.  Start Aspirin 81mg  daily in setting of new chest pressure. CBC and Bmet for pre-catheterization workup ordered.   Dyspnea on exertion: She has had worsening shortness of breath at rest and with exertion over the last three year. Prior cardiac workup noted above. Followed by Dr. Belia Heman. Previously tried Clinical cytogeneticist with no improvement, started Trelegy yesterday. Will have left and right heart catheterization to assess pulmonary pressures.   Hypertension: Blood pressure today initially 100/60, on recheck 104/66. At home her blood pressures average 120's/70's, occasionally will run lower per patient, reports she is asymptomatic. Continue losartan 100mg  daily and chlorthalidone 25mg  daily   Hyperlipidemia: Last LDL 68 in January 2024. Continue rosuvastatin 10mg  daily   OSA: History of OSA, she has  been unable to tolerate CPAP mask. Sleeps with 1L nasal cannula. Followed by Dr. Belia Heman.   Disposition: Follow up with Ward Givens, NP on 04/06/2023 at 3:35pm.      Rip Harbour, NP 03/07/2023, 4:41 PM

## 2023-03-07 ENCOUNTER — Ambulatory Visit: Payer: Medicare PPO | Attending: Nurse Practitioner | Admitting: Cardiology

## 2023-03-07 ENCOUNTER — Encounter: Payer: Self-pay | Admitting: Nurse Practitioner

## 2023-03-07 VITALS — BP 104/66 | HR 79 | Ht 66.0 in | Wt 250.5 lb

## 2023-03-07 DIAGNOSIS — I10 Essential (primary) hypertension: Secondary | ICD-10-CM | POA: Diagnosis not present

## 2023-03-07 DIAGNOSIS — I2089 Other forms of angina pectoris: Secondary | ICD-10-CM | POA: Diagnosis not present

## 2023-03-07 DIAGNOSIS — G4733 Obstructive sleep apnea (adult) (pediatric): Secondary | ICD-10-CM | POA: Diagnosis not present

## 2023-03-07 DIAGNOSIS — R0609 Other forms of dyspnea: Secondary | ICD-10-CM | POA: Diagnosis not present

## 2023-03-07 DIAGNOSIS — E782 Mixed hyperlipidemia: Secondary | ICD-10-CM | POA: Diagnosis not present

## 2023-03-07 DIAGNOSIS — R072 Precordial pain: Secondary | ICD-10-CM | POA: Diagnosis not present

## 2023-03-07 MED ORDER — ASPIRIN 81 MG PO TBEC: 81.0000 mg | DELAYED_RELEASE_TABLET | Freq: Every day | ORAL | 3 refills | Status: DC

## 2023-03-07 NOTE — Patient Instructions (Signed)
Medication Instructions:  START Aspirin 81 mg once daily  *If you need a refill on your cardiac medications before your next appointment, please call your pharmacy*   Lab Work: Your provider would like for you to have following labs drawn: CMET and CBC.   Please go to the [facility] entrance and check in at the front desk.  You do not need an appointment.  They are open from 7am-6 pm.   If you have labs (blood work) drawn today and your tests are completely normal, you will receive your results only by: MyChart Message (if you have MyChart) OR A paper copy in the mail If you have any lab test that is abnormal or we need to change your treatment, we will call you to review the results.   Testing/Procedures: Your physician has requested that you have a cardiac catheterization. Cardiac catheterization is used to diagnose and/or treat various heart conditions. Doctors may recommend this procedure for a number of different reasons. The most common reason is to evaluate chest pain. Chest pain can be a symptom of coronary artery disease (CAD), and cardiac catheterization can show whether plaque is narrowing or blocking your heart's arteries. This procedure is also used to evaluate the valves, as well as measure the blood flow and oxygen levels in different parts of your heart. For further information please visit [email or web address] Please follow instruction sheet, as given.    Follow-Up: At [facility], you and your health needs are our priority.  As part of our continuing mission to provide you with exceptional heart care, we have created designated Provider Care Teams.  These Care Teams include your primary Cardiologist (physician) and Advanced Practice Providers (APPs -  Physician Assistants and Nurse Practitioners) who all work together to provide you with the care you need, when you need it.  We recommend signing up for the patient portal called "MyChart".  Sign up  information is provided on this After Visit Summary.  MyChart is used to connect with patients for Virtual Visits (Telemedicine).  Patients are able to view lab/test results, encounter notes, upcoming appointments, etc.  Non-urgent messages can be sent to your provider as well.   To learn more about what you can do with MyChart, go to [email or web address].    Your next appointment:   2-3 week(s)  Provider:   You may see Yvonne Kendall, MD or one of the following Advanced Practice Providers on your designated Care Team:   Nicolasa Ducking, NP Eula Listen, PA-C Cadence Fransico Michael, PA-C Charlsie Quest, NP    Other Instructions  [facility] A DEPT OF [place]. [facility] AT [facility] [address] [place] [address] Dept: [phone] Loc: [phone]  Tiffany Delacruz  03/07/2023  You are scheduled for a Cardiac Catheterization on Monday, June 17 with Dr. Cristal Deer End.  1. Please arrive at the [facility] Entrance of [facility], [address], [place] [address] at 8:30 AM (This is 1 hour(s) prior to your procedure time).  Proceed to the Check-In Desk directly inside the entrance.  Procedure Parking: Use the entrance off of the [address] side of the hospital. Turn right upon entering and follow the driveway to parking that is directly in front of the [facility]. There is no valet parking available at this entrance, however there is an awning directly in front of the [facility] Center for drop off/ pick up for  patients.  Special note: Every effort is made to have your procedure done on time. Please understand that emergencies sometimes delay scheduled procedures.  2. Diet: Do not eat solid foods after midnight.  The patient may have clear liquids until 5am upon the day of the procedure.  3. Labs: You will need to have blood drawn on 03/07/23  4. Medication instructions in preparation  for your procedure: Hold the Chlorthalidone the morning of the procedure  On the morning of your procedure, take your Aspirin 81 mg and any morning medicines NOT listed above.  You may use sips of water.  5. Plan to go home the same day, you will only stay overnight if medically necessary. 6. Bring a current list of your medications and current insurance cards. 7. You MUST have a responsible person to drive you home. 8. Someone MUST be with you the first 24 hours after you arrive home or your discharge will be delayed. 9. Please wear clothes that are easy to get on and off and wear slip-on shoes.  Thank you for allowing [place] to care for you!   -- [facility]

## 2023-03-08 ENCOUNTER — Other Ambulatory Visit
Admission: RE | Admit: 2023-03-08 | Discharge: 2023-03-08 | Disposition: A | Discharge status: Home / Self Care | Payer: Medicare PPO | Source: Ambulatory Visit | Attending: Nurse Practitioner | Admitting: Nurse Practitioner

## 2023-03-08 ENCOUNTER — Telehealth: Payer: Self-pay | Admitting: *Deleted

## 2023-03-08 ENCOUNTER — Other Ambulatory Visit: Payer: Self-pay | Admitting: Nurse Practitioner

## 2023-03-08 DIAGNOSIS — I2089 Other forms of angina pectoris: Secondary | ICD-10-CM | POA: Insufficient documentation

## 2023-03-08 DIAGNOSIS — R0609 Other forms of dyspnea: Secondary | ICD-10-CM | POA: Insufficient documentation

## 2023-03-08 LAB — COMPREHENSIVE METABOLIC PANEL
ALT: 13 U/L (ref 0–44)
AST: 16 U/L (ref 15–41)
Albumin: 3.8 g/dL (ref 3.5–5.0)
Alkaline Phosphatase: 63 U/L (ref 38–126)
Anion gap: 9 (ref 5–15)
BUN: 19 mg/dL (ref 8–23)
CO2: 25 mmol/L (ref 22–32)
Calcium: 8.9 mg/dL (ref 8.9–10.3)
Chloride: 95 mmol/L — ABNORMAL LOW (ref 98–111)
Creatinine, Ser: 0.83 mg/dL (ref 0.44–1.00)
GFR, Estimated: >60 mL/min (ref >60)
Glucose, Bld: 97 mg/dL (ref 70–99)
Potassium: 4.5 mmol/L (ref 3.5–5.1)
Sodium: 129 mmol/L — ABNORMAL LOW (ref 135–145)
Total Bilirubin: 0.9 mg/dL (ref 0.3–1.2)
Total Protein: 6.8 g/dL (ref 6.5–8.1)

## 2023-03-08 LAB — CBC
HCT: 36.9 % (ref 36.0–46.0)
Hemoglobin: 12.7 g/dL (ref 12.0–15.0)
MCH: 28.4 pg (ref 26.0–34.0)
MCHC: 34.4 g/dL (ref 30.0–36.0)
MCV: 82.6 fL (ref 80.0–100.0)
Platelets: 222 10*3/uL (ref 150–400)
RBC: 4.47 MIL/uL (ref 3.87–5.11)
RDW: 13.6 % (ref 11.5–15.5)
WBC: 6.3 10*3/uL (ref 4.0–10.5)
nRBC: 0 % (ref 0.0–0.2)

## 2023-03-08 NOTE — Telephone Encounter (Signed)
The patient has been called and made aware that her Right and Left Heart cath will be with Dr. Okey Dupre on Wednesday 6/19 with arrival time of 11:30 am.   You are scheduled for a Cardiac Catheterization on Wednesday June 19 with Dr. Cristal Deer End.   1. Please arrive at the [facility] Entrance of [facility], [address], [place] [address] at 11:30 AM (This is 1 hour(s) prior to your procedure time).  Proceed to the Check-In Desk directly inside the entrance.   Procedure Parking: Use the entrance off of the [address] side of the hospital. Turn right upon entering and follow the driveway to parking that is directly in front of the [facility] Vascular Center. There is no valet parking available at this entrance, however there is an awning directly in front of the [facility] Vascular Center for drop off/ pick up for patients.   Special note: Every effort is made to have your procedure done on time. Please understand that emergencies sometimes delay scheduled procedures.   2. Diet: Do not eat solid foods after midnight.  The patient may have clear liquids until 5am upon the day of the procedure.   3. Labs: You will need to have blood drawn on 03/07/23   4. Medication instructions in preparation for your procedure: Hold the Chlorthalidone the morning of the procedure   On the morning of your procedure, take your Aspirin 81 mg and any morning medicines NOT listed above.  You may use sips of water.   5. Plan to go home the same day, you will only stay overnight if medically necessary. 6. Bring a current list of your medications and current insurance cards. 7. You MUST have a responsible person to drive you home. 8. Someone MUST be with you the first 24 hours after you arrive home or your discharge will be delayed. 9. Please wear clothes that are easy to get on and off and wear slip-on shoes.   Thank you for allowing [place] to care for you!   -- [facility]

## 2023-03-09 NOTE — Telephone Encounter (Signed)
Requested Prescriptions  Pending Prescriptions Disp Refills   MYRBETRIQ 50 MG TB24 tablet [Pharmacy Med Name: MYRBETRIQ ER 50 MG TABLET] 90 tablet 4    Sig: TAKE 1 TABLET BY MOUTH EVERY DAY     Urology: Bladder Agents - mirabegron Passed - 03/08/2023  4:39 PM      Passed - Cr in normal range and within 360 days    Creatinine  Date Value Ref Range Status  06/18/2013 0.96 0.60 - 1.30 mg/dL Final   Creatinine, Ser  Date Value Ref Range Status  03/08/2023 0.83 0.44 - 1.00 mg/dL Final         Passed - ALT in normal range and within 360 days    ALT  Date Value Ref Range Status  03/08/2023 13 0 - 44 U/L Final   SGPT (ALT)  Date Value Ref Range Status  06/23/2012 16 12 - 78 U/L Final         Passed - AST in normal range and within 360 days    AST  Date Value Ref Range Status  03/08/2023 16 15 - 41 U/L Final   SGOT(AST)  Date Value Ref Range Status  06/23/2012 12 (L) 15 - 37 Unit/L Final         Passed - eGFR is 15 or above and within 360 days    EGFR (African American)  Date Value Ref Range Status  06/18/2013 >60  Final   GFR calc Af Amer  Date Value Ref Range Status  10/28/2020 78 >59 mL/min/1.73 Final    Comment:    **In accordance with recommendations from the NKF-ASN Task force,**   [facility] is in the process of updating its eGFR calculation to the   2021 CKD-EPI creatinine equation that estimates kidney function   without a race variable.    EGFR (Non-African Amer.)  Date Value Ref Range Status  06/18/2013 >60  Final    Comment:    eGFR values <28mL/min/1.73 m2 may be an indication of chronic kidney disease (CKD). Calculated eGFR is useful in patients with stable renal function. The eGFR calculation will not be reliable in acutely ill patients when serum creatinine is changing rapidly. It is not useful in  patients on dialysis. The eGFR calculation may not be applicable to patients at the low and high extremes of body sizes, pregnant women, and vegetarians.     GFR, Estimated  Date Value Ref Range Status  03/08/2023 >60 >60 mL/min Final    Comment:    (NOTE) Calculated using the CKD-EPI Creatinine Equation (2021)    eGFR  Date Value Ref Range Status  10/04/2022 73 >59 mL/min/1.73 Final         Passed - Last BP in normal range    BP Readings from Last 1 Encounters:  03/07/23 104/66         Passed - Valid encounter within last 12 months    Recent Outpatient Visits           1 month ago Essential hypertension   [place] [facility] [place], Corrie Dandy T, NP   5 months ago Medicare annual wellness visit, subsequent   [place] [facility] [place], Seaside T, NP   11 months ago Recurrent major depressive disorder, in full remission (HCC)   [place] [facility] [place], Corrie Dandy T, NP   1 year ago Recurrent major depressive disorder, in full remission [facility])   [place] [facility] [place], Dorie Rank, NP  1 year ago Acute cough   [place] [facility], Jake Church, NP       Future Appointments             In 3 weeks [place], Dorie Rank, NP [place] [facility]   In 4 weeks Brion Aliment, Dois Davenport, NP [facility] [place]   In 2 months Erin Fulling, MD [place] [facility]

## 2023-03-14 ENCOUNTER — Ambulatory Visit
Admission: RE | Admit: 2023-03-14 | Discharge: 2023-03-14 | Disposition: A | Discharge status: Home / Self Care | Payer: Medicare PPO | Source: Ambulatory Visit | Attending: Internal Medicine | Admitting: Internal Medicine

## 2023-03-14 ENCOUNTER — Other Ambulatory Visit: Payer: Self-pay

## 2023-03-14 ENCOUNTER — Encounter: Payer: Self-pay | Admitting: Internal Medicine

## 2023-03-14 ENCOUNTER — Encounter
Admission: RE | Disposition: A | Discharge status: Home / Self Care | Payer: Self-pay | Source: Ambulatory Visit | Attending: Internal Medicine

## 2023-03-14 DIAGNOSIS — E039 Hypothyroidism, unspecified: Secondary | ICD-10-CM | POA: Insufficient documentation

## 2023-03-14 DIAGNOSIS — R0602 Shortness of breath: Secondary | ICD-10-CM | POA: Diagnosis not present

## 2023-03-14 DIAGNOSIS — K219 Gastro-esophageal reflux disease without esophagitis: Secondary | ICD-10-CM | POA: Insufficient documentation

## 2023-03-14 DIAGNOSIS — F419 Anxiety disorder, unspecified: Secondary | ICD-10-CM | POA: Insufficient documentation

## 2023-03-14 DIAGNOSIS — E785 Hyperlipidemia, unspecified: Secondary | ICD-10-CM | POA: Diagnosis not present

## 2023-03-14 DIAGNOSIS — Z7982 Long term (current) use of aspirin: Secondary | ICD-10-CM | POA: Diagnosis not present

## 2023-03-14 DIAGNOSIS — F32A Depression, unspecified: Secondary | ICD-10-CM | POA: Insufficient documentation

## 2023-03-14 DIAGNOSIS — G4733 Obstructive sleep apnea (adult) (pediatric): Secondary | ICD-10-CM | POA: Diagnosis not present

## 2023-03-14 DIAGNOSIS — Z79899 Other long term (current) drug therapy: Secondary | ICD-10-CM | POA: Insufficient documentation

## 2023-03-14 DIAGNOSIS — R072 Precordial pain: Secondary | ICD-10-CM

## 2023-03-14 DIAGNOSIS — R0609 Other forms of dyspnea: Secondary | ICD-10-CM

## 2023-03-14 HISTORY — PX: RIGHT/LEFT HEART CATH AND CORONARY ANGIOGRAPHY: CATH118266

## 2023-03-14 LAB — POCT I-STAT 7, (LYTES, BLD GAS, ICA,H+H)
Acid-Base Excess: 1 mmol/L (ref 0.0–2.0)
Bicarbonate: 26.5 mmol/L (ref 20.0–28.0)
Calcium, Ion: 1.16 mmol/L (ref 1.15–1.40)
HCT: 35 % — ABNORMAL LOW (ref 36.0–46.0)
Hemoglobin: 11.9 g/dL — ABNORMAL LOW (ref 12.0–15.0)
O2 Saturation: 93 %
Potassium: 4.2 mmol/L (ref 3.5–5.1)
Sodium: 130 mmol/L — ABNORMAL LOW (ref 135–145)
TCO2: 28 mmol/L (ref 22–32)
pCO2 arterial: 46.1 mmHg (ref 32–48)
pH, Arterial: 7.368 (ref 7.35–7.45)
pO2, Arterial: 70 mmHg — ABNORMAL LOW (ref 83–108)

## 2023-03-14 SURGERY — RIGHT/LEFT HEART CATH AND CORONARY ANGIOGRAPHY
Anesthesia: Moderate Sedation | Laterality: Bilateral

## 2023-03-14 MED ORDER — SODIUM CHLORIDE 0.9% FLUSH: 3.0000 mL | INTRAVENOUS | Status: DC | PRN

## 2023-03-14 MED ORDER — HEPARIN (PORCINE) IN NACL 2000-0.9 UNIT/L-% IV SOLN
INTRAVENOUS | Status: DC | PRN
  Administered 2023-03-14: 1000 mL

## 2023-03-14 MED ORDER — ONDANSETRON HCL 4 MG/2ML IJ SOLN: 4.0000 mg | Freq: Four times a day (QID) | INTRAMUSCULAR | Status: DC | PRN

## 2023-03-14 MED ORDER — SODIUM CHLORIDE 0.9 % IV SOLN: 250.0000 mL | INTRAVENOUS | Status: DC | PRN

## 2023-03-14 MED ORDER — IOHEXOL 300 MG/ML  SOLN
INTRAMUSCULAR | Status: DC | PRN
  Administered 2023-03-14: 42 mL

## 2023-03-14 MED ORDER — FENTANYL CITRATE (PF) 100 MCG/2ML IJ SOLN
INTRAMUSCULAR | Status: DC | PRN
  Administered 2023-03-14 (×2): 25 ug via INTRAVENOUS

## 2023-03-14 MED ORDER — MIDAZOLAM HCL 2 MG/2ML IJ SOLN
INTRAMUSCULAR | Status: DC | PRN
  Administered 2023-03-14 (×2): 1 mg via INTRAVENOUS

## 2023-03-14 MED ORDER — ASPIRIN 81 MG PO CHEW
81.0000 mg | CHEWABLE_TABLET | ORAL | Status: AC
  Administered 2023-03-14: 81 mg via ORAL

## 2023-03-14 MED ORDER — HEPARIN (PORCINE) IN NACL 1000-0.9 UT/500ML-% IV SOLN
INTRAVENOUS | Status: AC
  Filled 2023-03-14: qty 1000

## 2023-03-14 MED ORDER — HEPARIN SODIUM (PORCINE) 1000 UNIT/ML IJ SOLN
INTRAMUSCULAR | Status: AC
  Filled 2023-03-14: qty 10

## 2023-03-14 MED ORDER — SODIUM CHLORIDE 0.9% FLUSH: 3.0000 mL | Freq: Two times a day (BID) | INTRAVENOUS | Status: DC

## 2023-03-14 MED ORDER — SODIUM CHLORIDE 0.9 % IV SOLN: INTRAVENOUS | Status: DC

## 2023-03-14 MED ORDER — ACETAMINOPHEN 325 MG PO TABS: 650.0000 mg | ORAL_TABLET | ORAL | Status: DC | PRN

## 2023-03-14 MED ORDER — ASPIRIN 81 MG PO CHEW
CHEWABLE_TABLET | ORAL | Status: AC
  Filled 2023-03-14: qty 1

## 2023-03-14 MED ORDER — SODIUM CHLORIDE 0.9 % IV SOLN
250.0000 mL | INTRAVENOUS | Status: DC | PRN
  Administered 2023-03-14: 1000 mL via INTRAVENOUS

## 2023-03-14 MED ORDER — VERAPAMIL HCL 2.5 MG/ML IV SOLN
INTRAVENOUS | Status: DC | PRN
  Administered 2023-03-14 (×2): 2.5 mg via INTRA_ARTERIAL

## 2023-03-14 MED ORDER — VERAPAMIL HCL 2.5 MG/ML IV SOLN
INTRAVENOUS | Status: AC
  Filled 2023-03-14: qty 2

## 2023-03-14 MED ORDER — LABETALOL HCL 5 MG/ML IV SOLN: 10.0000 mg | INTRAVENOUS | Status: DC | PRN

## 2023-03-14 MED ORDER — HEPARIN SODIUM (PORCINE) 1000 UNIT/ML IJ SOLN
INTRAMUSCULAR | Status: DC | PRN
  Administered 2023-03-14: 5000 [IU] via INTRAVENOUS

## 2023-03-14 MED ORDER — HYDRALAZINE HCL 20 MG/ML IJ SOLN: 10.0000 mg | INTRAMUSCULAR | Status: DC | PRN

## 2023-03-14 MED ORDER — MIDAZOLAM HCL 2 MG/2ML IJ SOLN
INTRAMUSCULAR | Status: AC
  Filled 2023-03-14: qty 2

## 2023-03-14 MED ORDER — FENTANYL CITRATE (PF) 100 MCG/2ML IJ SOLN
INTRAMUSCULAR | Status: AC
  Filled 2023-03-14: qty 2

## 2023-03-14 MED ORDER — LIDOCAINE HCL 1 % IJ SOLN
INTRAMUSCULAR | Status: AC
  Filled 2023-03-14: qty 20

## 2023-03-14 SURGICAL SUPPLY — 14 items
CATH BALLN WEDGE 5F 110CM (CATHETERS) IMPLANT
CATH INFINITI AMBI 5FR TG (CATHETERS) IMPLANT
DEVICE RAD TR BAND REGULAR (VASCULAR PRODUCTS) IMPLANT
DRAPE BRACHIAL (DRAPES) IMPLANT
DRAPE FEMORAL ANGIO W/ POUCH (DRAPES) IMPLANT
GLIDESHEATH SLEND SS 6F .021 (SHEATH) IMPLANT
GUIDEWIRE INQWIRE 1.5J.035X260 (WIRE) IMPLANT
INQWIRE 1.5J .035X260CM (WIRE) ×1
PACK CARDIAC CATH (CUSTOM PROCEDURE TRAY) ×1 IMPLANT
PROTECTION STATION PRESSURIZED (MISCELLANEOUS) ×1
SET ATX-X65L (MISCELLANEOUS) IMPLANT
SHEATH GLIDE SLENDER 4/5FR (SHEATH) IMPLANT
STATION PROTECTION PRESSURIZED (MISCELLANEOUS) IMPLANT
WIRE HITORQ VERSACORE ST 145CM (WIRE) IMPLANT

## 2023-03-14 NOTE — Interval H&P Note (Signed)
History and Physical Interval Note:  03/14/2023 12:35 PM  Tiffany Delacruz  has presented today for surgery, with the diagnosis of chest pain and shortness of breath.  The various methods of treatment have been discussed with the patient and family. After consideration of risks, benefits and other options for treatment, the patient has consented to  Procedure(s): RIGHT/LEFT HEART CATH AND CORONARY ANGIOGRAPHY (Bilateral) as a surgical intervention.  The patient's history has been reviewed, patient examined, no change in status, stable for surgery.  I have reviewed the patient's chart and labs.  Questions were answered to the patient's satisfaction.    Cath Lab Visit (complete for each Cath Lab visit)  Clinical Evaluation Leading to the Procedure:   ACS: No.  Non-ACS:    Anginal Classification: CCS III  Anti-ischemic medical therapy: No Therapy  Non-Invasive Test Results: Low-risk stress test findings: cardiac mortality <1%/year  Prior CABG: No previous CABG  Tiffany Delacruz

## 2023-03-15 ENCOUNTER — Encounter: Payer: Self-pay | Admitting: Internal Medicine

## 2023-03-15 LAB — POCT I-STAT EG7
Acid-Base Excess: 1 mmol/L (ref 0.0–2.0)
Bicarbonate: 27.6 mmol/L (ref 20.0–28.0)
Calcium, Ion: 1.17 mmol/L (ref 1.15–1.40)
HCT: 36 % (ref 36.0–46.0)
Hemoglobin: 12.2 g/dL (ref 12.0–15.0)
O2 Saturation: 67 %
Potassium: 4.1 mmol/L (ref 3.5–5.1)
Sodium: 130 mmol/L — ABNORMAL LOW (ref 135–145)
TCO2: 29 mmol/L (ref 22–32)
pCO2, Ven: 48.6 mmHg (ref 44–60)
pH, Ven: 7.362 (ref 7.25–7.43)
pO2, Ven: 37 mmHg (ref 32–45)

## 2023-03-16 DIAGNOSIS — J449 Chronic obstructive pulmonary disease, unspecified: Secondary | ICD-10-CM | POA: Diagnosis not present

## 2023-03-22 ENCOUNTER — Other Ambulatory Visit: Payer: Self-pay | Admitting: Nurse Practitioner

## 2023-03-22 NOTE — Telephone Encounter (Signed)
Requested Prescriptions  Pending Prescriptions Disp Refills   levothyroxine (SYNTHROID) 150 MCG tablet [Pharmacy Med Name: LEVOTHYROXINE 150 MCG TABLET] 90 tablet 0    Sig: TAKE 1 TABLET BY MOUTH EVERY DAY     Endocrinology:  Hypothyroid Agents Passed - 03/22/2023  2:39 AM      Passed - TSH in normal range and within 360 days    TSH  Date Value Ref Range Status  10/04/2022 0.678 0.450 - 4.500 uIU/mL Final         Passed - Valid encounter within last 12 months    Recent Outpatient Visits           2 months ago Essential hypertension   [place] [facility] [place], Leland T, NP   5 months ago Medicare annual wellness visit, subsequent   [place] [facility] [place], [place] T, NP   11 months ago Recurrent major depressive disorder, in full remission (HCC)   [place] [facility] [place], Corrie Dandy T, NP   1 year ago Recurrent major depressive disorder, in full remission (HCC)   [place] [facility] [place], Corrie Dandy T, NP   1 year ago Acute cough   [place] [facility] [place], Jake Church, NP       Future Appointments             Tomorrow Noemi Chapel, NP [place] [facility] [place]   In 1 week [place], Dorie Rank, NP [place] [facility]   In 2 weeks Brion Aliment, Dois Davenport, NP [place] [facility] [place]   In 2 months Erin Fulling, MD [facility] at [facility]

## 2023-03-23 ENCOUNTER — Ambulatory Visit: Payer: Medicare PPO | Admitting: Nurse Practitioner

## 2023-03-23 ENCOUNTER — Encounter: Payer: Self-pay | Admitting: Nurse Practitioner

## 2023-03-23 VITALS — BP 118/70 | HR 83 | Temp 97.8°F | Ht 66.0 in | Wt 251.4 lb

## 2023-03-23 DIAGNOSIS — G4733 Obstructive sleep apnea (adult) (pediatric): Secondary | ICD-10-CM | POA: Diagnosis not present

## 2023-03-23 DIAGNOSIS — R0609 Other forms of dyspnea: Secondary | ICD-10-CM

## 2023-03-23 DIAGNOSIS — J449 Chronic obstructive pulmonary disease, unspecified: Secondary | ICD-10-CM | POA: Diagnosis not present

## 2023-03-23 DIAGNOSIS — R0602 Shortness of breath: Secondary | ICD-10-CM | POA: Diagnosis not present

## 2023-03-23 DIAGNOSIS — E662 Morbid (severe) obesity with alveolar hypoventilation: Secondary | ICD-10-CM

## 2023-03-23 NOTE — Progress Notes (Unsigned)
@Patient  ID: Tiffany Delacruz, female    DOB: [birth year], 72 y.o.   MRN: [id number]  Chief Complaint  Patient presents with   Follow-up    SOB. No wheezing and cough.    Referring provider: Marjie Skiff, NP  HPI: 71 year old female, never smoker followed for OSA intolerant CPAP and DOE. She is a patient of Dr. Clovis Fredrickson and last seen in office 03/06/2023. Past medical history significant for HTN, GERD, hypothyroid, depression, HLD.   TEST/EVENTS:  10/09/2019 cardiac CT: atherosclerosis. No LAD. Visualized lungs are clear. Lingular scarring. Low density liver lesions, cysts. Mild right hemidiaphragm elevation. Coronary calcium 0. No evidence of CAD; CAD-RADS 0. 10/17/2022 normal stress test; EF 71% 12/15/2022 echo: EF 60-65%, GIDD. RV size and function nl. LA mildly dilated. Mild MR.  03/14/2023 LHC/RHC: normal left and right pressures. Borderline elevated PASP. Normal CO/CI. No obstructive disease.   03/06/2023: OV with Dr. Belia Heman. Noncompliant with CPAP due to hating the mask. On 1 lpm [place] at night. Probable diagnosis of underlying COPD. Having exertional chest pain for the last 2 months. Hold off on PFTs until she has further workup for this - referred urgently to cardiology. Change from [facility] to Trelegy. Recommend weight loss measures.   03/23/2023: Today - follow up Patient presents today for follow up. She's still having trouble with her breathing. She had left and right heart cath without any significant findings. She had borderline elevated pulmonary artery pressures. She has had normal stress testing as well. She has not had PFTs as of yet. She tells me that she can't do anything around the house without getting out of breath. She has trouble walking longer distances. She doesn't notice a difference with the Trelegy. She denies any cough, chest congestion, wheezing, leg swelling, orthopnea, PND, palpitations. She does have some exertional chest pressure/pain; workup has been unremarkable  thus far. She would like to start working on weight loss measures. She has not had a repeat sleep study since 1991. She tells me her sleep apnea was mild at the time. She couldn't tolerate the CPAP mask. She wears oxygen at night 1 lpm. She has daytime fatigue and snoring. Denies any drowsy driving or morning headaches.   No Known Allergies  Immunization History  Administered Date(s) Administered   Fluad Quad(high Dose 65+) 06/27/2019, 10/28/2020, 08/26/2021, 10/04/2022   Influenza, High Dose Seasonal PF 07/17/2016   Influenza,inj,Quad PF,6+ Mos 10/20/2015   Influenza-Unspecified 07/09/2014   PFIZER(Purple Top)SARS-COV-2 Vaccination 05/30/2020, 06/20/2020   Pneumococcal Conjugate-13 10/20/2015   Pneumococcal Polysaccharide-23 06/24/2012, 11/09/2017   Td 09/25/2001   Tdap 12/23/2012    Past Medical History:  Diagnosis Date   Anginal pain (HCC)    Anxiety    Depression    GERD (gastroesophageal reflux disease)    Graves disease    Hyperlipidemia    Hypertension    Hypothyroidism    Obesity    Osteoarthritis    Sleep apnea    NO CPAP   Wheezing 07/14/2018    Tobacco History: Social History   Tobacco Use  Smoking Status Never  Smokeless Tobacco Never   Counseling given: Not Answered   Outpatient Medications Prior to Visit  Medication Sig Dispense Refill   acetaminophen (TYLENOL) 650 MG CR tablet Take 1,300 mg by mouth every 8 (eight) hours as needed for pain.     albuterol (VENTOLIN HFA) 108 (90 Base) MCG/ACT inhaler TAKE 2 PUFFS BY MOUTH EVERY 6 HOURS AS NEEDED FOR WHEEZE OR SHORTNESS OF BREATH  17 each 0   b complex vitamins tablet Take 1 tablet by mouth daily.     chlorthalidone (HYGROTON) 25 MG tablet Take 0.5 tablets (12.5 mg total) by mouth daily. 45 tablet 2   famotidine (PEPCID) 20 MG tablet Take 20 mg by mouth as needed.     FLUoxetine (PROZAC) 40 MG capsule Take 40 mg by mouth daily.     fluticasone (FLONASE) 50 MCG/ACT nasal spray SPRAY 2 SPRAYS INTO EACH  NOSTRIL EVERY DAY (Patient taking differently: as needed. SPRAY 2 SPRAYS INTO EACH NOSTRIL EVERY DAY) 48 mL 3   levothyroxine (SYNTHROID) 150 MCG tablet TAKE 1 TABLET BY MOUTH EVERY DAY 90 tablet 0   losartan (COZAAR) 100 MG tablet Take 1 tablet (100 mg total) by mouth daily. 90 tablet 4   Melatonin 10 MG CHEW Chew by mouth at bedtime.     MYRBETRIQ 50 MG TB24 tablet TAKE 1 TABLET BY MOUTH EVERY DAY 90 tablet 4   OVER THE COUNTER MEDICATION as needed. Allergy eye drops     rosuvastatin (CRESTOR) 10 MG tablet Take 1 tablet (10 mg total) by mouth daily. 90 tablet 3   Fluticasone-Umeclidin-Vilant (TRELEGY ELLIPTA) 200-62.5-25 MCG/ACT AEPB Inhale 1 Act into the lungs daily. (Patient not taking: Reported on 03/23/2023) 1 each 5   nitroGLYCERIN (NITROSTAT) 0.4 MG SL tablet Place 1 tablet (0.4 mg total) under the tongue every 5 (five) minutes as needed for chest pain (maximum of 3 doses.). 25 tablet 2   No facility-administered medications prior to visit.     Review of Systems:   Constitutional: No weight loss or gain, night sweats, fevers, chills, or lassitude. +fatigue  HEENT: No headaches, difficulty swallowing, tooth/dental problems, or sore throat. No sneezing, itching, ear ache, nasal congestion, or post nasal drip CV:  No chest pain, orthopnea, PND, swelling in lower extremities, anasarca, dizziness, palpitations, syncope Resp: +shortness of breath with exertion. No excess mucus or change in color of mucus. No productive or non-productive. No hemoptysis. No wheezing.  No chest wall deformity GI:  No heartburn, indigestion, abdominal pain, nausea, vomiting, diarrhea, change in bowel habits, loss of appetite, bloody stools.  GU: No dysuria, change in color of urine, urgency or frequency.  Skin: No rash, lesions, ulcerations MSK:  No joint pain or swelling.   Neuro: No dizziness or lightheadedness.  Psych: No depression or anxiety. Mood stable. +sleep disturbance    Physical Exam:  BP  118/70 (BP Location: Left Arm, Cuff Size: Large)   Pulse 83   Temp 97.8 F (36.6 C)   Ht 5\' 6"  (1.676 m)   Wt 251 lb 6.4 oz (114 kg)   LMP 12/15/1991 (Approximate)   SpO2 100%   BMI 40.58 kg/m   GEN: Pleasant, interactive, well-kempt; morbidly obese; in no acute distress. HEENT:  Normocephalic and atraumatic. PERRLA. Sclera white. Nasal turbinates pink, moist and patent bilaterally. No rhinorrhea present. Oropharynx pink and moist, without exudate or edema. No lesions, ulcerations, or postnasal drip.  NECK:  Supple w/ fair ROM. No JVD present. Normal carotid impulses w/o bruits. Thyroid symmetrical with no goiter or nodules palpated. No lymphadenopathy.   CV: RRR, no m/r/g, no peripheral edema. Pulses intact, +2 bilaterally. No cyanosis, pallor or clubbing. PULMONARY:  Unlabored, regular breathing. Clear bilaterally A&P w/o wheezes/rales/rhonchi. No accessory muscle use.  GI: BS present and normoactive. Soft, non-tender to palpation. No organomegaly or masses detected.  MSK: No erythema, warmth or tenderness. Cap refil <2 sec all extrem. No deformities or  joint swelling noted.  Neuro: A/Ox3. No focal deficits noted.   Skin: Warm, no lesions or rashe Psych: Normal affect and behavior. Judgement and thought content appropriate.     Lab Results:  CBC    Component Value Date/Time   WBC 6.3 03/08/2023 0837   RBC 4.47 03/08/2023 0837   HGB 11.9 (L) 03/14/2023 1437   HGB 13.2 10/04/2022 0951   HCT 35.0 (L) 03/14/2023 1437   HCT 40.2 10/04/2022 0951   PLT 222 03/08/2023 0837   PLT 190 10/04/2022 0951   MCV 82.6 03/08/2023 0837   MCV 86 10/04/2022 0951   MCV 82 06/02/2013 0923   MCH 28.4 03/08/2023 0837   MCHC 34.4 03/08/2023 0837   RDW 13.6 03/08/2023 0837   RDW 13.3 10/04/2022 0951   RDW 13.9 06/02/2013 0923   LYMPHSABS 1.8 10/04/2022 0951   LYMPHSABS 2.2 06/23/2012 1802   MONOABS 0.4 08/05/2018 1525   MONOABS 0.9 06/23/2012 1802   EOSABS 0.3 10/04/2022 0951   EOSABS 0.1  06/23/2012 1802   BASOSABS 0.0 10/04/2022 0951   BASOSABS 0.1 06/23/2012 1802    BMET    Component Value Date/Time   NA 130 (L) 03/14/2023 1437   NA 140 10/04/2022 0951   NA 132 (L) 06/18/2013 0458   K 4.2 03/14/2023 1437   K 3.6 06/18/2013 0458   CL 95 (L) 03/08/2023 0837   CL 100 06/18/2013 0458   CO2 25 03/08/2023 0837   CO2 28 06/18/2013 0458   GLUCOSE 97 03/08/2023 0837   GLUCOSE 101 (H) 06/18/2013 0458   BUN 19 03/08/2023 0837   BUN 28 (H) 10/04/2022 0951   BUN 14 06/18/2013 0458   CREATININE 0.83 03/08/2023 0837   CREATININE 0.96 06/18/2013 0458   CALCIUM 8.9 03/08/2023 0837   CALCIUM 8.2 (L) 06/18/2013 0458   GFRNONAA >60 03/08/2023 0837   GFRNONAA >60 06/18/2013 0458   GFRAA 78 10/28/2020 1024   GFRAA >60 06/18/2013 0458    BNP    Component Value Date/Time   BNP 29.4 06/27/2019 0925     Imaging:  CARDIAC CATHETERIZATION  Result Date: 03/14/2023 Conclusions: No angiographically significant coronary artery disease. Normal left and right heart filling pressures. Borderline elevated pulmonary artery pressure. Normal Fick cardiac output/index. Recommendations: No obvious findings on catheterization for the patient's progressive dyspnea and precordial pain.  Consider further evaluation for non-cardiac causes of symptoms. Primary prevention of coronary artery disease. Yvonne Kendall, MD [facility]         No data to display          No results found for: "NITRICOXIDE"      Assessment & Plan:   Shortness of breath Likely multifactorial related to deconditioning, obesity, and possibly untreated OSA. Clinical suspicion for COPD; however, she does not seem to be responsive to inhaler use. Since her LHC/RHC did not show any obstructive disease, will move forward with PFTs. Her cath did not reveal any significant PH that would explain her DOE. Encouraged her on healthy weight loss measures as I believe this is a playing a large role in her activity  intolerance. Referral to medical weight management placed. She did have a mild elevation of her right hemidiaphragm on previous cardiac CT. Will obtain CXR today - could consider SNIFF test if evident on exam. Walk test today without desaturations but did become visibly dyspneic. I also recommended repeating a sleep study to re-evaluate the degree of her OSA. No anemia and nl TSH on recent labs.  Patient Instructions  Continue Albuterol inhaler 2 puffs every 6 hours as needed for shortness of breath or wheezing. Notify if symptoms persist despite rescue inhaler/neb use. Continue Trelegy 1 puff daily. Brush tongue and rinse mouth afterwards. Stop your inhaler a few days before your PFTs.   Split night sleep study - someone will contact you for scheduling  Next available pulmonary function testing - I will call you to go over results  Referral to medical weight management  Follow up in 6 weeks with Dr. Belia Heman or Philis Nettle. If symptoms do not improve or worsen, please contact office for sooner follow up or seek emergency care.    OSA (obstructive sleep apnea) She has snoring, excessive daytime sleepiness, hx of OSA. BMI 40.5. Given this,  I am concerned she still has sleep disordered breathing with obstructive sleep apnea. She will need sleep study for further evaluation.    - discussed how weight can impact sleep and risk for sleep disordered breathing - discussed options to assist with weight loss: combination of diet modification, cardiovascular and strength training exercises   - had an extensive discussion regarding the adverse health consequences related to untreated sleep disordered breathing - specifically discussed the risks for hypertension, coronary artery disease, cardiac dysrhythmias, cerebrovascular disease, and diabetes - lifestyle modification discussed   - discussed how sleep disruption can increase risk of accidents, particularly when driving - safe driving practices were  discussed  Morbid obesity (HCC) BMI 40.5. Referral to medical weight management.   COPD (chronic obstructive pulmonary disease) (HCC) Clinical diagnosis of COPD previously given. She is a never smoker but has had significant secondhand exposure. She does not seem to have response to inhaler therapies. See above plan. Action plan in place   I spent 42 minutes of dedicated to the care of this patient on the date of this encounter to include pre-visit review of records, face-to-face time with the patient discussing conditions above, post visit ordering of testing, clinical documentation with the electronic health record, making appropriate referrals as documented, and communicating necessary findings to members of the patients care team.  Noemi Chapel, NP 03/26/2023  Pt aware and understands NP's role.

## 2023-03-23 NOTE — Patient Instructions (Addendum)
Continue Albuterol inhaler 2 puffs every 6 hours as needed for shortness of breath or wheezing. Notify if symptoms persist despite rescue inhaler/neb use. Continue Trelegy 1 puff daily. Brush tongue and rinse mouth afterwards. Stop your inhaler a few days before your PFTs.   Split night sleep study - someone will contact you for scheduling  Next available pulmonary function testing - I will call you to go over results  Referral to medical weight management  Follow up in 6 weeks with Dr. Belia Heman or Philis Nettle. If symptoms do not improve or worsen, please contact office for sooner follow up or seek emergency care.

## 2023-03-26 ENCOUNTER — Encounter: Payer: Self-pay | Admitting: Nurse Practitioner

## 2023-03-26 DIAGNOSIS — J4530 Mild persistent asthma, uncomplicated: Secondary | ICD-10-CM | POA: Insufficient documentation

## 2023-03-26 DIAGNOSIS — J449 Chronic obstructive pulmonary disease, unspecified: Secondary | ICD-10-CM | POA: Insufficient documentation

## 2023-03-26 NOTE — Assessment & Plan Note (Signed)
She has snoring, excessive daytime sleepiness, hx of OSA. BMI 40.5. Given this,  I am concerned she still has sleep disordered breathing with obstructive sleep apnea. She will need sleep study for further evaluation.    - discussed how weight can impact sleep and risk for sleep disordered breathing - discussed options to assist with weight loss: combination of diet modification, cardiovascular and strength training exercises   - had an extensive discussion regarding the adverse health consequences related to untreated sleep disordered breathing - specifically discussed the risks for hypertension, coronary artery disease, cardiac dysrhythmias, cerebrovascular disease, and diabetes - lifestyle modification discussed   - discussed how sleep disruption can increase risk of accidents, particularly when driving - safe driving practices were discussed

## 2023-03-26 NOTE — Assessment & Plan Note (Signed)
BMI 40.5. Referral to medical weight management.

## 2023-03-26 NOTE — Assessment & Plan Note (Addendum)
Likely multifactorial related to deconditioning, obesity, and possibly untreated OSA. Clinical suspicion for COPD; however, she does not seem to be responsive to inhaler use. Since her LHC/RHC did not show any obstructive disease, will move forward with PFTs. Her cath did not reveal any significant PH that would explain her DOE. Encouraged her on healthy weight loss measures as I believe this is a playing a large role in her activity intolerance. Referral to medical weight management placed. She did have a mild elevation of her right hemidiaphragm on previous cardiac CT. Will obtain CXR today - could consider SNIFF test if evident on exam. Walk test today without desaturations but did become visibly dyspneic. I also recommended repeating a sleep study to re-evaluate the degree of her OSA. No anemia and nl TSH on recent labs.   Patient Instructions  Continue Albuterol inhaler 2 puffs every 6 hours as needed for shortness of breath or wheezing. Notify if symptoms persist despite rescue inhaler/neb use. Continue Trelegy 1 puff daily. Brush tongue and rinse mouth afterwards. Stop your inhaler a few days before your PFTs.   Split night sleep study - someone will contact you for scheduling  Next available pulmonary function testing - I will call you to go over results  Referral to medical weight management  Follow up in 6 weeks with Dr. Belia Heman or Philis Nettle. If symptoms do not improve or worsen, please contact office for sooner follow up or seek emergency care.

## 2023-03-26 NOTE — Assessment & Plan Note (Signed)
Clinical diagnosis of COPD previously given. She is a never smoker but has had significant secondhand exposure. She does not seem to have response to inhaler therapies. See above plan. Action plan in place

## 2023-03-27 ENCOUNTER — Ambulatory Visit
Admission: RE | Admit: 2023-03-27 | Discharge: 2023-03-27 | Disposition: A | Discharge status: Home / Self Care | Payer: Medicare PPO | Source: Ambulatory Visit | Attending: Nurse Practitioner | Admitting: Nurse Practitioner

## 2023-03-27 ENCOUNTER — Ambulatory Visit (INDEPENDENT_AMBULATORY_CARE_PROVIDER_SITE_OTHER): Payer: Medicare PPO

## 2023-03-27 DIAGNOSIS — R0609 Other forms of dyspnea: Secondary | ICD-10-CM | POA: Insufficient documentation

## 2023-03-27 DIAGNOSIS — R0602 Shortness of breath: Secondary | ICD-10-CM | POA: Diagnosis not present

## 2023-03-27 MED ORDER — ALBUTEROL SULFATE (2.5 MG/3ML) 0.083% IN NEBU
2.5000 mg | INHALATION_SOLUTION | Freq: Once | RESPIRATORY_TRACT | Status: AC
  Administered 2023-03-27: 2.5 mg via RESPIRATORY_TRACT
  Filled 2023-03-27: qty 3

## 2023-03-30 ENCOUNTER — Other Ambulatory Visit: Payer: Self-pay | Admitting: Nurse Practitioner

## 2023-03-30 NOTE — Progress Notes (Signed)
Please notify patient that I reviewed her PFTs. She has a lot of difficulties with the test but from what I can see, her lung function is normal and so are her lung volumes. She does not have a formal diagnosis of COPD based on these results. She should continue with our workup for sleep apnea and follow up with Dr. Belia Heman as scheduled. Thanks.

## 2023-03-30 NOTE — Telephone Encounter (Signed)
Requested Prescriptions  Pending Prescriptions Disp Refills   rosuvastatin (CRESTOR) 10 MG tablet [Pharmacy Med Name: ROSUVASTATIN CALCIUM 10 MG TAB] 90 tablet 0    Sig: TAKE 1 TABLET BY MOUTH EVERY DAY     Cardiovascular:  Antilipid - Statins 2 Failed - 03/30/2023  2:09 AM      Failed - Lipid Panel in normal range within the last 12 months    Cholesterol, Total  Date Value Ref Range Status  10/04/2022 141 100 - 199 mg/dL Final   Cholesterol Piccolo, Waived  Date Value Ref Range Status  04/16/2015 187 <200 mg/dL Final    Comment:                            Desirable                <200                         Borderline High      200- 239                         High                     >239    LDL Chol Calc (NIH)  Date Value Ref Range Status  10/04/2022 68 0 - 99 mg/dL Final   HDL  Date Value Ref Range Status  10/04/2022 54 >39 mg/dL Final   Triglycerides  Date Value Ref Range Status  10/04/2022 101 0 - 149 mg/dL Final   Triglycerides Piccolo,Waived  Date Value Ref Range Status  04/16/2015 95 <150 mg/dL Final    Comment:                            Normal                   <150                         Borderline High     150 - 199                         High                200 - 499                         Very High                >499          Passed - Cr in normal range and within 360 days    Creatinine  Date Value Ref Range Status  06/18/2013 0.96 0.60 - 1.30 mg/dL Final   Creatinine, Ser  Date Value Ref Range Status  03/08/2023 0.83 0.44 - 1.00 mg/dL Final         Passed - Patient is not pregnant      Passed - Valid encounter within last 12 months    Recent Outpatient Visits           2 months ago Essential hypertension   [place] [facility] [place], Corrie Dandy T, NP   5 months ago Medicare annual wellness visit, subsequent   [place] [facility] [place], Jolene T, NP   12 months ago Recurrent major depressive disorder,  in full remission (HCC)   [place] [facility] [place], Corrie Dandy T, NP   1 year ago Recurrent major depressive disorder, in full remission (HCC)   [place] [facility] [place], Corrie Dandy T, NP   1 year ago Acute cough   [place] [facility] [place], Jake Church, NP       Future Appointments             In 4 days Marjie Skiff, NP [place] [facility]   In 1 week Brion Aliment, Dois Davenport, NP [facility] [place]   In 1 month Erin Fulling, MD [facility] at [facility]

## 2023-03-31 ENCOUNTER — Other Ambulatory Visit: Payer: Self-pay | Admitting: Nurse Practitioner

## 2023-03-31 DIAGNOSIS — J3089 Other allergic rhinitis: Secondary | ICD-10-CM

## 2023-03-31 NOTE — Patient Instructions (Signed)
Shortness of Breath, Adult Shortness of breath means you have trouble breathing. Shortness of breath could be a sign of a medical problem. Follow these instructions at home:  Pollution Do not smoke or use any products that contain nicotine or tobacco. If you need help quitting, ask your doctor. Avoid things that can make it harder to breathe, such as: Smoke of all kinds. This includes smoke from campfires or forest fires. Do not smoke or allow others to smoke in your home. Mold. Dust. Air pollution. Chemical smells. Things that can give you an allergic reaction (allergens) if you have allergies. Keep your living space clean. Use products that help remove mold and dust. General instructions Watch for any changes in your symptoms. Take over-the-counter and prescription medicines only as told by your doctor. This includes oxygen therapy and inhaled medicines. Rest as needed. Return to your normal activities when your doctor says that it is safe. Keep all follow-up visits. Contact a doctor if: Your condition does not get better as soon as expected. You have a hard time doing your normal activities, even after you rest. You have new symptoms. You cannot walk up stairs. You cannot exercise the way you normally do. Get help right away if: Your shortness of breath gets worse. You have trouble breathing when you are resting. You feel light-headed or you faint. You have a cough that is not helped by medicines. You cough up blood. You have pain with breathing. You have pain in your chest, arms, shoulders, or belly (abdomen). You have a fever. These symptoms may be an emergency. Get help right away. Call 911. Do not wait to see if the symptoms will go away. Do not drive yourself to the hospital. Summary Shortness of breath is when you have trouble breathing enough air. It can be a sign of a medical problem. Avoid things that make it hard for you to breathe, such as smoking, pollution,  mold, and dust. Watch for any changes in your symptoms. Contact your doctor if you do not get better or you get worse. This information is not intended to replace advice given to you by your health care provider. Make sure you discuss any questions you have with your health care provider. Document Revised: 04/30/2021 Document Reviewed: 04/30/2021 Elsevier Patient Education  2024 Elsevier Inc.  

## 2023-04-02 LAB — PULMONARY FUNCTION TEST ARMC ONLY
DL/VA % pred: 88 %
DL/VA: 3.59 ml/min/mmHg/L
DLCO unc % pred: 62 %
DLCO unc: 12.84 ml/min/mmHg
FEF 25-75 Post: 2.99 L/sec
FEF 25-75 Pre: 2.32 L/sec
FEF2575-%Change-Post: 29 %
FEF2575-%Pred-Post: 159 %
FEF2575-%Pred-Pre: 123 %
FEV1-%Change-Post: 5 %
FEV1-%Pred-Post: 95 %
FEV1-%Pred-Pre: 90 %
FEV1-Post: 2.27 L
FEV1-Pre: 2.14 L
FEV1FVC-%Change-Post: -1 %
FEV1FVC-%Pred-Pre: 108 %
FEV6-%Change-Post: 7 %
FEV6-%Pred-Post: 93 %
FEV6-%Pred-Pre: 87 %
FEV6-Post: 2.8 L
FEV6-Pre: 2.61 L
FEV6FVC-%Pred-Post: 104 %
FEV6FVC-%Pred-Pre: 104 %
FVC-%Change-Post: 7 %
FVC-%Pred-Post: 89 %
FVC-%Pred-Pre: 83 %
FVC-Post: 2.8 L
FVC-Pre: 2.61 L
Post FEV1/FVC ratio: 81 %
Post FEV6/FVC ratio: 100 %
Pre FEV1/FVC ratio: 82 %
Pre FEV6/FVC Ratio: 100 %
RV % pred: 82 %
RV: 1.94 L
TLC % pred: 90 %
TLC: 4.84 L

## 2023-04-02 NOTE — Telephone Encounter (Signed)
Requested Prescriptions  Pending Prescriptions Disp Refills   fluticasone (FLONASE) 50 MCG/ACT nasal spray [Pharmacy Med Name: FLUTICASONE PROP 50 MCG SPRAY] 48 mL 1    Sig: SPRAY 2 SPRAYS INTO EACH NOSTRIL EVERY DAY     Ear, Nose, and Throat: Nasal Preparations - Corticosteroids Passed - 03/31/2023  9:20 AM      Passed - Valid encounter within last 12 months    Recent Outpatient Visits           2 months ago Essential hypertension   [place] [facility] [place], Corrie Dandy T, NP   6 months ago Medicare annual wellness visit, subsequent   [place] [facility] [place], [place] T, NP   12 months ago Recurrent major depressive disorder, in full remission (HCC)   [place] [facility] [place], Corrie Dandy T, NP   1 year ago Recurrent major depressive disorder, in full remission (HCC)   [place] [facility] [place], Corrie Dandy T, NP   1 year ago Acute cough   [place] [facility] [place], Jake Church, NP       Future Appointments             Tomorrow Marjie Skiff, NP [facility]   In 4 days Brion Aliment, Dois Davenport, NP [place] [facility] [place]   In 1 month Erin Fulling, MD [facility] at [facility]

## 2023-04-03 ENCOUNTER — Encounter: Payer: Self-pay | Admitting: Nurse Practitioner

## 2023-04-03 ENCOUNTER — Ambulatory Visit: Payer: Medicare PPO | Admitting: Nurse Practitioner

## 2023-04-03 VITALS — BP 111/74 | HR 73 | Temp 97.9°F | Ht 62.09 in | Wt 251.8 lb

## 2023-04-03 DIAGNOSIS — M79604 Pain in right leg: Secondary | ICD-10-CM

## 2023-04-03 DIAGNOSIS — E039 Hypothyroidism, unspecified: Secondary | ICD-10-CM | POA: Diagnosis not present

## 2023-04-03 DIAGNOSIS — F3342 Major depressive disorder, recurrent, in full remission: Secondary | ICD-10-CM

## 2023-04-03 DIAGNOSIS — E782 Mixed hyperlipidemia: Secondary | ICD-10-CM

## 2023-04-03 DIAGNOSIS — J449 Chronic obstructive pulmonary disease, unspecified: Secondary | ICD-10-CM

## 2023-04-03 DIAGNOSIS — R0602 Shortness of breath: Secondary | ICD-10-CM

## 2023-04-03 DIAGNOSIS — I2089 Other forms of angina pectoris: Secondary | ICD-10-CM | POA: Diagnosis not present

## 2023-04-03 DIAGNOSIS — I700 Atherosclerosis of aorta: Secondary | ICD-10-CM

## 2023-04-03 DIAGNOSIS — I10 Essential (primary) hypertension: Secondary | ICD-10-CM | POA: Diagnosis not present

## 2023-04-03 DIAGNOSIS — M79605 Pain in left leg: Secondary | ICD-10-CM

## 2023-04-03 DIAGNOSIS — G4733 Obstructive sleep apnea (adult) (pediatric): Secondary | ICD-10-CM | POA: Diagnosis not present

## 2023-04-03 NOTE — Assessment & Plan Note (Signed)
BMI 45.93.  Recommend to continue this.  Recommended eating smaller high protein, low fat meals more frequently and exercising 30 mins a day 5 times a week with a goal of 10-15lb weight loss in the next 3 months. Patient voiced their understanding and motivation to adhere to these recommendations.

## 2023-04-03 NOTE — Assessment & Plan Note (Signed)
Chronic, stable.  BP at goal today.  Recommend she monitor BP at least a few mornings a week at home and document.  DASH diet at home.  Continue current medication regimen and adjust as needed.  Labs today: CBC and CMP today.

## 2023-04-03 NOTE — Assessment & Plan Note (Signed)
Chronic, stable.  Denies SI/HI.  Continue current medication regimen and adjust as needed. 

## 2023-04-03 NOTE — Progress Notes (Signed)
BP 111/74   Pulse 73   Temp 97.9 F (36.6 C) (Oral)   Ht 5' 2.09" (1.577 m)   Wt 251 lb 12.8 oz (114.2 kg)   LMP 12/15/1991 (Approximate)   SpO2 98%   BMI 45.93 kg/m    Subjective:    Patient ID: Tiffany Delacruz, female    DOB: 07/04/1951, 72 y.o.   MRN: [id number]  HPI: Tiffany Delacruz is a 72 y.o. female  Chief Complaint  Patient presents with   Hypertension   Mood   Over Active Bladder   HYPERTENSION / HYPERLIPIDEMIA Taking Losartan 100 MG daily + Chlorthalidone.  Continues on Rosuvastatin daily without issue.  Has ongoing fatigue, SOB, and discomfort to legs with walking (can swim and ride stationary bike without symptoms), edema to both legs -- she is concerned for PAD due to what she has read online and her symptoms.  Does have history of lower back surgery for ruptured disc in 1988.    Saw cardiology last 03/07/23 with heart cath performed 03/14/23.  Pulmonary last 03/23/23 -- reassuring assessment overall in regard to her SOB among cardiology and pulmonary.  Is using Trelegy inhaler daily which is offering no benefit per her report.  Aortic atherosclerosis noted on past imaging.  Has sleep study on July 23rd scheduled -- in past has reported she would not wear mask if needed, but does endorse her exhaustion is so bad that she would now wear a CPAP if needed.   Satisfied with current treatment? yes Duration of hypertension: chronic BP monitoring frequency: ever couple days BP range: 120/60-80 range BP medication side effects: no Duration of hyperlipidemia: chronic Aspirin: no Recent stressors: no Recurrent headaches: no Visual changes: no Palpitations: no Dyspnea: with exertion, any type of movement, even short distances Chest pain: no Lower extremity edema: on occasion at baseline Dizzy/lightheaded: no The 10-year ASCVD risk score (Arnett DK, et al., 2019) is: 12.5%   Values used to calculate the score:     Age: 34 years     Sex: Female     Is Non-Hispanic  African American: No     Diabetic: No     Tobacco smoker: No     Systolic Blood Pressure: 111 mmHg     Is BP treated: Yes     HDL Cholesterol: 54 mg/dL     Total Cholesterol: 141 mg/dL  HYPOTHYROIDISM Continues on Levothyroxine 150 MCG.  Thyroid control status:stable Satisfied with current treatment? yes Medication side effects: no Medication compliance: good compliance Etiology of hypothyroidism:  Recent dose adjustment: none Fatigue: yes Cold intolerance: no Heat intolerance: no Weight gain: no Weight loss: no Constipation:yes Diarrhea/loose stools: no Palpitations: no Lower extremity edema: occasionly Anxiety/depressed mood: no  DEPRESSION Continues on Prozac daily. Mood status: stable Satisfied with current treatment?: yes Symptom severity: moderate  Duration of current treatment : chronic Side effects: no Medication compliance: good compliance Depressed mood: no Anxious mood: no Anhedonia: no Significant weight loss or gain: no Insomnia: no Fatigue: yes Feelings of worthlessness or guilt: no Impaired concentration/indecisiveness: no Suicidal ideations: no Hopelessness: no Crying spells: no    04/03/2023    2:09 PM 01/12/2023    4:04 PM 10/04/2022   10:22 AM 04/03/2022    9:37 AM 08/26/2021    8:59 AM  Depression screen PHQ 2/9  Decreased Interest 0 0 1 2 0  Down, Depressed, Hopeless 0 0 0 2 0  PHQ - 2 Score 0 0 1 4 0  Altered sleeping 0  0 1 0 0  Tired, decreased energy 3 0 3 1 0  Change in appetite 0 2 0 1 0  Feeling bad or failure about yourself  0 0 0 0 0  Trouble concentrating 0 0 0 0 0  Moving slowly or fidgety/restless 0 0 0 0 0  Suicidal thoughts 0 0 0 0 0  PHQ-9 Score 3 2 5 6  0  Difficult doing work/chores Extremely dIfficult Somewhat difficult Not difficult at all Not difficult at all Not difficult at all       04/03/2023    2:09 PM 01/12/2023    4:04 PM 10/04/2022   10:22 AM 04/03/2022    9:37 AM  GAD 7 : Generalized Anxiety Score  Nervous,  Anxious, on Edge 0 0 0 0  Control/stop worrying 0 0 0 0  Worry too much - different things 0 0 0 0  Trouble relaxing 0 0 0 0  Restless 0 0 0 0  Easily annoyed or irritable 0 0 0 0  Afraid - awful might happen 0 0 0 0  Total GAD 7 Score 0 0 0 0  Anxiety Difficulty Not difficult at all Not difficult at all Not difficult at all    Relevant past medical, surgical, family and social history reviewed and updated as indicated. Interim medical history since our last visit reviewed. Allergies and medications reviewed and updated.  Review of Systems  Constitutional:  Positive for fatigue. Negative for activity change, appetite change, diaphoresis and fever.  Respiratory:  Positive for shortness of breath. Negative for cough, chest tightness and wheezing.   Cardiovascular:  Positive for leg swelling. Negative for chest pain and palpitations.  Gastrointestinal: Negative.   Endocrine: Negative for cold intolerance and heat intolerance.  Genitourinary: Negative.   Musculoskeletal:  Positive for arthralgias.  Neurological: Negative.   Psychiatric/Behavioral: Negative.      Per HPI unless specifically indicated above     Objective:    BP 111/74   Pulse 73   Temp 97.9 F (36.6 C) (Oral)   Ht 5' 2.09" (1.577 m)   Wt 251 lb 12.8 oz (114.2 kg)   LMP 12/15/1991 (Approximate)   SpO2 98%   BMI 45.93 kg/m   Wt Readings from Last 3 Encounters:  04/03/23 251 lb 12.8 oz (114.2 kg)  03/23/23 251 lb 6.4 oz (114 kg)  03/14/23 250 lb (113.4 kg)    Physical Exam Vitals and nursing note reviewed.  Constitutional:      General: She is awake. She is not in acute distress.    Appearance: She is well-developed and well-groomed. She is obese. She is not ill-appearing.  HENT:     Head: Normocephalic.     Right Ear: Hearing normal. No drainage.     Left Ear: Hearing normal. No drainage.  Eyes:     General: Lids are normal.        Right eye: No discharge.        Left eye: No discharge.      Conjunctiva/sclera: Conjunctivae normal.     Pupils: Pupils are equal, round, and reactive to light.  Neck:     Thyroid: No thyromegaly.     Vascular: No carotid bruit.  Cardiovascular:     Rate and Rhythm: Normal rate and regular rhythm.     Heart sounds: Normal heart sounds. No murmur heard.    No gallop.     Comments: Compression hose in place with edema noted. Pulmonary:     Effort:  Pulmonary effort is normal. No accessory muscle usage or respiratory distress.     Breath sounds: Normal breath sounds.  Abdominal:     General: Bowel sounds are normal. There is no distension.     Palpations: Abdomen is soft.     Tenderness: There is no abdominal tenderness.  Musculoskeletal:     Cervical back: Normal range of motion and neck supple.     Right lower leg: 1+ Edema present.     Left lower leg: 1+ Edema present.  Lymphadenopathy:     Head:     Right side of head: No submental, submandibular, tonsillar, preauricular or posterior auricular adenopathy.     Left side of head: No submental, submandibular, tonsillar, preauricular or posterior auricular adenopathy.     Cervical: No cervical adenopathy.  Skin:    General: Skin is warm and dry.  Neurological:     Mental Status: She is alert and oriented to person, place, and time.     Gait: Gait is intact.     Deep Tendon Reflexes: Reflexes are normal and symmetric.     Reflex Scores:      Brachioradialis reflexes are 2+ on the right side and 2+ on the left side.      Patellar reflexes are 2+ on the right side and 2+ on the left side. Psychiatric:        Attention and Perception: Attention normal.        Mood and Affect: Mood normal.        Speech: Speech normal.        Behavior: Behavior normal. Behavior is cooperative.        Thought Content: Thought content normal.        Judgment: Judgment normal.    Results for orders placed or performed in visit on 03/27/23  Pulmonary Function Test [facility] Only  Result Value Ref Range   FVC-Pre  2.61 L   FVC-%Pred-Pre 83 %   FVC-Post 2.80 L   FVC-%Pred-Post 89 %   FVC-%Change-Post 7 %   FEV1-Pre 2.14 L   FEV1-%Pred-Pre 90 %   FEV1-Post 2.27 L   FEV1-%Pred-Post 95 %   FEV1-%Change-Post 5 %   FEV6-Pre 2.61 L   FEV6-%Pred-Pre 87 %   FEV6-Post 2.80 L   FEV6-%Pred-Post 93 %   FEV6-%Change-Post 7 %   Pre FEV1/FVC ratio 82 %   FEV1FVC-%Pred-Pre 108 %   Post FEV1/FVC ratio 81 %   FEV1FVC-%Change-Post -1 %   Pre FEV6/FVC Ratio 100 %   FEV6FVC-%Pred-Pre 104 %   Post FEV6/FVC ratio 100 %   FEV6FVC-%Pred-Post 104 %   FEF 25-75 Pre 2.32 L/sec   FEF2575-%Pred-Pre 123 %   FEF 25-75 Post 2.99 L/sec   FEF2575-%Pred-Post 159 %   FEF2575-%Change-Post 29 %   RV 1.94 L   RV % pred 82 %   TLC 4.84 L   TLC % pred 90 %   DLCO unc 12.84 ml/min/mmHg   DLCO unc % pred 62 %   DL/VA 0.45 ml/min/mmHg/L   DL/VA % pred 88 %      Assessment & Plan:   Problem List Items Addressed This Visit       Cardiovascular and Mediastinum   Aortic atherosclerosis (HCC)    Chronic.  Noted on past imaging, recommend she take a Baby ASA daily and continue statin therapy.  Check lipid panel today.      Relevant Orders   Comprehensive metabolic panel   Lipid Panel w/o Chol/HDL  Ratio   Essential hypertension    Chronic, stable.  BP at goal today.  Recommend she monitor BP at least a few mornings a week at home and document.  DASH diet at home.  Continue current medication regimen and adjust as needed.  Labs today: CBC and CMP today.         Stable angina    Chronic and stable with no recent CP or NTG use.  Continue collaboration with cardiology.  Recent notes and testing reviewed.        Respiratory   COPD (chronic obstructive pulmonary disease) (HCC) - Primary    Chronic, ongoing.  Continues to have SOB, but all pulmonary testing has been reassuring.  Continue this collaboration, recent notes reviewed.  Will continue current inhaler regimen as ordered.      Relevant Orders   CBC with  Differential/Platelet   OSA (obstructive sleep apnea)    Chronic, ongoing.  Not using CPAP, at this time, but is scheduled to have repeat sleep testing upcoming and is agreeable to start CPAP if required.        Endocrine   Hypothyroidism    Chronic, ongoing.  Continue current Levothyroxine dose and adjust as needed.  Labs up to date.        Other   Bilateral leg pain    Reports pain with walking, but none with being on stationary bike or swimming + none at rest.  She is concerned for PAD, low suspicion for this but discussed at length with her, will place referral to vascular per her request for further work-up and reassurance.  Suspect possible PVD or discomfort related to lower back ?arthritis related to previous back surgeries.  Determine next steps after visit with vascular.  May benefit lower back imaging.      Relevant Orders   Ambulatory referral to Vascular Surgery   Depression    Chronic, stable.  Denies SI/HI.  Continue current medication regimen and adjust as needed.        Hyperlipidemia    Chronic, ongoing.  Continue current medication regimen and adjust as needed.  Lipid panel today.      Relevant Orders   Comprehensive metabolic panel   Lipid Panel w/o Chol/HDL Ratio   Morbid obesity (HCC)    BMI 45.93.  Recommend to continue this.  Recommended eating smaller high protein, low fat meals more frequently and exercising 30 mins a day 5 times a week with a goal of 10-15lb weight loss in the next 3 months. Patient voiced their understanding and motivation to adhere to these recommendations.       Shortness of breath    Ongoing issue with recent reassuring pulmonary and cardiology work-up.  Continue these collaborations.  Would benefit modest weight loss, which may assist with breathing.  She is concerned for PAD, low suspicion for this but discussed at length with her, will place referral to vascular per her request.      Relevant Orders   Ambulatory referral to  Vascular Surgery     Follow up plan: Return in about 6 months (around 10/04/2023) for HTN/HLD, MOOD, SOB.

## 2023-04-03 NOTE — Assessment & Plan Note (Signed)
Chronic, ongoing.  Continue current medication regimen and adjust as needed. Lipid panel today. 

## 2023-04-03 NOTE — Assessment & Plan Note (Signed)
Chronic, ongoing.  Continues to have SOB, but all pulmonary testing has been reassuring.  Continue this collaboration, recent notes reviewed.  Will continue current inhaler regimen as ordered.

## 2023-04-03 NOTE — Assessment & Plan Note (Signed)
Chronic, ongoing.  Not using CPAP, at this time, but is scheduled to have repeat sleep testing upcoming and is agreeable to start CPAP if required.

## 2023-04-03 NOTE — Assessment & Plan Note (Signed)
Chronic, ongoing.  Continue current Levothyroxine dose and adjust as needed.  Labs up to date.

## 2023-04-03 NOTE — Assessment & Plan Note (Signed)
Reports pain with walking, but none with being on stationary bike or swimming + none at rest.  She is concerned for PAD, low suspicion for this but discussed at length with her, will place referral to vascular per her request for further work-up and reassurance.  Suspect possible PVD or discomfort related to lower back ?arthritis related to previous back surgeries.  Determine next steps after visit with vascular.  May benefit lower back imaging.

## 2023-04-03 NOTE — Assessment & Plan Note (Signed)
Chronic.  Noted on past imaging, recommend she take a Baby ASA daily and continue statin therapy.  Check lipid panel today.

## 2023-04-03 NOTE — Assessment & Plan Note (Signed)
Ongoing issue with recent reassuring pulmonary and cardiology work-up.  Continue these collaborations.  Would benefit modest weight loss, which may assist with breathing.  She is concerned for PAD, low suspicion for this but discussed at length with her, will place referral to vascular per her request.

## 2023-04-03 NOTE — Assessment & Plan Note (Signed)
Chronic and stable with no recent CP or NTG use.  Continue collaboration with cardiology.  Recent notes and testing reviewed.

## 2023-04-04 ENCOUNTER — Ambulatory Visit: Payer: Medicare PPO | Admitting: Nurse Practitioner

## 2023-04-04 ENCOUNTER — Other Ambulatory Visit: Payer: Self-pay | Admitting: Nurse Practitioner

## 2023-04-04 ENCOUNTER — Encounter: Payer: Self-pay | Admitting: Nurse Practitioner

## 2023-04-04 DIAGNOSIS — E871 Hypo-osmolality and hyponatremia: Secondary | ICD-10-CM | POA: Insufficient documentation

## 2023-04-04 LAB — LIPID PANEL W/O CHOL/HDL RATIO
Cholesterol, Total: 146 mg/dL (ref 100–199)
HDL: 68 mg/dL (ref >39)
LDL Chol Calc (NIH): 64 mg/dL (ref 0–99)
Triglycerides: 71 mg/dL (ref 0–149)
VLDL Cholesterol Cal: 14 mg/dL (ref 5–40)

## 2023-04-04 LAB — COMPREHENSIVE METABOLIC PANEL
ALT: 13 IU/L (ref 0–32)
AST: 14 IU/L (ref 0–40)
Albumin: 4.3 g/dL (ref 3.8–4.8)
Alkaline Phosphatase: 80 IU/L (ref 44–121)
BUN/Creatinine Ratio: 24 (ref 12–28)
BUN: 21 mg/dL (ref 8–27)
Bilirubin Total: 1.3 mg/dL — ABNORMAL HIGH (ref 0.0–1.2)
CO2: 24 mmol/L (ref 20–29)
Calcium: 9.3 mg/dL (ref 8.7–10.3)
Chloride: 92 mmol/L — ABNORMAL LOW (ref 96–106)
Creatinine, Ser: 0.88 mg/dL (ref 0.57–1.00)
Globulin, Total: 2.4 g/dL (ref 1.5–4.5)
Glucose: 102 mg/dL — ABNORMAL HIGH (ref 70–99)
Potassium: 4.5 mmol/L (ref 3.5–5.2)
Sodium: 130 mmol/L — ABNORMAL LOW (ref 134–144)
Total Protein: 6.7 g/dL (ref 6.0–8.5)
eGFR: 69 mL/min/{1.73_m2} (ref >59)

## 2023-04-04 LAB — CBC WITH DIFFERENTIAL/PLATELET
Basophils Absolute: 0.1 10*3/uL (ref 0.0–0.2)
Basos: 1 %
EOS (ABSOLUTE): 0.1 10*3/uL (ref 0.0–0.4)
Eos: 2 %
Hematocrit: 38.3 % (ref 34.0–46.6)
Hemoglobin: 13 g/dL (ref 11.1–15.9)
Immature Grans (Abs): 0 10*3/uL (ref 0.0–0.1)
Immature Granulocytes: 1 %
Lymphocytes Absolute: 1.8 10*3/uL (ref 0.7–3.1)
Lymphs: 25 %
MCH: 29.1 pg (ref 26.6–33.0)
MCHC: 33.9 g/dL (ref 31.5–35.7)
MCV: 86 fL (ref 79–97)
Monocytes Absolute: 0.6 10*3/uL (ref 0.1–0.9)
Monocytes: 8 %
Neutrophils Absolute: 4.7 10*3/uL (ref 1.4–7.0)
Neutrophils: 63 %
Platelets: 217 10*3/uL (ref 150–450)
RBC: 4.46 x10E6/uL (ref 3.77–5.28)
RDW: 13.6 % (ref 11.7–15.4)
WBC: 7.3 10*3/uL (ref 3.4–10.8)

## 2023-04-04 NOTE — Progress Notes (Signed)
Contacted via MyChart -- needs lab only visit in 2 weeks please   Good morning Tiffany Delacruz, your labs have returned: - Kidney function, creatinine and eGFR, remains normal, as is liver function, AST and ALT.  - Sodium, salt, level is still a bit low.  I would recommend adding some table salt to diet daily and monitoring BP with this.  It could be related to Prozac which can lower salt levels.  We may need to change this to an alternate medication in future.  This level has been low for 3 weeks.  I would like you to return to office in 2 weeks to recheck this as it can make you feel bad when level is low.   - Remainder of labs are stable, no changes needed with those.  Any questions? Keep being amazing!!  Thank you for allowing me to participate in your care.  I appreciate you. Kindest regards, Mel Tadros

## 2023-04-06 ENCOUNTER — Ambulatory Visit: Payer: Medicare PPO | Attending: Internal Medicine | Admitting: Nurse Practitioner

## 2023-04-06 ENCOUNTER — Encounter: Payer: Self-pay | Admitting: Nurse Practitioner

## 2023-04-06 VITALS — BP 130/78 | HR 88 | Ht 66.0 in | Wt 251.8 lb

## 2023-04-06 DIAGNOSIS — G4733 Obstructive sleep apnea (adult) (pediatric): Secondary | ICD-10-CM

## 2023-04-06 DIAGNOSIS — I10 Essential (primary) hypertension: Secondary | ICD-10-CM | POA: Diagnosis not present

## 2023-04-06 DIAGNOSIS — E782 Mixed hyperlipidemia: Secondary | ICD-10-CM | POA: Diagnosis not present

## 2023-04-06 DIAGNOSIS — I739 Peripheral vascular disease, unspecified: Secondary | ICD-10-CM | POA: Diagnosis not present

## 2023-04-06 DIAGNOSIS — R072 Precordial pain: Secondary | ICD-10-CM

## 2023-04-06 DIAGNOSIS — R0609 Other forms of dyspnea: Secondary | ICD-10-CM | POA: Diagnosis not present

## 2023-04-06 NOTE — Progress Notes (Signed)
Attempted to reach patient, LVM for her to call office back to schedule her 2 weeks lab only visit.  Put in CRM.

## 2023-04-06 NOTE — Patient Instructions (Signed)
Medication Instructions:  No changes *If you need a refill on your cardiac medications before your next appointment, please call your pharmacy*   Lab Work: None ordered If you have labs (blood work) drawn today and your tests are completely normal, you will receive your results only by: MyChart Message (if you have MyChart) OR A paper copy in the mail If you have any lab test that is abnormal or we need to change your treatment, we will call you to review the results.   Testing/Procedures: Your physician has requested that you have an ankle brachial index (ABI). During this test an ultrasound and blood pressure cuff are used to evaluate the arteries that supply the arms and legs with blood.  Allow thirty minutes for this exam.  There are no restrictions or special instructions.  This will take place at [address] [address], [place] [address]    Follow-Up: At [facility], you and your health needs are our priority.  As part of our continuing mission to provide you with exceptional heart care, we have created designated Provider Care Teams.  These Care Teams include your primary Cardiologist (physician) and Advanced Practice Providers (APPs -  Physician Assistants and Nurse Practitioners) who all work together to provide you with the care you need, when you need it.  We recommend signing up for the patient portal called "MyChart".  Sign up information is provided on this After Visit Summary.  MyChart is used to connect with patients for Virtual Visits (Telemedicine).  Patients are able to view lab/test results, encounter notes, upcoming appointments, etc.  Non-urgent messages can be sent to your provider as well.   To learn more about what you can do with MyChart, go to [email or web address].    Your next appointment:   Follow up as needed pending results

## 2023-04-06 NOTE — Progress Notes (Signed)
Office Visit    Patient Name: Tiffany Delacruz Date of Encounter: 04/06/2023  Primary Care Provider:  Marjie Skiff, NP Primary Cardiologist:  Tiffany Kendall, MD  Chief Complaint    72 y.o. y/o female w/ a h/o HTN, HL, Graves' disease with subsequent hypothyroidism, obstructive sleep apnea, CPAP intolerance, GERD, anxiety, and depression, who presents for follow-up related to chest pain and shortness of breath with recent normal coronary arteries and right heart filling pressures on diagnostic catheterization.  Past Medical History    Past Medical History:  Diagnosis Date   Anxiety    Depression    Diastolic dysfunction    a. 11/2022 Echo: EF 60-65%, no rmwa, GrI DD, nl RV fxn, mildly dil LA, mild MR, asc Ao 37mm.   GERD (gastroesophageal reflux disease)    Graves disease    Hyperlipidemia    Hypertension    Hypothyroidism    Obesity    Osteoarthritis    Precordial pain    a. 09/2022 MV: EF 71%, no isch/infarct; b. 02/2023 Cath: nl cors. Nl R heart filling pressures. CO/CI 7.0/3.2.   Sleep apnea    a. Doesn't tolerate CPAP.   Wheezing 07/14/2018   Past Surgical History:  Procedure Laterality Date   BACK SURGERY  1988   BREAST BIOPSY Right 2017   benign, with marker   CARPAL TUNNEL RELEASE Left 07/20/2017   Procedure: CARPAL TUNNEL RELEASE;  Surgeon: Donato Heinz, MD;  Location: [facility];  Service: Orthopedics;  Laterality: Left;   CARPAL TUNNEL RELEASE Right 06/05/2022   Procedure: CARPAL TUNNEL RELEASE;  Surgeon: Donato Heinz, MD;  Location: [facility];  Service: Orthopedics;  Laterality: Right;   cataracts surgery     COLONOSCOPY     disk repair     EYE SURGERY Bilateral 10/10/2018   JOINT REPLACEMENT Right 05/2013   knee replacement   RIGHT/LEFT HEART CATH AND CORONARY ANGIOGRAPHY Bilateral 03/14/2023   Procedure: RIGHT/LEFT HEART CATH AND CORONARY ANGIOGRAPHY;  Surgeon: Tiffany Kendall, MD;  Location: [facility];  Service: Cardiovascular;   Laterality: Bilateral;   TRIGGER FINGER RELEASE Left 2023    Allergies  No Known Allergies  History of Present Illness      72 y.o. y/o female w/ a h/o HTN, HL, Graves' disease with subsequent hypothyroidism, obstructive sleep apnea, CPAP intolerance, GERD, anxiety, and depression.  Tiffany Delacruz was first seen by Dr. Okey Delacruz in December 2020, at that time she was noting dyspnea on exertion and chest discomfort.  In January 2021 she had cardiac CTA that was normal with no evidence of coronary artery disease.  She then had an echocardiogram that showed normal LVEF and diastolic parameters with borderline elevated PA pressure.  In January 2024 she was referred back to Dr. Okey Delacruz for similar symptoms of chest discomfort and dyspnea on exertion that were worse compared to symptoms in 2020/2021.  Her nuclear stress test in January showed no evidence of ischemia or scar, poor exercise tolerance was noted. Her repeat echo in March showed normal LVEF with grade 1 diastolic dysfunction and mild MR, PA pressures were unable to be assessed.   She was referred to pulmonology and noted only slight improvement in dyspnea on Breztri.  At follow-up appointment on March 07, 2023, she continued to experience intermittent substernal chest pressure and dyspnea and was set up for diagnostic catheterization.  This showed normal coronary arteries and normal right heart filling pressures.   Since her diagnostic catheterization, she continues to  experience dyspnea on exertion when walking.  Interestingly, she does not experience significant dyspnea when she is doing water aerobics, swimming, or using a recumbent bicycle.  In discussing her dyspnea with walking, she notes that it is all starts because she has severe pain and tightness in her buttocks, hips, and upper posterior thighs.  Due to pain, she finds that walking is more challenging and she becomes tired more quickly.  She has never had ABIs and denies any lower extremity  ulcerations, discoloration, or change in hair distribution.  She does not experience chest pain and denies palpitations, PND, orthopnea, dizziness, syncope, edema, or early satiety.  Home Medications    Current Outpatient Medications  Medication Sig Dispense Refill   acetaminophen (TYLENOL) 650 MG CR tablet Take 1,300 mg by mouth every 8 (eight) hours as needed for pain.     albuterol (VENTOLIN HFA) 108 (90 Base) MCG/ACT inhaler TAKE 2 PUFFS BY MOUTH EVERY 6 HOURS AS NEEDED FOR WHEEZE OR SHORTNESS OF BREATH 17 each 0   b complex vitamins tablet Take 1 tablet by mouth daily.     chlorthalidone (HYGROTON) 25 MG tablet Take 0.5 tablets (12.5 mg total) by mouth daily. 45 tablet 2   famotidine (PEPCID) 20 MG tablet Take 20 mg by mouth as needed.     FLUoxetine (PROZAC) 40 MG capsule Take 40 mg by mouth daily.     fluticasone (FLONASE) 50 MCG/ACT nasal spray SPRAY 2 SPRAYS INTO EACH NOSTRIL EVERY DAY 48 mL 1   Fluticasone-Umeclidin-Vilant (TRELEGY ELLIPTA) 200-62.5-25 MCG/ACT AEPB Inhale 1 Act into the lungs daily. 1 each 5   levothyroxine (SYNTHROID) 150 MCG tablet TAKE 1 TABLET BY MOUTH EVERY DAY 90 tablet 0   losartan (COZAAR) 100 MG tablet Take 1 tablet (100 mg total) by mouth daily. 90 tablet 4   Melatonin 10 MG CHEW Chew by mouth at bedtime.     nitroGLYCERIN (NITROSTAT) 0.4 MG SL tablet Place 1 tablet (0.4 mg total) under the tongue every 5 (five) minutes as needed for chest pain (maximum of 3 doses.). 25 tablet 2   OVER THE COUNTER MEDICATION as needed. Allergy eye drops     rosuvastatin (CRESTOR) 10 MG tablet TAKE 1 TABLET BY MOUTH EVERY DAY 90 tablet 0   No current facility-administered medications for this visit.     Review of Systems    Bilateral buttock, hip, and upper thigh claudication.  Ongoing dyspnea on exertion with walking.  She denies chest pain, palpitations, PND, orthopnea, dizziness, syncope, edema, or early satiety.  All other systems reviewed and are otherwise negative  except as noted above.    Physical Exam    VS:  BP 130/78 (BP Location: Right Arm, Patient Position: Sitting)   Pulse 88   Ht 5\' 6"  (1.676 m)   Wt 251 lb 12.8 oz (114.2 kg)   LMP 12/15/1991 (Approximate)   SpO2 99%   BMI 40.64 kg/m  , BMI Body mass index is 40.64 kg/m.     GEN: Well nourished, well developed, in no acute distress. HEENT: normal. Neck: Supple, no JVD, carotid bruits, or masses. Cardiac: RRR, no murmurs, rubs, or gallops. No clubbing, cyanosis, edema.  Radials 2+/PT 1+ and equal bilaterally.  Respiratory:  Respirations regular and unlabored, clear to auscultation bilaterally. GI: Soft, nontender, nondistended, BS + x 4. MS: no deformity or atrophy. Skin: warm and dry, no rash. Neuro:  Strength and sensation are intact. Psych: Normal affect.  Accessory Clinical Findings    ECG  personally reviewed by me today - EKG Interpretation Date/Time:  Friday April 06 2023 10:07:58 EDT Ventricular Rate:  68 PR Interval:  156 QRS Duration:  74 QT Interval:  394 QTC Calculation: 418 R Axis:   46  Text Interpretation: Normal sinus rhythm Normal ECG When compared with ECG of 30-May-2022 12:17, No significant change was found Confirmed by Nicolasa Ducking [phone]) on 04/06/2023 10:18:05 AM  - no acute changes.  Lab Results  Component Value Date   WBC 7.3 04/03/2023   HGB 13.0 04/03/2023   HCT 38.3 04/03/2023   MCV 86 04/03/2023   PLT 217 04/03/2023   Lab Results  Component Value Date   CREATININE 0.88 04/03/2023   BUN 21 04/03/2023   NA 130 (L) 04/03/2023   K 4.5 04/03/2023   CL 92 (L) 04/03/2023   CO2 24 04/03/2023   Lab Results  Component Value Date   ALT 13 04/03/2023   AST 14 04/03/2023   ALKPHOS 80 04/03/2023   BILITOT 1.3 (H) 04/03/2023   Lab Results  Component Value Date   CHOL 146 04/03/2023   HDL 68 04/03/2023   LDLCALC 64 04/03/2023   TRIG 71 04/03/2023   CHOLHDL 3.6 05/13/2018    Lab Results  Component Value Date   HGBA1C 5.2 10/04/2022     Assessment & Plan    1.  Precordial chest pain/dyspnea on exertion: Long history of chest pain and dyspnea with normal coronary CTA in 2021 and more recently, normal Myoview January 2024, normal LV function by echo in March 2024, normal coronary arteries and right heart filling pressures on diagnostic catheterization in June 2024.  She denies chest pain today but continues to experience dyspnea on exertion specifically when walking (tolerates swimming, aqua aerobics, and recumbent bicycle).  Symptoms seem to be exacerbated by claudication of the buttocks, hips, and upper thighs.  Arranging for ABIs.  Otherwise, no plan for further ischemic evaluation.  2.  Claudication: As above, patient with bilateral buttocks, hips, and upper posterior thigh discomfort when walking, which leads to more difficulty with exertion and dyspnea.  Plan for ABIs.  3.  Primary hypertension: Blood pressure stable at 130/78 today.  Continue losartan therapy.  4.  Hyperlipidemia: LDL of 64 earlier this month with normal AST/ALT.  Continue statin therapy.  5.  Obstructive sleep apnea: Does not tolerate CPAP and uses oxygen at 1 L/min via nasal cannula at bedtime.  6.  Disposition: Follow-up ABIs.  If normal, follow-up as needed.  Informed Consent    Nicolasa Ducking, NP 04/06/2023, 12:30 PM

## 2023-04-15 DIAGNOSIS — J449 Chronic obstructive pulmonary disease, unspecified: Secondary | ICD-10-CM | POA: Diagnosis not present

## 2023-04-17 ENCOUNTER — Ambulatory Visit: Payer: Medicare PPO | Attending: Otolaryngology

## 2023-04-17 DIAGNOSIS — G4761 Periodic limb movement disorder: Secondary | ICD-10-CM | POA: Insufficient documentation

## 2023-04-17 DIAGNOSIS — G4733 Obstructive sleep apnea (adult) (pediatric): Secondary | ICD-10-CM | POA: Diagnosis not present

## 2023-04-18 ENCOUNTER — Ambulatory Visit: Payer: Medicare PPO | Admitting: Internal Medicine

## 2023-04-26 NOTE — Patient Instructions (Signed)
Hyponatremia Hyponatremia is when the amount of salt (sodium) in your blood is too low. When salt levels are low, your body cells may take in extra water. This can cause swelling. The swelling often affects the brain. What are the causes? Certain medical problems or conditions. Vomiting a lot. Having watery poop (diarrhea) often. Sweating too much. Taking certain medicines or using illegal drugs. Fluids given through an IV tube. What increases the risk? Having heart, kidney, or liver failure. Having a medical condition that causes you to have watery poop a lot. Doing very hard exercises. Taking medicines that affect the amount of salt that is in your blood. What are the signs or symptoms? Symptoms of this condition include: Headache. Feeling like you may vomit (nausea). Vomiting. Being very tired. Muscle weakness and cramps. Not wanting to eat as much as normal. Feeling weak or dizzy. Very bad symptoms of this condition include: Confusion. Feeling restless. Having a fast heart rate. Fainting. Seizures. Coma. How is this treated? Treatment for this condition depends on the cause. Treatment may include: Getting fluids through an IV tube that is put into one of your veins. Taking medicines to fix the salt levels in your blood. If medicines are causing the problem, your medicines will need to be changed. Limiting how much water or fluid you take in, in some cases. Monitoring in the hospital to watch your symptoms. Follow these instructions at home:  Take over-the-counter and prescription medicines only as told by your doctor. Many medicines can make this condition worse. Talk with your doctor about any medicines that you are taking. Do not drink alcohol. Keep all follow-up visits. Contact a doctor if: You feel more like you may vomit. You feel more tired. Your headache gets worse. You feel more confused. You feel weaker. Your symptoms go away and then they come back. Get  help right away if: You have a seizure. You faint. You keep having watery poop. You keep vomiting. Summary Hyponatremia is when the amount of salt in your blood is too low. When salt levels are low, you can have swelling throughout the body. The swelling mostly affects the brain. Treatment depends on the cause. Treatment may include IV fluids and changing medicines. This information is not intended to replace advice given to you by your health care provider. Make sure you discuss any questions you have with your health care provider. Document Revised: 03/22/2021 Document Reviewed: 03/22/2021 Elsevier Patient Education  2024 [employer].

## 2023-05-01 ENCOUNTER — Encounter: Payer: Self-pay | Admitting: Nurse Practitioner

## 2023-05-01 ENCOUNTER — Ambulatory Visit: Payer: Medicare PPO | Admitting: Nurse Practitioner

## 2023-05-01 VITALS — BP 109/65 | HR 79 | Temp 97.9°F | Wt 252.0 lb

## 2023-05-01 DIAGNOSIS — E871 Hypo-osmolality and hyponatremia: Secondary | ICD-10-CM

## 2023-05-01 DIAGNOSIS — I10 Essential (primary) hypertension: Secondary | ICD-10-CM

## 2023-05-01 DIAGNOSIS — M5442 Lumbago with sciatica, left side: Secondary | ICD-10-CM | POA: Diagnosis not present

## 2023-05-01 DIAGNOSIS — G8929 Other chronic pain: Secondary | ICD-10-CM | POA: Diagnosis not present

## 2023-05-01 DIAGNOSIS — F3342 Major depressive disorder, recurrent, in full remission: Secondary | ICD-10-CM

## 2023-05-01 DIAGNOSIS — J449 Chronic obstructive pulmonary disease, unspecified: Secondary | ICD-10-CM

## 2023-05-01 MED ORDER — HYDROCODONE-ACETAMINOPHEN 5-325 MG PO TABS: 1.0000 | ORAL_TABLET | Freq: Four times a day (QID) | ORAL | 0 refills | Status: DC | PRN

## 2023-05-01 NOTE — Assessment & Plan Note (Signed)
Recheck labs today and adjust regimen as needed.

## 2023-05-01 NOTE — Assessment & Plan Note (Signed)
Chronic, stable.  BP at goal today.  Recommend she monitor BP at least a few mornings a week at home and document.  DASH diet at home.  Continue current medication regimen and adjust as needed.  Labs today: BMP.

## 2023-05-01 NOTE — Assessment & Plan Note (Signed)
Chronic, ongoing. Discussed use of opioid therapy with her. She has used appropriately with 20 pills lasting long while.  Will send in refill today and monitor closely.  If increased use will need pain management referral.  If ongoing need will obtain UDS and contract.

## 2023-05-01 NOTE — Progress Notes (Signed)
BP 109/65   Pulse 79   Temp 97.9 F (36.6 C) (Oral)   Wt 252 lb (114.3 kg)   LMP 12/15/1991 (Approximate)   SpO2 98%   BMI 40.67 kg/m    Subjective:    Patient ID: Tiffany Delacruz, female    DOB: [birth year], 71 y.o.   MRN: [id number]  HPI: Tiffany Delacruz is a 71 y.o. female  Chief Complaint  Patient presents with   Hypertension   HYPERTENSION without Chronic Kidney Disease Continues on Chlorthalidone and Losartan.  Has ongoing SOB at baseline.  Followed by cardiology with last visit 04/06/23 and pulmonary seen 03/23/23.  Had sleep study on 04/17/23, no results as of yet.  Last labs noted ongoing low NA+.  On the 23rd has PAD testing.    Is having pain down buttocks and legs (has chronic back issues at baseline), was taking her husband's Norco that as leftover as needed.  Has run out of these and would like refills.  She rarely uses, it was filled 2015 last.  Her last Norco fill was 06/05/22 for 8 tablets. Hypertension status: stable  Satisfied with current treatment? yes Duration of hypertension: chronic BP monitoring frequency:  not checking BP range:  BP medication side effects:  no Medication compliance: good compliance Aspirin: no Recurrent headaches: no Visual changes: no Palpitations: no Dyspnea: no Chest pain: no Lower extremity edema: no Dizzy/lightheaded: no   Relevant past medical, surgical, family and social history reviewed and updated as indicated. Interim medical history since our last visit reviewed. Allergies and medications reviewed and updated.  Review of Systems  Constitutional:  Negative for activity change, appetite change, diaphoresis, fatigue and fever.  Respiratory:  Positive for shortness of breath. Negative for cough, chest tightness and wheezing.   Cardiovascular:  Negative for chest pain, palpitations and leg swelling.  Gastrointestinal: Negative.   Endocrine: Negative for cold intolerance and heat intolerance.  Genitourinary: Negative.    Musculoskeletal:  Positive for arthralgias.  Neurological: Negative.   Psychiatric/Behavioral: Negative.      Per HPI unless specifically indicated above     Objective:    BP 109/65   Pulse 79   Temp 97.9 F (36.6 C) (Oral)   Wt 252 lb (114.3 kg)   LMP 12/15/1991 (Approximate)   SpO2 98%   BMI 40.67 kg/m   Wt Readings from Last 3 Encounters:  05/01/23 252 lb (114.3 kg)  04/06/23 251 lb 12.8 oz (114.2 kg)  04/03/23 251 lb 12.8 oz (114.2 kg)    Physical Exam Vitals and nursing note reviewed.  Constitutional:      General: She is awake. She is not in acute distress.    Appearance: She is well-developed and well-groomed. She is obese. She is not ill-appearing.  HENT:     Head: Normocephalic.     Right Ear: Hearing normal. No drainage.     Left Ear: Hearing normal. No drainage.  Eyes:     General: Lids are normal.        Right eye: No discharge.        Left eye: No discharge.     Conjunctiva/sclera: Conjunctivae normal.     Pupils: Pupils are equal, round, and reactive to light.  Neck:     Thyroid: No thyromegaly.     Vascular: No carotid bruit.  Cardiovascular:     Rate and Rhythm: Normal rate and regular rhythm.     Heart sounds: Normal heart sounds. No murmur heard.    No gallop.  Pulmonary:     Effort: Pulmonary effort is normal. No accessory muscle usage or respiratory distress.     Breath sounds: Normal breath sounds.  Abdominal:     General: Bowel sounds are normal. There is no distension.     Palpations: Abdomen is soft.     Tenderness: There is no abdominal tenderness. There is no right CVA tenderness or left CVA tenderness.  Musculoskeletal:     Cervical back: Normal range of motion and neck supple.     Right lower leg: No edema.     Left lower leg: No edema.  Lymphadenopathy:     Head:     Right side of head: No submental, submandibular, tonsillar, preauricular or posterior auricular adenopathy.     Left side of head: No submental, submandibular,  tonsillar, preauricular or posterior auricular adenopathy.     Cervical: No cervical adenopathy.  Skin:    General: Skin is warm and dry.  Neurological:     Mental Status: She is alert and oriented to person, place, and time.     Gait: Gait is intact.     Deep Tendon Reflexes: Reflexes are normal and symmetric.     Reflex Scores:      Brachioradialis reflexes are 2+ on the right side and 2+ on the left side.      Patellar reflexes are 2+ on the right side and 2+ on the left side. Psychiatric:        Attention and Perception: Attention normal.        Mood and Affect: Mood normal.        Speech: Speech normal.        Behavior: Behavior normal. Behavior is cooperative.        Thought Content: Thought content normal.        Judgment: Judgment normal.     Results for orders placed or performed in visit on 04/03/23  Comprehensive metabolic panel  Result Value Ref Range   Glucose 102 (H) 70 - 99 mg/dL   BUN 21 8 - 27 mg/dL   Creatinine, Ser 4.09 0.57 - 1.00 mg/dL   eGFR 69 >81 XB/JYN/8.29   BUN/Creatinine Ratio 24 12 - 28   Sodium 130 (L) 134 - 144 mmol/L   Potassium 4.5 3.5 - 5.2 mmol/L   Chloride 92 (L) 96 - 106 mmol/L   CO2 24 20 - 29 mmol/L   Calcium 9.3 8.7 - 10.3 mg/dL   Total Protein 6.7 6.0 - 8.5 g/dL   Albumin 4.3 3.8 - 4.8 g/dL   Globulin, Total 2.4 1.5 - 4.5 g/dL   Bilirubin Total 1.3 (H) 0.0 - 1.2 mg/dL   Alkaline Phosphatase 80 44 - 121 IU/L   AST 14 0 - 40 IU/L   ALT 13 0 - 32 IU/L  CBC with Differential/Platelet  Result Value Ref Range   WBC 7.3 3.4 - 10.8 x10E3/uL   RBC 4.46 3.77 - 5.28 x10E6/uL   Hemoglobin 13.0 11.1 - 15.9 g/dL   Hematocrit 56.2 13.0 - 46.6 %   MCV 86 79 - 97 fL   MCH 29.1 26.6 - 33.0 pg   MCHC 33.9 31.5 - 35.7 g/dL   RDW 86.5 78.4 - 69.6 %   Platelets 217 150 - 450 x10E3/uL   Neutrophils 63 Not Estab. %   Lymphs 25 Not Estab. %   Monocytes 8 Not Estab. %   Eos 2 Not Estab. %   Basos 1 Not Estab. %   Neutrophils  Absolute 4.7 1.4 -  7.0 x10E3/uL   Lymphocytes Absolute 1.8 0.7 - 3.1 x10E3/uL   Monocytes Absolute 0.6 0.1 - 0.9 x10E3/uL   EOS (ABSOLUTE) 0.1 0.0 - 0.4 x10E3/uL   Basophils Absolute 0.1 0.0 - 0.2 x10E3/uL   Immature Granulocytes 1 Not Estab. %   Immature Grans (Abs) 0.0 0.0 - 0.1 x10E3/uL  Lipid Panel w/o Chol/HDL Ratio  Result Value Ref Range   Cholesterol, Total 146 100 - 199 mg/dL   Triglycerides 71 0 - 149 mg/dL   HDL 68 >91 mg/dL   VLDL Cholesterol Cal 14 5 - 40 mg/dL   LDL Chol Calc (NIH) 64 0 - 99 mg/dL      Assessment & Plan:   Problem List Items Addressed This Visit       Cardiovascular and Mediastinum   Essential hypertension - Primary    Chronic, stable.  BP at goal today.  Recommend she monitor BP at least a few mornings a week at home and document.  DASH diet at home.  Continue current medication regimen and adjust as needed.  Labs today: BMP.         Other   Chronic lower back pain    Chronic, ongoing. Discussed use of opioid therapy with her. She has used appropriately with 20 pills lasting long while.  Will send in refill today and monitor closely.  If increased use will need pain management referral.  If ongoing need will obtain UDS and contract.      Relevant Medications   HYDROcodone-acetaminophen (NORCO) 5-325 MG tablet   Hyponatremia    Recheck labs today and adjust regimen as needed.        Follow up plan: Return for as scheduled in January.

## 2023-05-02 NOTE — Progress Notes (Signed)
Contacted via MyChart -- please make sure she received below message and needs lab only visit in one week please:) Good morning Tiffany Delacruz, your labs have returned and unfortunately your sodium remains low which can make you feel bad.  It has been on low side since 03/08/23.  I suspect this is two-fold issue and could be related to Prozac or Chlorthalidone.  However, you have been on Prozac for some time without issue.  I recommend holding the Chlorthalidone for one week, not taking, and then we will recheck levels.  If improved then we may have to stop the medication and try something else for BP control.  Any questions? Keep being amazing!!  Thank you for allowing me to participate in your care.  I appreciate you. Kindest regards, Malley Hauter

## 2023-05-03 NOTE — Progress Notes (Signed)
Pt has been scheduled on 05/09/2023 @ 8:20 AM for labs only visit.

## 2023-05-04 ENCOUNTER — Telehealth: Payer: Self-pay | Admitting: Nurse Practitioner

## 2023-05-04 ENCOUNTER — Telehealth (HOSPITAL_BASED_OUTPATIENT_CLINIC_OR_DEPARTMENT_OTHER): Payer: Medicare PPO | Admitting: Pulmonary Disease

## 2023-05-04 DIAGNOSIS — G4733 Obstructive sleep apnea (adult) (pediatric): Secondary | ICD-10-CM

## 2023-05-04 NOTE — Telephone Encounter (Signed)
Split study >> AHI 60/h  Central apneas on CPAP 9-10 cm BiPAP 12/7 with small wide mask was adequate

## 2023-05-04 NOTE — Telephone Encounter (Signed)
Will await Katie's response.  

## 2023-05-04 NOTE — Telephone Encounter (Unsigned)
Copied from CRM [id number]. Topic: General - Other >> May 04, 2023  4:08 PM Everette C wrote: Reason for CRM: The patient has been directed by their preferred pharmacy to contact their PCP and request additional contact regarding a possible alternative to their HYDROcodone-acetaminophen (NORCO) 5-325 MG tablet [[id number]] prescription   Please contact further when possible

## 2023-05-07 MED ORDER — HYDROCODONE-ACETAMINOPHEN 5-325 MG PO TABS: 1.0000 | ORAL_TABLET | Freq: Four times a day (QID) | ORAL | 0 refills | Status: AC | PRN

## 2023-05-07 NOTE — Addendum Note (Signed)
Addended by: Aura Dials T on: 05/07/2023 04:31 PM   Modules accepted: Orders

## 2023-05-07 NOTE — Telephone Encounter (Signed)
Patient notified that medication has been sent to [facility].

## 2023-05-07 NOTE — Telephone Encounter (Signed)
Called and spoke with [facility]. Medication is out of stock and will be for a couple of weeks.   Called [address], [phone], medication is in stock.   Called patient and confirmed that she is ok with having the medication sent to [facility]. RX t'd up for provider.

## 2023-05-09 ENCOUNTER — Other Ambulatory Visit: Payer: Medicare PPO

## 2023-05-09 ENCOUNTER — Encounter (INDEPENDENT_AMBULATORY_CARE_PROVIDER_SITE_OTHER): Payer: Self-pay

## 2023-05-09 ENCOUNTER — Encounter (INDEPENDENT_AMBULATORY_CARE_PROVIDER_SITE_OTHER): Payer: Self-pay | Admitting: Family Medicine

## 2023-05-09 DIAGNOSIS — E871 Hypo-osmolality and hyponatremia: Secondary | ICD-10-CM

## 2023-05-10 LAB — BASIC METABOLIC PANEL
BUN/Creatinine Ratio: 33 — ABNORMAL HIGH (ref 12–28)
BUN: 24 mg/dL (ref 8–27)
CO2: 25 mmol/L (ref 20–29)
Calcium: 8.9 mg/dL (ref 8.7–10.3)
Chloride: 96 mmol/L (ref 96–106)
Creatinine, Ser: 0.72 mg/dL (ref 0.57–1.00)
Glucose: 102 mg/dL — ABNORMAL HIGH (ref 70–99)
Potassium: 4.3 mmol/L (ref 3.5–5.2)
Sodium: 134 mmol/L (ref 134–144)
eGFR: 88 mL/min/{1.73_m2} (ref >59)

## 2023-05-10 NOTE — Progress Notes (Signed)
Contacted via MyChart

## 2023-05-14 NOTE — Telephone Encounter (Signed)
Katie, please advise. Thanks.

## 2023-05-14 NOTE — Addendum Note (Signed)
Addended by: Lajoyce Lauber A on: 05/14/2023 03:53 PM   Modules accepted: Orders

## 2023-05-14 NOTE — Telephone Encounter (Signed)
Pt is aware of results and voiced her understanding.  She agrees with bipap. Order has been placed.  She will keep scheduled visit. Nothing further needed.

## 2023-05-14 NOTE — Telephone Encounter (Signed)
Severe OSA, which is likely contributing to her daytime symptoms of fatigue/decreased stamina and dyspnea. She had breakthrough central events on CPAP so required BiPAP therapy. Please send orders for auto BiPAP IPAP max 14, EPAP min 6, PS 4 with small wide mask. She has an appt with Dr. Belia Heman on 8/29 so can discuss further then if she has any additional questions. Thanks!

## 2023-05-16 DIAGNOSIS — J449 Chronic obstructive pulmonary disease, unspecified: Secondary | ICD-10-CM | POA: Diagnosis not present

## 2023-05-18 ENCOUNTER — Ambulatory Visit: Payer: Medicare PPO | Attending: Cardiovascular Disease

## 2023-05-18 DIAGNOSIS — I739 Peripheral vascular disease, unspecified: Secondary | ICD-10-CM | POA: Diagnosis not present

## 2023-05-18 LAB — VAS US ABI WITH/WO TBI
Left ABI: 1.1
Right ABI: 1.18

## 2023-05-21 ENCOUNTER — Encounter: Payer: Self-pay | Admitting: Nurse Practitioner

## 2023-05-24 ENCOUNTER — Ambulatory Visit: Payer: Medicare PPO | Admitting: Internal Medicine

## 2023-05-24 ENCOUNTER — Encounter: Payer: Self-pay | Admitting: Internal Medicine

## 2023-05-24 ENCOUNTER — Other Ambulatory Visit: Payer: Self-pay | Admitting: Internal Medicine

## 2023-05-24 VITALS — BP 138/78 | HR 76 | Temp 99.1°F | Ht 66.0 in | Wt 250.0 lb

## 2023-05-24 DIAGNOSIS — J4530 Mild persistent asthma, uncomplicated: Secondary | ICD-10-CM

## 2023-05-24 DIAGNOSIS — G2581 Restless legs syndrome: Secondary | ICD-10-CM

## 2023-05-24 DIAGNOSIS — G4733 Obstructive sleep apnea (adult) (pediatric): Secondary | ICD-10-CM

## 2023-05-24 MED ORDER — ROPINIROLE HCL ER 6 MG PO TB24
1.0000 | ORAL_TABLET | Freq: Every evening | ORAL | 1 refills | Status: DC | PRN
Start: 2023-05-24 — End: 2023-07-18

## 2023-05-24 NOTE — Progress Notes (Signed)
[facility] Pulmonary Medicine Consultation      Date: 05/24/2023,   MRN# [id number] Tiffany Delacruz [birth year]     CHIEF COMPLAINT:   Follow up SOB Follow up assessment of OSA    HISTORY OF PRESENT ILLNESS  72 y.o. female with history of hypertension, hyperlipidemia, Graves' disease with subsequent hypothyroidism, obstructive sleep apnea (not compliant with CPAP), GERD, and depression/anxiety with probable underlying COPD  04/2023 Split study >> AHI 60/h  Central apneas on CPAP 9-10 cm BiPAP 12/7 with small wide mask was adequate Patient has appointment September 4 for assessment  Pulmonary function test reviewed in detail with patient ATS criteria was not met however there is no obstructive or restrictive process Patient currently on 1 L nasal cannula at night  Recommend weight loss He has gained 50 pounds in the last 2 years Encouraged proper weight management.  Important to get eight or more hours of sleep  Limiting the use of the computer and television before bedtime.  Decrease naps during the day, so night time sleep will become enhanced.  Limit caffeine, and sleep deprivation.  HTN, stroke, uncontrolled diabetes and heart failure are potential risk factors.  Risk of untreated sleep apnea including cardiac arrhthymias, stroke, DM, pulm HTN.   Patient has a longstanding history of secondhand smoke exposure I explained to patient that she has likely diagnosis of COPD  Patient has shortness of breath and exertional dyspnea Has been having these problems for several years  No exacerbation at this time No evidence of heart failure at this time No evidence or signs of infection at this time No respiratory distress No fevers, chills, nausea, vomiting, diarrhea No evidence of lower extremity edema No evidence hemoptysis  Patient with a history of restless leg syndrome Will start Requip    CARDIAC HISTORY   --------------------------------------------------------------------------------------------------   Cardiovascular History  Non-Invasive Evaluation(s): TTE (11/12/2019): Normal LV size and wall thickness.  LVEF 55-60% with normal wall motion and diastolic function.  Normal RV size and function with mild pulmonary hypertension (per report; RVSP 31 mmHg -> ULN).  No significant valvular abnormalities. Cardiac CTA (10/09/2019): Normal right dominant coronary arteries without CAD.  Coronary calcium score = 0.  Aortic atherosclerosis was noted.      PAST MEDICAL HISTORY   Past Medical History:  Diagnosis Date   Anxiety    Depression    Diastolic dysfunction    a. 11/2022 Echo: EF 60-65%, no rmwa, GrI DD, nl RV fxn, mildly dil LA, mild MR, asc Ao 37mm.   GERD (gastroesophageal reflux disease)    Graves disease    Hyperlipidemia    Hypertension    Hypothyroidism    Obesity    Osteoarthritis    Precordial pain    a. 09/2022 MV: EF 71%, no isch/infarct; b. 02/2023 Cath: nl cors. Nl R heart filling pressures. CO/CI 7.0/3.2.   Sleep apnea    a. Doesn't tolerate CPAP.   Wheezing 07/14/2018     SURGICAL HISTORY   Past Surgical History:  Procedure Laterality Date   BACK SURGERY  1988   BREAST BIOPSY Right 2017   benign, with marker   CARPAL TUNNEL RELEASE Left 07/20/2017   Procedure: CARPAL TUNNEL RELEASE;  Surgeon: Donato Heinz, MD;  Location: [facility] ORS;  Service: Orthopedics;  Laterality: Left;   CARPAL TUNNEL RELEASE Right 06/05/2022   Procedure: CARPAL TUNNEL RELEASE;  Surgeon: Donato Heinz, MD;  Location: [facility] ORS;  Service: Orthopedics;  Laterality: Right;   cataracts surgery  COLONOSCOPY     disk repair     EYE SURGERY Bilateral 10/10/2018   JOINT REPLACEMENT Right 05/2013   knee replacement   RIGHT/LEFT HEART CATH AND CORONARY ANGIOGRAPHY Bilateral 03/14/2023   Procedure: RIGHT/LEFT HEART CATH AND CORONARY ANGIOGRAPHY;  Surgeon: Yvonne Kendall, MD;  Location: [facility]  INVASIVE CV LAB;  Service: Cardiovascular;  Laterality: Bilateral;   TRIGGER FINGER RELEASE Left 2023     FAMILY HISTORY   Family History  Problem Relation Age of Onset   Uterine cancer Mother    Colon cancer Mother    Heart disease Father    Alcohol abuse Father    Heart attack Father 41   Rheum arthritis Sister    Diabetes Maternal Grandmother    Rheum arthritis Brother      SOCIAL HISTORY   Social History   Tobacco Use   Smoking status: Never   Smokeless tobacco: Never  Vaping Use   Vaping status: Never Used  Substance Use Topics   Alcohol use: No    Alcohol/week: 0.0 standard drinks of alcohol   Drug use: No     MEDICATIONS    Home Medication:  Current Outpatient Rx   Order #: [id number] Class: Historical Med   Order #: [id number] Class: Normal   Order #: [id number] Class: Historical Med   Order #: [id number] Class: Normal   Order #: [id number] Class: Historical Med   Order #: [id number] Class: Historical Med   Order #: [id number] Class: Normal   Order #: [id number] Class: Normal   Order #: [id number] Class: Normal   Order #: [id number] Class: Normal   Order #: [id number] Class: Historical Med   Order #: [id number] Class: Normal   Order #: [id number] Class: Historical Med   Order #: [id number] Class: Normal    Current Medication:  Current Outpatient Medications:    acetaminophen (TYLENOL) 650 MG CR tablet, Take 1,300 mg by mouth every 8 (eight) hours as needed for pain., Disp: , Rfl:    albuterol (VENTOLIN HFA) 108 (90 Base) MCG/ACT inhaler, TAKE 2 PUFFS BY MOUTH EVERY 6 HOURS AS NEEDED FOR WHEEZE OR SHORTNESS OF BREATH, Disp: 17 each, Rfl: 0   b complex vitamins tablet, Take 1 tablet by mouth daily., Disp: , Rfl:    chlorthalidone (HYGROTON) 25 MG tablet, Take 0.5 tablets (12.5 mg total) by mouth daily., Disp: 45 tablet, Rfl: 2   famotidine (PEPCID) 20 MG tablet, Take 20 mg by mouth as needed., Disp: , Rfl:    FLUoxetine (PROZAC) 40 MG capsule, Take 40 mg by mouth daily.,  Disp: , Rfl:    fluticasone (FLONASE) 50 MCG/ACT nasal spray, SPRAY 2 SPRAYS INTO EACH NOSTRIL EVERY DAY, Disp: 48 mL, Rfl: 1   Fluticasone-Umeclidin-Vilant (TRELEGY ELLIPTA) 200-62.5-25 MCG/ACT AEPB, Inhale 1 Act into the lungs daily., Disp: 1 each, Rfl: 5   levothyroxine (SYNTHROID) 150 MCG tablet, TAKE 1 TABLET BY MOUTH EVERY DAY, Disp: 90 tablet, Rfl: 0   losartan (COZAAR) 100 MG tablet, Take 1 tablet (100 mg total) by mouth daily., Disp: 90 tablet, Rfl: 4   Melatonin 10 MG CHEW, Chew by mouth at bedtime., Disp: , Rfl:    nitroGLYCERIN (NITROSTAT) 0.4 MG SL tablet, Place 1 tablet (0.4 mg total) under the tongue every 5 (five) minutes as needed for chest pain (maximum of 3 doses.)., Disp: 25 tablet, Rfl: 2   OVER THE COUNTER MEDICATION, as needed. Allergy eye drops, Disp: , Rfl:    rosuvastatin (CRESTOR) 10 MG tablet, TAKE 1 TABLET BY MOUTH EVERY DAY, Disp: 90 tablet, Rfl: 0  ALLERGIES   Patient has no known allergies.   BP 138/78 (BP Location: Left Wrist, Cuff Size: Large)   Pulse 76   Temp 99.1 F (37.3 C) (Oral)   Ht 5\' 6"  (1.676 m)   Wt 250 lb (113.4 kg)   LMP 12/15/1991 (Approximate)   SpO2 97%   BMI 40.35 kg/m       Review of Systems: Gen:  Denies  fever, sweats, chills weight loss  HEENT: Denies blurred vision, double vision, ear pain, eye pain, hearing loss, nose bleeds, sore throat Cardiac:  No dizziness, chest pain or heaviness, chest tightness,edema, No JVD Resp:   No cough, -sputum production, +shortness of breath,-wheezing, -hemoptysis,  Other:  All other systems negative   Physical Examination:   General Appearance: No distress  EYES PERRLA, EOM intact.   NECK Supple, No JVD Pulmonary: normal breath sounds, No wheezing.  CardiovascularNormal S1,S2.  No m/r/g.   Abdomen: Benign, Soft, non-tender. Neurology UE/LE 5/5 strength, no focal deficits Ext pulses intact, cap refill intact ALL OTHER ROS ARE NEGATIVE    ASSESSMENT/PLAN   72 year old  pleasant white female seen today for longstanding secondhand smoke exposure with probable underlying COPD in the setting of deconditioned state respiratory insufficiency obesity with a diagnosis of OSA who is noncompliant  Assessment of severe OSA September 4 assessment for sleep apnea CPAP therapy Sleep study results reviewed in detail Severe OSA AHI of 60   Mild reactive airways disease Albuterol still helps Avoid secondhand smoke Avoid SICK contacts Recommend  Masking  when appropriate Recommend Keep up-to-date with vaccinations  Follow up Cardiology as scheduled  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 -Patient benefits from oxygen therapy  -recommend using oxygen as prescribed -patient needs this for survival  Obesity -recommend significant weight loss -recommend changing diet  Deconditioned state -Recommend increased daily activity and exercise  Restless leg syndrome Plan to start Requip low-dose  Recommend weight loss Patient current weight is 250 pounds up 50 pounds over the last 2 years   MEDICATION ADJUSTMENTS/LABS AND TESTS ORDERED: Continue oxygen as prescribed Continue albuterol as needed Avoid secondhand smoke Avoid SICK contacts Recommend  Masking  when appropriate Recommend Keep up-to-date with vaccinations Follow-up assessment CPAP therapy September 4 Start Requip   CURRENT MEDICATIONS REVIEWED AT LENGTH WITH PATIENT TODAY   Patient  satisfied with Plan of action and management. All questions answered  Follow up 6 months  Total Time Spent 35 mins   Wallis Bamberg Santiago Glad, M.D.  [facility]  Medical Director [facility] Medical Director [facility] Cardio-Pulmonary Department

## 2023-05-24 NOTE — Patient Instructions (Addendum)
Follow-up appointment for sleep apnea assessment September 4  Recommend weight loss  Avoid secondhand smoke Avoid SICK contacts Recommend  Masking  when appropriate Recommend Keep up-to-date with vaccinations  Albuterol as needed  Follow-up with cardiology as scheduled  Start Requip for restless leg syndrome

## 2023-05-30 DIAGNOSIS — G4733 Obstructive sleep apnea (adult) (pediatric): Secondary | ICD-10-CM | POA: Diagnosis not present

## 2023-06-16 DIAGNOSIS — J449 Chronic obstructive pulmonary disease, unspecified: Secondary | ICD-10-CM | POA: Diagnosis not present

## 2023-06-17 ENCOUNTER — Encounter: Payer: Self-pay | Admitting: Internal Medicine

## 2023-06-18 NOTE — Telephone Encounter (Signed)
Pharm team can you please check if ropinirole needs a PA.   Thank you,

## 2023-06-22 ENCOUNTER — Telehealth: Payer: Self-pay

## 2023-06-22 ENCOUNTER — Other Ambulatory Visit: Payer: Self-pay | Admitting: Nurse Practitioner

## 2023-06-22 ENCOUNTER — Other Ambulatory Visit (HOSPITAL_COMMUNITY): Payer: Self-pay

## 2023-06-22 NOTE — Telephone Encounter (Signed)
Prior Berkley Harvey is required. This will be started and a new encounter will be created

## 2023-06-22 NOTE — Telephone Encounter (Signed)
Requested Prescriptions  Pending Prescriptions Disp Refills   rosuvastatin (CRESTOR) 10 MG tablet [Pharmacy Med Name: ROSUVASTATIN CALCIUM 10 MG TAB] 90 tablet 2    Sig: TAKE 1 TABLET BY MOUTH EVERY DAY     Cardiovascular:  Antilipid - Statins 2 Failed - 06/22/2023  1:31 AM      Failed - Lipid Panel in normal range within the last 12 months    Cholesterol, Total  Date Value Ref Range Status  04/03/2023 146 100 - 199 mg/dL Final   Cholesterol Piccolo, Waived  Date Value Ref Range Status  04/16/2015 187 <200 mg/dL Final    Comment:                            Desirable                <200                         Borderline High      200- 239                         High                     >239    LDL Chol Calc (NIH)  Date Value Ref Range Status  04/03/2023 64 0 - 99 mg/dL Final   HDL  Date Value Ref Range Status  04/03/2023 68 >39 mg/dL Final   Triglycerides  Date Value Ref Range Status  04/03/2023 71 0 - 149 mg/dL Final   Triglycerides Piccolo,Waived  Date Value Ref Range Status  04/16/2015 95 <150 mg/dL Final    Comment:                            Normal                   <150                         Borderline High     150 - 199                         High                200 - 499                         Very High                >499          Passed - Cr in normal range and within 360 days    Creatinine  Date Value Ref Range Status  06/18/2013 0.96 0.60 - 1.30 mg/dL Final   Creatinine, Ser  Date Value Ref Range Status  05/09/2023 0.72 0.57 - 1.00 mg/dL Final         Passed - Patient is not pregnant      Passed - Valid encounter within last 12 months    Recent Outpatient Visits           1 month ago Essential hypertension   [place] [facility] [place], [place] T, NP   2 months ago Chronic obstructive pulmonary disease, unspecified COPD type (HCC)   [facility] [place], Corrie Dandy T, NP   5 months ago Essential  hypertension   [place] [facility] [place], [place] T, NP   8 months ago Medicare annual wellness visit, subsequent   [place] [facility] [place], [place] T, NP   1 year ago Recurrent major depressive disorder, in full remission [facility])   [place] [facility] [place], Dorie Rank, NP       Future Appointments             In 3 weeks Cobb, Ruby Cola, NP [place] [facility] [place]   In 3 months Cannady, Dorie Rank, NP [place] [facility], [facility]

## 2023-06-22 NOTE — Telephone Encounter (Signed)
Pharmacy Patient Advocate Encounter  Received notification from [facility] that Prior Authorization for rOPINIRole HCl ER 6MG  er tablets has been APPROVED from 06-22-2023 to 09-24-2023. Ran test claim, Copay is $10.00. This test claim was processed through [facility]- copay amounts may vary at other pharmacies due to pharmacy/plan contracts, or as the patient moves through the different stages of their insurance plan.   PA #/Case ID/Reference #: [id number]

## 2023-06-22 NOTE — Telephone Encounter (Signed)
[facility] pharmacy advised of PA approval for Ropinirole. They will contact patient. Nothing further needed. sd

## 2023-06-22 NOTE — Telephone Encounter (Signed)
Pharmacy Patient Advocate Encounter   Received notification from Patient Advice Request messages that prior authorization for rOPINIRole HCl ER 6MG  er tablets is required/requested.   Insurance verification completed.   The patient is insured through [employer] .   Per test claim: PA required; PA submitted to [facility] via CoverMyMeds Key/confirmation #/EOC [id number] Status is pending

## 2023-06-24 ENCOUNTER — Other Ambulatory Visit: Payer: Self-pay | Admitting: Nurse Practitioner

## 2023-06-25 NOTE — Telephone Encounter (Signed)
Requested Prescriptions  Pending Prescriptions Disp Refills   levothyroxine (SYNTHROID) 150 MCG tablet [Pharmacy Med Name: LEVOTHYROXINE 150 MCG TABLET] 90 tablet 0    Sig: TAKE 1 TABLET BY MOUTH EVERY DAY     Endocrinology:  Hypothyroid Agents Passed - 06/24/2023  8:31 AM      Passed - TSH in normal range and within 360 days    TSH  Date Value Ref Range Status  10/04/2022 0.678 0.450 - 4.500 uIU/mL Final         Passed - Valid encounter within last 12 months    Recent Outpatient Visits           1 month ago Essential hypertension   [place] [facility] [place], [place] T, NP   2 months ago Chronic obstructive pulmonary disease, unspecified COPD type (HCC)   [place] [facility] [place], Dorie Rank, NP   5 months ago Essential hypertension   [place] [facility] [place], [place] T, NP   8 months ago Medicare annual wellness visit, subsequent   [place] [facility] [place], [place] T, NP   1 year ago Recurrent major depressive disorder, in full remission (HCC)   [place] [facility] [place], Dorie Rank, NP       Future Appointments             In 3 weeks Cobb, Ruby Cola, NP [place] [facility] [place]   In 3 months Cannady, Dorie Rank, NP [place] [facility], [facility]

## 2023-06-30 ENCOUNTER — Other Ambulatory Visit: Payer: Self-pay | Admitting: Nurse Practitioner

## 2023-06-30 DIAGNOSIS — I10 Essential (primary) hypertension: Secondary | ICD-10-CM

## 2023-07-02 ENCOUNTER — Encounter: Payer: Self-pay | Admitting: Nurse Practitioner

## 2023-07-02 NOTE — Telephone Encounter (Signed)
Requested Prescriptions  Pending Prescriptions Disp Refills   losartan (COZAAR) 100 MG tablet [Pharmacy Med Name: LOSARTAN POTASSIUM 100 MG TAB] 90 tablet 1    Sig: TAKE 1 TABLET BY MOUTH EVERY DAY     Cardiovascular:  Angiotensin Receptor Blockers Passed - 06/30/2023  8:46 AM      Passed - Cr in normal range and within 180 days    Creatinine  Date Value Ref Range Status  06/18/2013 0.96 0.60 - 1.30 mg/dL Final   Creatinine, Ser  Date Value Ref Range Status  05/09/2023 0.72 0.57 - 1.00 mg/dL Final         Passed - K in normal range and within 180 days    Potassium  Date Value Ref Range Status  05/09/2023 4.3 3.5 - 5.2 mmol/L Final  06/18/2013 3.6 3.5 - 5.1 mmol/L Final         Passed - Patient is not pregnant      Passed - Last BP in normal range    BP Readings from Last 1 Encounters:  05/24/23 138/78         Passed - Valid encounter within last 6 months    Recent Outpatient Visits           2 months ago Essential hypertension   [place] [facility] [place], [place] T, NP   3 months ago Chronic obstructive pulmonary disease, unspecified COPD type (HCC)   [place] [facility] [place], Dorie Rank, NP   5 months ago Essential hypertension   [place] [facility] [place], [place] T, NP   9 months ago Medicare annual wellness visit, subsequent   [place] [facility] [place], [place] T, NP   1 year ago Recurrent major depressive disorder, in full remission (HCC)   [place] [facility] [place], Corrie Dandy T, NP       Future Appointments             In 2 weeks Cobb, Ruby Cola, NP [place] [facility] [place]   In 3 months Cannady, Dorie Rank, NP  [facility], [facility]             chlorthalidone (HYGROTON) 25 MG tablet [Pharmacy Med Name: CHLORTHALIDONE 25 MG TABLET] 45 tablet 1    Sig: TAKE 1/2 TABLET BY MOUTH EVERY DAY     Cardiovascular: Diuretics -  Thiazide Passed - 06/30/2023  8:46 AM      Passed - Cr in normal range and within 180 days    Creatinine  Date Value Ref Range Status  06/18/2013 0.96 0.60 - 1.30 mg/dL Final   Creatinine, Ser  Date Value Ref Range Status  05/09/2023 0.72 0.57 - 1.00 mg/dL Final         Passed - K in normal range and within 180 days    Potassium  Date Value Ref Range Status  05/09/2023 4.3 3.5 - 5.2 mmol/L Final  06/18/2013 3.6 3.5 - 5.1 mmol/L Final         Passed - Na in normal range and within 180 days    Sodium  Date Value Ref Range Status  05/09/2023 134 134 - 144 mmol/L Final  06/18/2013 132 (L) 136 - 145 mmol/L Final         Passed - Last BP in normal range    BP Readings from Last 1 Encounters:  05/24/23 138/78         Passed - Valid encounter  within last 6 months    Recent Outpatient Visits           2 months ago Essential hypertension   [facility] [place], [place] T, NP   3 months ago Chronic obstructive pulmonary disease, unspecified COPD type (HCC)   [place] [facility] [place], Dorie Rank, NP   5 months ago Essential hypertension   [place] [facility] [place], Dorie Rank, NP   9 months ago Medicare annual wellness visit, subsequent   [place] [facility] [place], Corrie Dandy T, NP   1 year ago Recurrent major depressive disorder, in full remission [facility])   [place] [facility] [place], Dorie Rank, NP       Future Appointments             In 2 weeks Cobb, Ruby Cola, NP [place] [facility] [place]   In 3 months Cannady, Dorie Rank, NP [place] [facility] [place], [facility]

## 2023-07-12 ENCOUNTER — Encounter: Payer: Self-pay | Admitting: Internal Medicine

## 2023-07-12 NOTE — Telephone Encounter (Signed)
Dr. Belia Heman, please advise. Thanks

## 2023-07-16 DIAGNOSIS — J449 Chronic obstructive pulmonary disease, unspecified: Secondary | ICD-10-CM | POA: Diagnosis not present

## 2023-07-18 ENCOUNTER — Telehealth (INDEPENDENT_AMBULATORY_CARE_PROVIDER_SITE_OTHER): Payer: Medicare PPO | Admitting: Nurse Practitioner

## 2023-07-18 ENCOUNTER — Encounter: Payer: Self-pay | Admitting: Nurse Practitioner

## 2023-07-18 DIAGNOSIS — J449 Chronic obstructive pulmonary disease, unspecified: Secondary | ICD-10-CM

## 2023-07-18 DIAGNOSIS — G4733 Obstructive sleep apnea (adult) (pediatric): Secondary | ICD-10-CM

## 2023-07-18 NOTE — Progress Notes (Signed)
Patient ID: Tiffany Delacruz, female     DOB: 15-Sep-1951, 72 y.o.      MRN: [id number]  Chief Complaint  Patient presents with   Follow-up    OSA not using CPAP     Virtual Visit via Video Note  I connected with Tiffany Delacruz on 07/18/23 at  8:30 AM EDT by a video enabled telemedicine application and verified that I am speaking with the correct person using two identifiers.  Location: Patient: Home Provider: Office   I discussed the limitations of evaluation and management by telemedicine and the availability of in person appointments. The patient expressed understanding and agreed to proceed.  History of Present Illness: 72 year old female, never smoker followed for OSA intolerant CPAP and DOE. She is a patient of Dr. Clovis Fredrickson and last seen in office 05/24/2023. Past medical history significant for HTN, GERD, hypothyroid, depression, HLD.    TEST/EVENTS:  10/09/2019 cardiac CT: atherosclerosis. No LAD. Visualized lungs are clear. Lingular scarring. Low density liver lesions, cysts. Mild right hemidiaphragm elevation. Coronary calcium 0. No evidence of CAD; CAD-RADS 0. 10/17/2022 normal stress test; EF 71% 12/15/2022 echo: EF 60-65%, GIDD. RV size and function nl. LA mildly dilated. Mild MR.  03/14/2023 LHC/RHC: normal left and right pressures. Borderline elevated PASP. Normal CO/CI. No obstructive disease.  04/17/2023 Split night: AHI 60/h, SpO2 low 84% >> central events with CPAP; transitioned to BiPAP 12/7 cmH2O   03/06/2023: OV with Dr. Belia Heman. Noncompliant with CPAP due to hating the mask. On 1 lpm [place] at night. Probable diagnosis of underlying COPD. Having exertional chest pain for the last 2 months. Hold off on PFTs until she has further workup for this - referred urgently to cardiology. Change from [facility] to Trelegy. Recommend weight loss measures.    03/23/2023: OV with Katheren Jimmerson NP for follow up. She's still having trouble with her breathing. She had left and right heart cath without any  significant findings. She had borderline elevated pulmonary artery pressures. She has had normal stress testing as well. She has not had PFTs as of yet. She tells me that she can't do anything around the house without getting out of breath. She has trouble walking longer distances. She doesn't notice a difference with the Trelegy. She denies any cough, chest congestion, wheezing, leg swelling, orthopnea, PND, palpitations. She does have some exertional chest pressure/pain; workup has been unremarkable thus far. She would like to start working on weight loss measures. She has not had a repeat sleep study since 1991. She tells me her sleep apnea was mild at the time. She couldn't tolerate the CPAP mask. She wears oxygen at night 1 lpm. She has daytime fatigue and snoring. Denies any drowsy driving or morning headaches.   05/24/2023: OV with Dr. Belia Heman. Split night >> AHI 60/h; central apneas with CPAP. BiPAP 12/7 adequate. Has appt 9/4 to get set up with BiPAP. PFT reviewed - ATS criteria not met. No restrictive or obstructive process. Currently on 1 lpm supplemental O2 at night. Recommend weight loss. Sleep hygiene reviewed. RLS - start Requip. Suspected to have COPD based on secondhand smoke exposure - continue trelegy.   07/18/2023: Today - follow up Patient presents today via virtual visit.  She was started on BiPAP after her last visit.  She tells me that she was unable to tolerate this so she returned the machine and stopped using it.  She also stopped using the Requip due to side effects.  Feels tired during the day.  Has some restless sleep at night.  She denies any drowsy driving or morning headaches.  Not interested in restarting any sort of PAP therapy.  She is also not interested in hypoglossal nerve stimulator. Her breathing overall is stable.  Has not had any exacerbations requiring steroids or antibiotics.  She uses her Trelegy daily.  Uses albuterol few times a week.  No wheezing, cough, chest  congestion.  Allergies  Allergen Reactions   Requip [Ropinirole] Nausea Only   Immunization History  Administered Date(s) Administered   Fluad Quad(high Dose 65+) 06/27/2019, 10/28/2020, 08/26/2021, 10/04/2022   Influenza, High Dose Seasonal PF 07/17/2016   Influenza,inj,Quad PF,6+ Mos 10/20/2015   Influenza-Unspecified 07/09/2014   PFIZER(Purple Top)SARS-COV-2 Vaccination 05/30/2020, 06/20/2020   Pneumococcal Conjugate-13 10/20/2015   Pneumococcal Polysaccharide-23 06/24/2012, 11/09/2017   Td 09/25/2001   Tdap 12/23/2012   Past Medical History:  Diagnosis Date   Anxiety    Depression    Diastolic dysfunction    a. 11/2022 Echo: EF 60-65%, no rmwa, GrI DD, nl RV fxn, mildly dil LA, mild MR, asc Ao 37mm.   GERD (gastroesophageal reflux disease)    Graves disease    Hyperlipidemia    Hypertension    Hypothyroidism    Obesity    Osteoarthritis    Precordial pain    a. 09/2022 MV: EF 71%, no isch/infarct; b. 02/2023 Cath: nl cors. Nl R heart filling pressures. CO/CI 7.0/3.2.   Sleep apnea    a. Doesn't tolerate CPAP.   Wheezing 07/14/2018    Tobacco History: Social History   Tobacco Use  Smoking Status Never  Smokeless Tobacco Never   Counseling given: Not Answered   Outpatient Medications Prior to Visit  Medication Sig Dispense Refill   acetaminophen (TYLENOL) 650 MG CR tablet Take 1,300 mg by mouth every 8 (eight) hours as needed for pain.     albuterol (VENTOLIN HFA) 108 (90 Base) MCG/ACT inhaler TAKE 2 PUFFS BY MOUTH EVERY 6 HOURS AS NEEDED FOR WHEEZE OR SHORTNESS OF BREATH 17 each 0   b complex vitamins tablet Take 1 tablet by mouth daily.     chlorthalidone (HYGROTON) 25 MG tablet TAKE 1/2 TABLET BY MOUTH EVERY DAY 45 tablet 1   famotidine (PEPCID) 20 MG tablet Take 20 mg by mouth as needed.     FLUoxetine (PROZAC) 40 MG capsule Take 40 mg by mouth daily.     fluticasone (FLONASE) 50 MCG/ACT nasal spray SPRAY 2 SPRAYS INTO EACH NOSTRIL EVERY DAY 48 mL 1    Fluticasone-Umeclidin-Vilant (TRELEGY ELLIPTA) 200-62.5-25 MCG/ACT AEPB Inhale 1 Act into the lungs daily. 1 each 5   levothyroxine (SYNTHROID) 150 MCG tablet TAKE 1 TABLET BY MOUTH EVERY DAY 90 tablet 0   losartan (COZAAR) 100 MG tablet TAKE 1 TABLET BY MOUTH EVERY DAY 90 tablet 1   Melatonin 10 MG CHEW Chew by mouth at bedtime.     OVER THE COUNTER MEDICATION as needed. Allergy eye drops     rosuvastatin (CRESTOR) 10 MG tablet TAKE 1 TABLET BY MOUTH EVERY DAY 90 tablet 2   nitroGLYCERIN (NITROSTAT) 0.4 MG SL tablet Place 1 tablet (0.4 mg total) under the tongue every 5 (five) minutes as needed for chest pain (maximum of 3 doses.). 25 tablet 2   Ropinirole HCl (REQUIP XL) 6 MG TB24 Take 1 tablet (6 mg total) by mouth at bedtime as needed. 30 tablet 1   No facility-administered medications prior to visit.     Review of Systems:   Constitutional:  No weight loss or gain, night sweats, fevers, chills, or lassitude. +fatigue  HEENT: No headaches, difficulty swallowing, tooth/dental problems, or sore throat. No sneezing, itching, ear ache, nasal congestion, or post nasal drip CV:  No chest pain, orthopnea, PND, swelling in lower extremities, anasarca, dizziness, palpitations, syncope Resp: +shortness of breath with exertion. No excess mucus or change in color of mucus. No productive or non-productive. No hemoptysis. No wheezing.  No chest wall deformity GI:  No heartburn, indigestion, abdominal pain, nausea, vomiting, diarrhea, change in bowel habits, loss of appetite, bloody stools.  GU: No dysuria, change in color of urine, urgency or frequency.  Skin: No rash, lesions, ulcerations MSK:  No joint pain or swelling.   Neuro: No dizziness or lightheadedness.  Psych: No depression or anxiety. Mood stable. +sleep disturbance  Observations/Objective: Patient is well-developed, well-nourished in no acute distress. A&Ox3. Resting comfortably at home. Unlabored breathing. Speech is clear and coherent  with logical content.    Assessment and Plan: OSA (obstructive sleep apnea) Severe OSA; intolerant of CPAP. We reviewed risks of untreated severe OSA and potential treatment options. With her current BMI, she would not qualify for hypoglossal nerve stimulator. She is also not interested in this. Discussed that many of her sleep problems are likely related to her untreated severe OSA. Would not recommend any sedating medications at this point as this could worsen her sleep apnea. Healthy weight loss encouraged. Reviewed safe driving practices. She understands the risks to her health associated with leaving her sleep apnea untreated.   Patient Instructions  Continue Albuterol inhaler 2 puffs every 6 hours as needed for shortness of breath or wheezing. Notify if symptoms persist despite rescue inhaler/neb use. Continue Trelegy 1 puff daily. Brush tongue and rinse mouth afterwards.    Your sleep study showed severe sleep apnea. We discussed how untreated sleep apnea puts an individual at risk for cardiac arrhthymias, pulm HTN, DM, stroke and increases their risk for daytime accidents. We also briefly reviewed treatment options including weight loss, side sleeping position, CPAP therapy or referral to ENT for possible surgical options. You are not interested in hypoglossal nerve stimulator procedure and you do not want to retry CPAP. We discussed that sedating medications are not recommended with untreated sleep apnea, as this can worsen the events.   Use caution when driving and pull over if you become sleepy    Follow up in 6 months with Dr. Belia Heman or Philis Nettle. If symptoms do not improve or worsen, please contact office for sooner follow up or seek emergency care.    COPD (chronic obstructive pulmonary disease) (HCC) Compensated on current regimen. No acute exacerbations. Action plan in place.   Morbid obesity (HCC) Healthy weight loss encouraged     I discussed the assessment and treatment  plan with the patient. The patient was provided an opportunity to ask questions and all were answered. The patient agreed with the plan and demonstrated an understanding of the instructions.   The patient was advised to call back or seek an in-person evaluation if the symptoms worsen or if the condition fails to improve as anticipated.  I provided 25 minutes of non-face-to-face time during this encounter.   Noemi Chapel, NP

## 2023-07-18 NOTE — Patient Instructions (Signed)
Continue Albuterol inhaler 2 puffs every 6 hours as needed for shortness of breath or wheezing. Notify if symptoms persist despite rescue inhaler/neb use. Continue Trelegy 1 puff daily. Brush tongue and rinse mouth afterwards.    Your sleep study showed severe sleep apnea. We discussed how untreated sleep apnea puts an individual at risk for cardiac arrhthymias, pulm HTN, DM, stroke and increases their risk for daytime accidents. We also briefly reviewed treatment options including weight loss, side sleeping position, CPAP therapy or referral to ENT for possible surgical options. You are not interested in hypoglossal nerve stimulator procedure and you do not want to retry CPAP. We discussed that sedating medications are not recommended with untreated sleep apnea, as this can worsen the events.   Use caution when driving and pull over if you become sleepy    Follow up in 6 months with Dr. Belia Heman or Philis Nettle. If symptoms do not improve or worsen, please contact office for sooner follow up or seek emergency care.

## 2023-07-18 NOTE — Assessment & Plan Note (Signed)
Compensated on current regimen. No acute exacerbations. Action plan in place.

## 2023-07-18 NOTE — Assessment & Plan Note (Signed)
Healthy weight loss encouraged 

## 2023-07-18 NOTE — Assessment & Plan Note (Addendum)
Severe OSA; intolerant of CPAP. We reviewed risks of untreated severe OSA and potential treatment options. With her current BMI, she would not qualify for hypoglossal nerve stimulator. She is also not interested in this. Discussed that many of her sleep problems are likely related to her untreated severe OSA. Would not recommend any sedating medications at this point as this could worsen her sleep apnea. Healthy weight loss encouraged. Reviewed safe driving practices. She understands the risks to her health associated with leaving her sleep apnea untreated.   Patient Instructions  Continue Albuterol inhaler 2 puffs every 6 hours as needed for shortness of breath or wheezing. Notify if symptoms persist despite rescue inhaler/neb use. Continue Trelegy 1 puff daily. Brush tongue and rinse mouth afterwards.    Your sleep study showed severe sleep apnea. We discussed how untreated sleep apnea puts an individual at risk for cardiac arrhthymias, pulm HTN, DM, stroke and increases their risk for daytime accidents. We also briefly reviewed treatment options including weight loss, side sleeping position, CPAP therapy or referral to ENT for possible surgical options. You are not interested in hypoglossal nerve stimulator procedure and you do not want to retry CPAP. We discussed that sedating medications are not recommended with untreated sleep apnea, as this can worsen the events.   Use caution when driving and pull over if you become sleepy    Follow up in 6 months with Dr. Belia Heman or Philis Nettle. If symptoms do not improve or worsen, please contact office for sooner follow up or seek emergency care.

## 2023-07-30 ENCOUNTER — Other Ambulatory Visit: Payer: Self-pay | Admitting: Nurse Practitioner

## 2023-07-31 NOTE — Telephone Encounter (Signed)
Requested medication (s) are due for refill today: For review  Requested medication (s) are on the active medication list: NO    Last refill: 05/07/23 #20  0 refills  Future visit scheduled yes 10/08/23  Notes to clinic  Med is NOT on current med profile. Cannot refuse non-delegated meds per  Requested Prescriptions  Pending Prescriptions Disp Refills   HYDROcodone-acetaminophen (NORCO/VICODIN) 5-325 MG tablet [Pharmacy Med Name: HYDROCODONE-ACETAMIN 5-325 MG] 20 tablet 0    Sig: Take 1 tablet by mouth every 6 (six) hours as needed for up to 5 days for moderate pain.     Not Delegated - Analgesics:  Opioid Agonist Combinations Failed - 07/30/2023  1:38 PM      Failed - This refill cannot be delegated      Failed - Urine Drug Screen completed in last 360 days      Passed - Valid encounter within last 3 months    Recent Outpatient Visits           3 months ago Essential hypertension   [place] [facility] [place], Olivia T, NP   3 months ago Chronic obstructive pulmonary disease, unspecified COPD type (HCC)   [place] [facility] [place], Dorie Rank, NP   6 months ago Essential hypertension   [place] [facility] [place], Frederick T, NP   10 months ago Medicare annual wellness visit, subsequent   [place] [facility] [place], [place] T, NP   1 year ago Recurrent major depressive disorder, in full remission (HCC)   [place] [facility] [place], Dorie Rank, NP       Future Appointments             In 2 months Cannady, Dorie Rank, NP [place] [facility], [facility]

## 2023-08-01 ENCOUNTER — Encounter: Payer: Self-pay | Admitting: Nurse Practitioner

## 2023-08-16 DIAGNOSIS — J449 Chronic obstructive pulmonary disease, unspecified: Secondary | ICD-10-CM | POA: Diagnosis not present

## 2023-09-09 ENCOUNTER — Other Ambulatory Visit: Payer: Self-pay | Admitting: Nurse Practitioner

## 2023-09-10 NOTE — Telephone Encounter (Signed)
Requested medication (s) are due for refill today: Amount not specified  Requested medication (s) are on the active medication list: yes    Last refill: 01/07/23  No quantity noted  Future visit scheduled yes 10/07/22  Notes to clinic:Historical provider, please review. Thank you.  Requested Prescriptions  Pending Prescriptions Disp Refills   FLUoxetine (PROZAC) 40 MG capsule [Pharmacy Med Name: FLUOXETINE HCL 40 MG CAPSULE] 90 capsule 4    Sig: TAKE 1 CAPSULE (40 MG TOTAL) BY MOUTH DAILY.     Psychiatry:  Antidepressants - SSRI Passed - 09/10/2023  4:05 PM      Passed - Completed PHQ-2 or PHQ-9 in the last 360 days      Passed - Valid encounter within last 6 months    Recent Outpatient Visits           4 months ago Essential hypertension   [place] [facility] [place], [place] T, NP   5 months ago Chronic obstructive pulmonary disease, unspecified COPD type (HCC)   [place] [facility] [place], Dorie Rank, NP   8 months ago Essential hypertension   [place] [facility] [place], [place] T, NP   11 months ago Medicare annual wellness visit, subsequent   [place] [facility] [place], Sever[place] T, NP   1 year ago Recurrent major depressive disorder, in full remission (HCC)   [place] [facility] [place], Dorie Rank, NP       Future Appointments             In 4 weeks Cannady, Dorie Rank, NP [place] [facility], [facility]

## 2023-09-15 DIAGNOSIS — J449 Chronic obstructive pulmonary disease, unspecified: Secondary | ICD-10-CM | POA: Diagnosis not present

## 2023-09-28 ENCOUNTER — Other Ambulatory Visit: Payer: Self-pay | Admitting: Nurse Practitioner

## 2023-10-01 NOTE — Telephone Encounter (Signed)
 Requested Prescriptions  Pending Prescriptions Disp Refills   levothyroxine  (SYNTHROID ) 150 MCG tablet [Pharmacy Med Name: LEVOTHYROXINE  150 MCG TABLET] 90 tablet 0    Sig: TAKE 1 TABLET BY MOUTH EVERY DAY     Endocrinology:  Hypothyroid Agents Failed - 10/01/2023  3:42 PM      Failed - TSH in normal range and within 360 days    TSH  Date Value Ref Range Status  10/04/2022 0.678 0.450 - 4.500 uIU/mL Final         Passed - Valid encounter within last 12 months    Recent Outpatient Visits           5 months ago Essential hypertension   [place] [facility] [place], [place] T, NP   6 months ago Chronic obstructive pulmonary disease, unspecified COPD type (HCC)   [place] [facility] [place], Melanie DASEN, NP   8 months ago Essential hypertension   [place] [facility] [place], [place] T, NP   12 months ago Medicare annual wellness visit, subsequent   [place] [facility] [place], [place] T, NP   1 year ago Recurrent major depressive disorder, in full remission (HCC)   [place] [facility] [place], Melanie DASEN, NP       Future Appointments             In 1 week Cannady, Melanie DASEN, NP  [facility], [facility]

## 2023-10-06 DIAGNOSIS — E559 Vitamin D deficiency, unspecified: Secondary | ICD-10-CM | POA: Insufficient documentation

## 2023-10-06 NOTE — Patient Instructions (Signed)

## 2023-10-08 ENCOUNTER — Ambulatory Visit: Payer: Medicare PPO | Admitting: Nurse Practitioner

## 2023-10-08 ENCOUNTER — Encounter: Payer: Self-pay | Admitting: Nurse Practitioner

## 2023-10-08 VITALS — BP 132/78 | HR 78 | Temp 97.6°F | Ht 66.0 in | Wt 255.2 lb

## 2023-10-08 DIAGNOSIS — F3342 Major depressive disorder, recurrent, in full remission: Secondary | ICD-10-CM | POA: Diagnosis not present

## 2023-10-08 DIAGNOSIS — E871 Hypo-osmolality and hyponatremia: Secondary | ICD-10-CM

## 2023-10-08 DIAGNOSIS — I2089 Other forms of angina pectoris: Secondary | ICD-10-CM

## 2023-10-08 DIAGNOSIS — E559 Vitamin D deficiency, unspecified: Secondary | ICD-10-CM | POA: Diagnosis not present

## 2023-10-08 DIAGNOSIS — E039 Hypothyroidism, unspecified: Secondary | ICD-10-CM

## 2023-10-08 DIAGNOSIS — I700 Atherosclerosis of aorta: Secondary | ICD-10-CM

## 2023-10-08 DIAGNOSIS — Z23 Encounter for immunization: Secondary | ICD-10-CM

## 2023-10-08 DIAGNOSIS — J4530 Mild persistent asthma, uncomplicated: Secondary | ICD-10-CM

## 2023-10-08 DIAGNOSIS — I10 Essential (primary) hypertension: Secondary | ICD-10-CM | POA: Diagnosis not present

## 2023-10-08 DIAGNOSIS — E782 Mixed hyperlipidemia: Secondary | ICD-10-CM | POA: Diagnosis not present

## 2023-10-08 DIAGNOSIS — G4733 Obstructive sleep apnea (adult) (pediatric): Secondary | ICD-10-CM

## 2023-10-08 MED ORDER — LOSARTAN POTASSIUM 100 MG PO TABS: 100.0000 mg | ORAL_TABLET | Freq: Every day | ORAL | 4 refills | Status: DC

## 2023-10-08 MED ORDER — ROSUVASTATIN CALCIUM 10 MG PO TABS: 10.0000 mg | ORAL_TABLET | Freq: Every day | ORAL | 4 refills | Status: DC

## 2023-10-08 NOTE — Assessment & Plan Note (Signed)
 BMI 41.19.  Recommend to continue this.  Recommended eating smaller high protein, low fat meals more frequently and exercising 30 mins a day 5 times a week with a goal of 10-15lb weight loss in the next 3 months. Patient voiced their understanding and motivation to adhere to these recommendations.

## 2023-10-08 NOTE — Assessment & Plan Note (Signed)
Chronic.  Noted on past imaging, recommend she take a Baby ASA daily and continue statin therapy.  Check lipid panel today.

## 2023-10-08 NOTE — Assessment & Plan Note (Signed)
Chronic and stable with no recent CP or NTG use.  Continue collaboration with cardiology.  Recent notes and testing reviewed.

## 2023-10-08 NOTE — Progress Notes (Signed)
 BP 132/78 (BP Location: Left Arm)   Pulse 78   Temp 97.6 F (36.4 C) (Oral)   Ht 5' 6 (1.676 m)   Wt 255 lb 3.2 oz (115.8 kg)   LMP 12/15/1991 (Approximate)   SpO2 92%   BMI 41.19 kg/m    Subjective:    Patient ID: Tiffany Delacruz, female    DOB: 15-Apr-1951, 73 y.o.   MRN: [id number]  HPI: Tiffany Delacruz is a 73 y.o. female  Chief Complaint  Patient presents with   Depression   Hyperlipidemia   Hypertension   Shortness of Breath   HYPERTENSION / HYPERLIPIDEMIA Taking Losartan , Chlorthalidone , and Rosuvastatin . Saw cardiology last 04/06/23 with heart cath performed 03/14/23.  Pulmonary last 07/18/23, had OSA testing which was positive but did not tolerate CPAP and per note she does not qualify for Inspire.  Is using Trelegy inhaler occasionally for SOB, does not use every day.. Aortic atherosclerosis noted on past imaging.  Satisfied with current treatment? yes Duration of hypertension: chronic BP monitoring frequency: ever couple days BP range: on average <130/80, often in 110/60 range BP medication side effects: no Duration of hyperlipidemia: chronic Aspirin : no Recent stressors: no Recurrent headaches: no Visual changes: no Palpitations: no Dyspnea: improving Chest pain: no Lower extremity edema: a little bit on occasion Dizzy/lightheaded: no The 10-year ASCVD risk score (Arnett DK, et al., 2019) is: 19.3%   Values used to calculate the score:     Age: 73 years     Sex: Female     Is Non-Hispanic African American: No     Diabetic: No     Tobacco smoker: No     Systolic Blood Pressure: 132 mmHg     Is BP treated: Yes     HDL Cholesterol: 68 mg/dL     Total Cholesterol: 146 mg/dL  HYPOTHYROIDISM Continues on Levothyroxine  150 MCG.  Thyroid  control status:stable Satisfied with current treatment? yes Medication side effects: no Medication compliance: good compliance Etiology of hypothyroidism:  Recent dose adjustment: none Fatigue: seems to be a little bit  better Cold intolerance: no Heat intolerance: no Weight gain: no Weight loss: no Constipation: yes Diarrhea/loose stools: no Palpitations: no Lower extremity edema: occasionally Anxiety/depressed mood: no  DEPRESSION Continues on Prozac  daily.  Continues Vitamin D  daily for history of low levels. Mood status: stable Satisfied with current treatment?: yes Symptom severity: moderate  Duration of current treatment : chronic Side effects: no Medication compliance: good compliance Depressed mood: no Anxious mood: no Anhedonia: no Significant weight loss or gain: no Insomnia: no Fatigue: seems to be improving Feelings of worthlessness or guilt: no Impaired concentration/indecisiveness: no Suicidal ideations: no Hopelessness: no Crying spells: no    10/08/2023    9:24 AM 04/03/2023    2:09 PM 01/12/2023    4:04 PM 10/04/2022   10:22 AM 04/03/2022    9:37 AM  Depression screen PHQ 2/9  Decreased Interest 0 0 0 1 2  Down, Depressed, Hopeless 0 0 0 0 2  PHQ - 2 Score 0 0 0 1 4  Altered sleeping 0 0 0 1 0  Tired, decreased energy 0 3 0 3 1  Change in appetite 0 0 2 0 1  Feeling bad or failure about yourself  0 0 0 0 0  Trouble concentrating 0 0 0 0 0  Moving slowly or fidgety/restless 0 0 0 0 0  Suicidal thoughts 0 0 0 0 0  PHQ-9 Score 0 3 2 5 6   Difficult doing  work/chores Not difficult at all Extremely dIfficult Somewhat difficult Not difficult at all Not difficult at all       10/08/2023    9:24 AM 04/03/2023    2:09 PM 01/12/2023    4:04 PM 10/04/2022   10:22 AM  GAD 7 : Generalized Anxiety Score  Nervous, Anxious, on Edge 0 0 0 0  Control/stop worrying 0 0 0 0  Worry too much - different things 0 0 0 0  Trouble relaxing 0 0 0 0  Restless 0 0 0 0  Easily annoyed or irritable 0 0 0 0  Afraid - awful might happen 0 0 0 0  Total GAD 7 Score 0 0 0 0  Anxiety Difficulty Not difficult at all Not difficult at all Not difficult at all Not difficult at all   Relevant past  medical, surgical, family and social history reviewed and updated as indicated. Interim medical history since our last visit reviewed. Allergies and medications reviewed and updated.  Review of Systems  Constitutional:  Positive for fatigue. Negative for activity change, appetite change, diaphoresis and fever.  Respiratory:  Positive for shortness of breath. Negative for cough, chest tightness and wheezing.   Cardiovascular:  Positive for leg swelling. Negative for chest pain and palpitations.  Gastrointestinal: Negative.   Endocrine: Negative for cold intolerance and heat intolerance.  Genitourinary: Negative.   Musculoskeletal:  Positive for arthralgias.  Neurological: Negative.   Psychiatric/Behavioral: Negative.      Per HPI unless specifically indicated above     Objective:    BP 132/78 (BP Location: Left Arm)   Pulse 78   Temp 97.6 F (36.4 C) (Oral)   Ht 5' 6 (1.676 m)   Wt 255 lb 3.2 oz (115.8 kg)   LMP 12/15/1991 (Approximate)   SpO2 92%   BMI 41.19 kg/m   Wt Readings from Last 3 Encounters:  10/08/23 255 lb 3.2 oz (115.8 kg)  05/24/23 250 lb (113.4 kg)  05/01/23 252 lb (114.3 kg)    Physical Exam Vitals and nursing note reviewed.  Constitutional:      General: She is awake. She is not in acute distress.    Appearance: She is well-developed and well-groomed. She is obese. She is not ill-appearing.  HENT:     Head: Normocephalic.     Right Ear: Hearing normal. No drainage.     Left Ear: Hearing normal. No drainage.  Eyes:     General: Lids are normal.        Right eye: No discharge.        Left eye: No discharge.     Conjunctiva/sclera: Conjunctivae normal.     Pupils: Pupils are equal, round, and reactive to light.  Neck:     Thyroid : No thyromegaly.     Vascular: No carotid bruit.  Cardiovascular:     Rate and Rhythm: Normal rate and regular rhythm.     Heart sounds: Normal heart sounds. No murmur heard.    No gallop.  Pulmonary:     Effort:  Pulmonary effort is normal. No accessory muscle usage or respiratory distress.     Breath sounds: Normal breath sounds.  Abdominal:     General: Bowel sounds are normal. There is no distension.     Palpations: Abdomen is soft.     Tenderness: There is no abdominal tenderness.  Musculoskeletal:     Cervical back: Normal range of motion and neck supple.     Right lower leg: No edema.  Left lower leg: No edema.  Lymphadenopathy:     Head:     Right side of head: No submental, submandibular, tonsillar, preauricular or posterior auricular adenopathy.     Left side of head: No submental, submandibular, tonsillar, preauricular or posterior auricular adenopathy.     Cervical: No cervical adenopathy.  Skin:    General: Skin is warm and dry.  Neurological:     Mental Status: She is alert and oriented to person, place, and time.     Gait: Gait is intact.     Deep Tendon Reflexes: Reflexes are normal and symmetric.     Reflex Scores:      Brachioradialis reflexes are 2+ on the right side and 2+ on the left side.      Patellar reflexes are 2+ on the right side and 2+ on the left side. Psychiatric:        Attention and Perception: Attention normal.        Mood and Affect: Mood normal.        Speech: Speech normal.        Behavior: Behavior normal. Behavior is cooperative.        Thought Content: Thought content normal.        Judgment: Judgment normal.    Results for orders placed or performed in visit on 05/18/23  VAS US  ABI WITH/WO TBI   Collection Time: 05/18/23  9:54 AM  Result Value Ref Range   Right ABI 1.18    Left ABI 1.1       Assessment & Plan:   Problem List Items Addressed This Visit       Cardiovascular and Mediastinum   Aortic atherosclerosis (HCC)   Chronic.  Noted on past imaging, recommend she take a Baby ASA daily and continue statin therapy.  Check lipid panel today.      Relevant Medications   losartan  (COZAAR ) 100 MG tablet   rosuvastatin  (CRESTOR ) 10 MG  tablet   Other Relevant Orders   Comprehensive metabolic panel   Lipid Panel w/o Chol/HDL Ratio   Essential hypertension   Chronic, stable.  BP at goal today in office and at goal on home checks.  Recommend she monitor BP at least a few mornings a week at home and document.  DASH diet at home.  Continue current medication regimen and adjust as needed.  Labs today:CBC, CMP, TSH.       Relevant Medications   losartan  (COZAAR ) 100 MG tablet   rosuvastatin  (CRESTOR ) 10 MG tablet   Other Relevant Orders   CBC with Differential/Platelet   Comprehensive metabolic panel   Vitamin B12   Stable angina (HCC) - Primary   Chronic and stable with no recent CP or NTG use.  Continue collaboration with cardiology.  Recent notes and testing reviewed.      Relevant Medications   losartan  (COZAAR ) 100 MG tablet   rosuvastatin  (CRESTOR ) 10 MG tablet     Respiratory   OSA (obstructive sleep apnea)   Ongoing, did not tolerate CPAP and at this time does not qualify for [facility] - on review of pulmonary note.  Continue to monitor and continue collaboration with pulmonary.      RAD (reactive airway disease), mild persistent, uncomplicated   Chronic, ongoing.  SOB and fatigue improving.  Continue collaboration with pulmonary and cardiology, recent notes reviewed.  Will continue current inhaler regimen as ordered.        Endocrine   Hypothyroidism   Chronic, ongoing.  Continue  current Levothyroxine  dose and adjust as needed.  Labs checked today.      Relevant Orders   TSH   T4, free     Other   Depression   Chronic, stable.  Denies SI/HI.  Continue current medication regimen and adjust as needed.        Hyperlipidemia   Chronic, ongoing.  Continue current medication regimen and adjust as needed.  Lipid panel today.      Relevant Medications   losartan  (COZAAR ) 100 MG tablet   rosuvastatin  (CRESTOR ) 10 MG tablet   Other Relevant Orders   Comprehensive metabolic panel   Lipid Panel w/o  Chol/HDL Ratio   Hyponatremia   Occasionally presents on labs.  Check today and monitor NA+ with her SSRI and Chlorthalidone  use.        Morbid obesity (HCC)   BMI 41.19.  Recommend to continue this.  Recommended eating smaller high protein, low fat meals more frequently and exercising 30 mins a day 5 times a week with a goal of 10-15lb weight loss in the next 3 months. Patient voiced their understanding and motivation to adhere to these recommendations.       Vitamin D  deficiency   Chronic, ongoing.  Taking supplement at home, continue this.  Check level today.      Relevant Orders   VITAMIN D  25 Hydroxy (Vit-D Deficiency, Fractures)     Follow up plan: Return for HTN/HLD, COPD, MOOD, THYROID .

## 2023-10-08 NOTE — Assessment & Plan Note (Signed)
 Chronic, stable.  BP at goal today in office and at goal on home checks.  Recommend she monitor BP at least a few mornings a week at home and document.  DASH diet at home.  Continue current medication regimen and adjust as needed.  Labs today:CBC, CMP, TSH

## 2023-10-08 NOTE — Assessment & Plan Note (Signed)
 Chronic, ongoing.  SOB and fatigue improving.  Continue collaboration with pulmonary and cardiology, recent notes reviewed.  Will continue current inhaler regimen as ordered.

## 2023-10-08 NOTE — Assessment & Plan Note (Signed)
 Chronic, ongoing.  Continue current Levothyroxine dose and adjust as needed.  Labs checked today.

## 2023-10-08 NOTE — Assessment & Plan Note (Signed)
 Chronic, ongoing.  Continue current medication regimen and adjust as needed. Lipid panel today.

## 2023-10-08 NOTE — Assessment & Plan Note (Signed)
 Ongoing, did not tolerate CPAP and at this time does not qualify for Insp[facility] - on review of pulmonary note.  Continue to monitor and continue collaboration with pulmonary.

## 2023-10-08 NOTE — Assessment & Plan Note (Signed)
 Chronic, stable.  Denies SI/HI.  Continue current medication regimen and adjust as needed.

## 2023-10-08 NOTE — Assessment & Plan Note (Signed)
 Occasionally presents on labs.  Check today and monitor NA+ with her SSRI and Chlorthalidone use.

## 2023-10-08 NOTE — Assessment & Plan Note (Signed)
 Chronic, ongoing.  Taking supplement at home, continue this.  Check level today.

## 2023-10-09 ENCOUNTER — Ambulatory Visit: Payer: Medicare PPO | Admitting: Emergency Medicine

## 2023-10-09 VITALS — Ht 65.0 in | Wt 255.0 lb

## 2023-10-09 DIAGNOSIS — Z0000 Encounter for general adult medical examination without abnormal findings: Secondary | ICD-10-CM | POA: Diagnosis not present

## 2023-10-09 LAB — COMPREHENSIVE METABOLIC PANEL
ALT: 10 [IU]/L (ref 0–32)
AST: 13 [IU]/L (ref 0–40)
Albumin: 4 g/dL (ref 3.8–4.8)
Alkaline Phosphatase: 79 [IU]/L (ref 44–121)
BUN/Creatinine Ratio: 28 (ref 12–28)
BUN: 19 mg/dL (ref 8–27)
Bilirubin Total: 0.9 mg/dL (ref 0.0–1.2)
CO2: 24 mmol/L (ref 20–29)
Calcium: 9.3 mg/dL (ref 8.7–10.3)
Chloride: 101 mmol/L (ref 96–106)
Creatinine, Ser: 0.67 mg/dL (ref 0.57–1.00)
Globulin, Total: 2.4 g/dL (ref 1.5–4.5)
Glucose: 103 mg/dL — ABNORMAL HIGH (ref 70–99)
Potassium: 4.3 mmol/L (ref 3.5–5.2)
Sodium: 139 mmol/L (ref 134–144)
Total Protein: 6.4 g/dL (ref 6.0–8.5)
eGFR: 92 mL/min/{1.73_m2} (ref >59)

## 2023-10-09 LAB — CBC WITH DIFFERENTIAL/PLATELET
Basophils Absolute: 0.1 10*3/uL (ref 0.0–0.2)
Basos: 1 %
EOS (ABSOLUTE): 0.2 10*3/uL (ref 0.0–0.4)
Eos: 3 %
Hematocrit: 41.1 % (ref 34.0–46.6)
Hemoglobin: 13.5 g/dL (ref 11.1–15.9)
Immature Grans (Abs): 0 10*3/uL (ref 0.0–0.1)
Immature Granulocytes: 0 %
Lymphocytes Absolute: 1.7 10*3/uL (ref 0.7–3.1)
Lymphs: 28 %
MCH: 28.3 pg (ref 26.6–33.0)
MCHC: 32.8 g/dL (ref 31.5–35.7)
MCV: 86 fL (ref 79–97)
Monocytes Absolute: 0.5 10*3/uL (ref 0.1–0.9)
Monocytes: 8 %
Neutrophils Absolute: 3.5 10*3/uL (ref 1.4–7.0)
Neutrophils: 60 %
Platelets: 225 10*3/uL (ref 150–450)
RBC: 4.77 x10E6/uL (ref 3.77–5.28)
RDW: 13.1 % (ref 11.7–15.4)
WBC: 5.9 10*3/uL (ref 3.4–10.8)

## 2023-10-09 LAB — LIPID PANEL W/O CHOL/HDL RATIO
Cholesterol, Total: 201 mg/dL — ABNORMAL HIGH (ref 100–199)
HDL: 53 mg/dL (ref >39)
LDL Chol Calc (NIH): 128 mg/dL — ABNORMAL HIGH (ref 0–99)
Triglycerides: 110 mg/dL (ref 0–149)
VLDL Cholesterol Cal: 20 mg/dL (ref 5–40)

## 2023-10-09 LAB — VITAMIN B12: Vitamin B-12: 473 pg/mL (ref 232–1245)

## 2023-10-09 LAB — TSH: TSH: 0.769 u[IU]/mL (ref 0.450–4.500)

## 2023-10-09 LAB — VITAMIN D 25 HYDROXY (VIT D DEFICIENCY, FRACTURES): Vit D, 25-Hydroxy: 31.7 ng/mL (ref 30.0–100.0)

## 2023-10-09 LAB — T4, FREE: Free T4: 1.79 ng/dL — ABNORMAL HIGH (ref 0.82–1.77)

## 2023-10-09 NOTE — Progress Notes (Signed)
 Subjective:   Tiffany Delacruz is a 73 y.o. female who presents for Medicare Annual (Subsequent) preventive examination.  Visit Complete: Virtual I connected with  Heron Rigg on 10/09/23 by a video and audio enabled telemedicine application and verified that I am speaking with the correct person using two identifiers.  Patient Location: Home  Provider Location: Office/Clinic  I discussed the limitations of evaluation and management by telemedicine. The patient expressed understanding and agreed to proceed.  Vital Signs: Because this visit was a virtual/telehealth visit, some criteria may be missing or patient reported. Any vitals not documented were not able to be obtained and vitals that have been documented are patient reported.  Patient Medicare AWV questionnaire was completed by the patient on 10/07/23; I have confirmed that all information answered by patient is correct and no changes since this date.  Cardiac Risk Factors include: advanced age (>38men, >40 women);hypertension;dyslipidemia;obesity (BMI >30kg/m2);Other (see comment), Risk factor comments: OSA (no cpap)     Objective:    Today's Vitals   10/09/23 0800  Weight: 255 lb (115.7 kg)  Height: 5' 5 (1.651 m)   Body mass index is 42.43 kg/m.     10/09/2023    8:08 AM 06/05/2022   10:14 AM 05/26/2022    9:40 AM 10/03/2020    1:22 PM 11/14/2018    9:59 AM 08/05/2018    3:12 PM 11/09/2017    8:58 AM  Advanced Directives  Does Patient Have a Medical Advance Directive? Yes Yes Yes No Yes No Yes  Type of Estate Agent of [place];Living will Healthcare Power of [place];Living will   Healthcare Power of [place];Living will  Healthcare Power of [place];Living will  Does patient want to make changes to medical advance directive? No - Patient declined No - Patient declined       Copy of Healthcare Power of Attorney in Chart? No - copy requested No - copy requested   No - copy requested  No - copy  requested    Current Medications (verified) Outpatient Encounter Medications as of 10/09/2023  Medication Sig   acetaminophen  (TYLENOL ) 650 MG CR tablet Take 1,300 mg by mouth every 8 (eight) hours as needed for pain.   albuterol  (VENTOLIN  HFA) 108 (90 Base) MCG/ACT inhaler TAKE 2 PUFFS BY MOUTH EVERY 6 HOURS AS NEEDED FOR WHEEZE OR SHORTNESS OF BREATH   b complex vitamins tablet Take 1 tablet by mouth daily.   chlorthalidone  (HYGROTON ) 25 MG tablet TAKE 1/2 TABLET BY MOUTH EVERY DAY   famotidine  (PEPCID ) 20 MG tablet Take 20 mg by mouth as needed.   FLUoxetine  (PROZAC ) 40 MG capsule TAKE 1 CAPSULE (40 MG TOTAL) BY MOUTH DAILY.   fluticasone  (FLONASE ) 50 MCG/ACT nasal spray SPRAY 2 SPRAYS INTO EACH NOSTRIL EVERY DAY   Fluticasone -Umeclidin-Vilant (TRELEGY ELLIPTA ) 200-62.5-25 MCG/ACT AEPB Inhale 1 Act into the lungs daily.   levothyroxine  (SYNTHROID ) 150 MCG tablet TAKE 1 TABLET BY MOUTH EVERY DAY   losartan  (COZAAR ) 100 MG tablet Take 1 tablet (100 mg total) by mouth daily.   Melatonin 10 MG CHEW Chew by mouth at bedtime.   nitroGLYCERIN  (NITROSTAT ) 0.4 MG SL tablet Place 1 tablet (0.4 mg total) under the tongue every 5 (five) minutes as needed for chest pain (maximum of 3 doses.).   OVER THE COUNTER MEDICATION as needed. Allergy eye drops   rosuvastatin  (CRESTOR ) 10 MG tablet Take 1 tablet (10 mg total) by mouth daily.   No facility-administered encounter medications on file as of  10/09/2023.    Allergies (verified) Requip  [ropinirole ]   History: Past Medical History:  Diagnosis Date   Anxiety    Depression    Diastolic dysfunction    a. 11/2022 Echo: EF 60-65%, no rmwa, GrI DD, nl RV fxn, mildly dil LA, mild MR, asc Ao 37mm.   GERD (gastroesophageal reflux disease)    Graves disease    Hyperlipidemia    Hypertension    Hypothyroidism    Obesity    Osteoarthritis    Precordial pain    a. 09/2022 MV: EF 71%, no isch/infarct; b. 02/2023 Cath: nl cors. Nl R heart filling  pressures. CO/CI 7.0/3.2.   Sleep apnea    a. Doesn't tolerate CPAP.   Wheezing 07/14/2018   Past Surgical History:  Procedure Laterality Date   BACK SURGERY  1988   BREAST BIOPSY Right 2017   benign, with marker   CARPAL TUNNEL RELEASE Left 07/20/2017   Procedure: CARPAL TUNNEL RELEASE;  Surgeon: Mardee Lynwood SQUIBB, MD;  Location: [facility] ORS;  Service: Orthopedics;  Laterality: Left;   CARPAL TUNNEL RELEASE Right 06/05/2022   Procedure: CARPAL TUNNEL RELEASE;  Surgeon: Mardee Lynwood SQUIBB, MD;  Location: [facility] ORS;  Service: Orthopedics;  Laterality: Right;   cataracts surgery     COLONOSCOPY     disk repair     EYE SURGERY Bilateral 10/10/2018   JOINT REPLACEMENT Right 05/2013   knee replacement   RIGHT/LEFT HEART CATH AND CORONARY ANGIOGRAPHY Bilateral 03/14/2023   Procedure: RIGHT/LEFT HEART CATH AND CORONARY ANGIOGRAPHY;  Surgeon: Mady Bruckner, MD;  Location: [facility] INVASIVE CV LAB;  Service: Cardiovascular;  Laterality: Bilateral;   TRIGGER FINGER RELEASE Left 2023   Family History  Problem Relation Age of Onset   Uterine cancer Mother    Colon cancer Mother    Heart disease Father    Alcohol abuse Father    Heart attack Father 51   Rheum arthritis Sister    Diabetes Maternal Grandmother    Rheum arthritis Brother    Social History   Socioeconomic History   Marital status: Widowed    Spouse name: Not on file   Number of children: 1   Years of education: Not on file   Highest education level: Associate degree: academic program  Occupational History   Occupation: retired  Tobacco Use   Smoking status: Never   Smokeless tobacco: Never  Vaping Use   Vaping status: Never Used  Substance and Sexual Activity   Alcohol use: No    Alcohol/week: 0.0 standard drinks of alcohol   Drug use: No   Sexual activity: Never  Other Topics Concern   Not on file  Social History Narrative   Not on file   Social Drivers of Health   Financial Resource Strain: Low Risk  (10/09/2023)    Overall Financial Resource Strain (CARDIA)    Difficulty of Paying Living Expenses: Not hard at all  Food Insecurity: No Food Insecurity (10/09/2023)   Hunger Vital Sign    Worried About Running Out of Food in the Last Year: Never true    Ran Out of Food in the Last Year: Never true  Transportation Needs: No Transportation Needs (10/09/2023)   PRAPARE - Administrator, Civil Service (Medical): No    Lack of Transportation (Non-Medical): No  Physical Activity: Inactive (10/09/2023)   Exercise Vital Sign    Days of Exercise per Week: 0 days    Minutes of Exercise per Session: 0 min  Stress: No  Stress Concern Present (10/07/2023)   [employer] of Occupational Health - Occupational Stress Questionnaire    Feeling of Stress : Not at all  Social Connections: Moderately Integrated (10/07/2023)   Social Connection and Isolation Panel [NHANES]    Frequency of Communication with Friends and Family: More than three times a week    Frequency of Social Gatherings with Friends and Family: More than three times a week    Attends Religious Services: More than 4 times per year    Active Member of [employer] or Organizations: Yes    Attends Banker Meetings: More than 4 times per year    Marital Status: Widowed    Tobacco Counseling Counseling given: Not Answered   Clinical Intake:  Pre-visit preparation completed: Yes  Pain : No/denies pain     BMI - recorded: 42.43 Nutritional Status: BMI > 30  Obese Nutritional Risks: None Diabetes: No  How often do you need to have someone help you when you read instructions, pamphlets, or other written materials from your doctor or pharmacy?: 1 - Never  Interpreter Needed?: No  Information entered by :: Vina Ned, CMA   Activities of Daily Living    10/09/2023    8:02 AM 10/07/2023    9:49 PM  In your present state of health, do you have any difficulty performing the following activities:  Hearing? 0 0  Vision? 0 0   Difficulty concentrating or making decisions? 0 0  Walking or climbing stairs? 0 0  Dressing or bathing? 0 0  Doing errands, shopping? 0 0  Preparing Food and eating ? N N  Using the Toilet? N N  In the past six months, have you accidently leaked urine? Y Y  Comment no pad or depends   Do you have problems with loss of bowel control? N N  Managing your Medications? N N  Managing your Finances? N N  Housekeeping or managing your Housekeeping? N N    Patient Care Team: Cannady, Jolene T, NP as PCP - General (Nurse Practitioner) Daphane Rosella, NP (Inactive) as PCP - Family Medicine (Nurse Practitioner) End, Lonni, MD as PCP - Cardiology (Cardiology)  Indicate any recent Medical Services you may have received from other than [facility] providers in the past year (date may be approximate).     Assessment:   This is a routine wellness examination for [place].  Hearing/Vision screen Hearing Screening - Comments:: No hearing loss Vision Screening - Comments:: Gets eye exams   Goals Addressed             This Visit's Progress    Patient Stated       Lose weight, don't gain any weight      Depression Screen    10/09/2023    8:06 AM 10/08/2023    9:24 AM 04/03/2023    2:09 PM 01/12/2023    4:04 PM 10/04/2022   10:22 AM 04/03/2022    9:37 AM 08/26/2021    8:59 AM  PHQ 2/9 Scores  PHQ - 2 Score 0 0 0 0 1 4 0  PHQ- 9 Score 0 0 3 2 5 6  0    Fall Risk    10/09/2023    8:09 AM 10/08/2023    9:20 AM 10/07/2023    9:49 PM 05/01/2023    2:45 PM 04/03/2023    2:08 PM  Fall Risk   Falls in the past year? 0 0 0 0 0  Number falls in past yr: 0  0  0 0  Injury with Fall? 0 0  0 0  Risk for fall due to : No Fall Risks No Fall Risks  No Fall Risks No Fall Risks  Follow up Falls prevention discussed Falls evaluation completed  Falls evaluation completed Falls evaluation completed    MEDICARE RISK AT HOME: Medicare Risk at Home Any stairs in or around the home?: (Patient-Rptd) Yes If  so, are there any without handrails?: (Patient-Rptd) No Home free of loose throw rugs in walkways, pet beds, electrical cords, etc?: (Patient-Rptd) Yes Adequate lighting in your home to reduce risk of falls?: (Patient-Rptd) Yes Life alert?: (Patient-Rptd) No Use of a cane, walker or w/c?: (Patient-Rptd) No Grab bars in the bathroom?: (Patient-Rptd) No Shower chair or bench in shower?: (Patient-Rptd) No Elevated toilet seat or a handicapped toilet?: (Patient-Rptd) No  TIMED UP AND GO:  Was the test performed?  No    Cognitive Function:        10/09/2023    8:09 AM 10/04/2022   10:29 AM 11/14/2018   10:00 AM 11/09/2017    9:01 AM  6CIT Screen  What Year? 0 points 0 points 0 points 0 points  What month? 0 points 0 points 0 points 0 points  What time? 0 points 0 points 0 points 0 points  Count back from 20 0 points 0 points 0 points 0 points  Months in reverse 2 points 0 points 0 points 0 points  Repeat phrase 0 points 0 points 0 points 0 points  Total Score 2 points 0 points 0 points 0 points    Immunizations Immunization History  Administered Date(s) Administered   Fluad Quad(high Dose 65+) 06/27/2019, 10/28/2020, 08/26/2021, 10/04/2022   Influenza, High Dose Seasonal PF 07/17/2016   Influenza,inj,Quad PF,6+ Mos 10/20/2015   Influenza-Unspecified 07/09/2014   PFIZER(Purple Top)SARS-COV-2 Vaccination 05/30/2020, 06/20/2020   Pneumococcal Conjugate-13 10/20/2015   Pneumococcal Polysaccharide-23 06/24/2012, 11/09/2017   Td 09/25/2001   Tdap 12/23/2012    TDAP status: Due, Education has been provided regarding the importance of this vaccine. Advised may receive this vaccine at local pharmacy or Health Dept. Aware to provide a copy of the vaccination record if obtained from local pharmacy or Health Dept. Verbalized acceptance and understanding.  Flu Vaccine status: Up to date  Pneumococcal vaccine status: Up to date  Covid-19 vaccine status: Declined, Education has been  provided regarding the importance of this vaccine but patient still declined. Advised may receive this vaccine at local pharmacy or Health Dept.or vaccine clinic. Aware to provide a copy of the vaccination record if obtained from local pharmacy or Health Dept. Verbalized acceptance and understanding.  Qualifies for Shingles Vaccine? Yes   Zostavax completed Yes   Shingrix Completed?: No.    Education has been provided regarding the importance of this vaccine. Patient has been advised to call insurance company to determine out of pocket expense if they have not yet received this vaccine. Advised may also receive vaccine at local pharmacy or Health Dept. Verbalized acceptance and understanding.Patient declined.  Screening Tests Health Maintenance  Topic Date Due   INFLUENZA VACCINE  12/24/2023 (Originally 04/26/2023)   Zoster Vaccines- Shingrix (1 of 2) 01/06/2024 (Originally 04/25/1999)   DTaP/Tdap/Td (3 - Td or Tdap) 10/07/2024 (Originally 12/24/2022)   MAMMOGRAM  10/07/2024 (Originally 09/28/2021)   DEXA SCAN  10/07/2024 (Originally 04/24/2014)   Colonoscopy  10/07/2024 (Originally 08/29/2022)   Medicare Annual Wellness (AWV)  10/08/2024   Pneumonia Vaccine 57+ Years old  Completed   COVID-19  Vaccine  Completed   Hepatitis C Screening  Completed   HPV VACCINES  Aged Out    Health Maintenance  There are no preventive care reminders to display for this patient.   Colon Cancer Screening: declined  Mammogram Status: declined  Bone Density Status: declined  Lung Cancer Screening: (Low Dose CT Chest recommended if Age 87-80 years, 20 pack-year currently smoking OR have quit w/in 15years.) does not qualify.   Lung Cancer Screening Referral: n/a  Additional Screening:  Hepatitis C Screening: does not qualify; Completed 10/20/15  Vision Screening: Recommended annual ophthalmology exams for early detection of glaucoma and other disorders of the eye.  Dental Screening: Recommended annual  dental exams for proper oral hygiene   Community Resource Referral / Chronic Care Management: CRR required this visit?  No   CCM required this visit?  No     Plan:     I have personally reviewed and noted the following in the patient's chart:   Medical and social history Use of alcohol, tobacco or illicit drugs  Current medications and supplements including opioid prescriptions. Patient is not currently taking opioid prescriptions. Functional ability and status Nutritional status Physical activity Advanced directives List of other physicians Hospitalizations, surgeries, and ER visits in previous 12 months Vitals Screenings to include cognitive, depression, and falls Referrals and appointments  In addition, I have reviewed and discussed with patient certain preventive protocols, quality metrics, and best practice recommendations. A written personalized care plan for preventive services as well as general preventive health recommendations were provided to patient.     Vina Ned, CMA   10/09/2023   After Visit Summary: (MyChart) Due to this being a telephonic visit, the after visit summary with patients personalized plan was offered to patient via MyChart   Nurse Notes:  Needs Tdap Declined Covid and shingles vaccines Declined MMG and DEXA scan

## 2023-10-09 NOTE — Progress Notes (Signed)
 Contacted via MyChart   Good day Cy, your labs have returned: - Kidney function, creatinine and eGFR, remains normal, as is liver function, AST and ALT.  - CBC shows no anemia or infection. - Thyroid  shows normal TSH and mild elevation Free T4, continue current Levothyroxine  dosing. - Lipid panel is showing elevations, are you taking Rosuvastatin  daily? Please let me know because if you are we will need to increase dose. - Remainder of labs normal.  Any questions? Keep being amazing!!  Thank you for allowing me to participate in your care.  I appreciate you. Kindest regards, Jessi Jessop

## 2023-10-09 NOTE — Patient Instructions (Signed)
 Tiffany Delacruz , Thank you for taking time to come for your Medicare Wellness Visit. I appreciate your ongoing commitment to your health goals. Please review the following plan we discussed and let me know if I can assist you in the future.   Referrals/Orders/Follow-Ups/Clinician Recommendations: Get a tetanus shot at your earliest convenience.  This is a list of the screening recommended for you and due dates:  Health Maintenance  Topic Date Due   Flu Shot  12/24/2023*   Zoster (Shingles) Vaccine (1 of 2) 01/06/2024*   DTaP/Tdap/Td vaccine (3 - Td or Tdap) 10/07/2024*   Mammogram  10/07/2024*   DEXA scan (bone density measurement)  10/07/2024*   Colon Cancer Screening  10/07/2024*   Medicare Annual Wellness Visit  10/08/2024   Pneumonia Vaccine  Completed   COVID-19 Vaccine  Completed   Hepatitis C Screening  Completed   HPV Vaccine  Aged Out  *Topic was postponed. The date shown is not the original due date.    Advanced directives: (Copy Requested) Please bring a copy of your health care power of attorney and living will to the office to be added to your chart at your convenience.  Next Medicare Annual Wellness Visit scheduled for next year: Yes, 10/14/24 @ 8:00am (video visit)

## 2023-10-12 ENCOUNTER — Encounter: Payer: Self-pay | Admitting: Nurse Practitioner

## 2023-10-14 ENCOUNTER — Other Ambulatory Visit: Payer: Self-pay | Admitting: Nurse Practitioner

## 2023-10-14 DIAGNOSIS — J3089 Other allergic rhinitis: Secondary | ICD-10-CM

## 2023-10-15 NOTE — Telephone Encounter (Signed)
Requested Prescriptions  Pending Prescriptions Disp Refills   fluticasone (FLONASE) 50 MCG/ACT nasal spray [Pharmacy Med Name: FLUTICASONE PROP 50 MCG SPRAY] 48 mL 1    Sig: SPRAY 2 SPRAYS INTO EACH NOSTRIL EVERY DAY     Ear, Nose, and Throat: Nasal Preparations - Corticosteroids Passed - 10/15/2023  2:14 PM      Passed - Valid encounter within last 12 months    Recent Outpatient Visits           1 week ago Stable angina (HCC)   [place] [facility] [place], [place] T, NP   5 months ago Essential hypertension   [place] [facility] [place], [place] T, NP   6 months ago Chronic obstructive pulmonary disease, unspecified COPD type (HCC)   [place] [facility] [place], Dorie Rank, NP   9 months ago Essential hypertension   [place] [facility] [place], Corrie Dandy T, NP   1 year ago Medicare annual wellness visit, subsequent    [facility] [place], Dorie Rank, NP

## 2023-10-16 DIAGNOSIS — J449 Chronic obstructive pulmonary disease, unspecified: Secondary | ICD-10-CM | POA: Diagnosis not present

## 2023-11-16 DIAGNOSIS — J449 Chronic obstructive pulmonary disease, unspecified: Secondary | ICD-10-CM | POA: Diagnosis not present

## 2023-12-15 ENCOUNTER — Other Ambulatory Visit: Payer: Self-pay | Admitting: Nurse Practitioner

## 2023-12-17 NOTE — Telephone Encounter (Signed)
 Requested Prescriptions  Pending Prescriptions Disp Refills   albuterol (VENTOLIN HFA) 108 (90 Base) MCG/ACT inhaler [Pharmacy Med Name: ALBUTEROL HFA (PROAIR) INHALER] 17 each 0    Sig: TAKE 2 PUFFS BY MOUTH EVERY 6 HOURS AS NEEDED FOR WHEEZE OR SHORTNESS OF BREATH     Pulmonology:  Beta Agonists 2 Passed - 12/17/2023  2:43 PM      Passed - Last BP in normal range    BP Readings from Last 1 Encounters:  10/08/23 132/78         Passed - Last Heart Rate in normal range    Pulse Readings from Last 1 Encounters:  10/08/23 78         Passed - Valid encounter within last 12 months    Recent Outpatient Visits           2 months ago Stable angina (HCC)   [place] [facility] [place], [place] T, NP   7 months ago Essential hypertension   [place] [facility] [place], [place] T, NP   8 months ago Chronic obstructive pulmonary disease, unspecified COPD type (HCC)   [place] [facility] [place], Dorie Rank, NP   11 months ago Essential hypertension   [place] [facility] [place], Corrie Dandy T, NP   1 year ago Medicare annual wellness visit, subsequent    [facility] [place], Dorie Rank, NP

## 2023-12-28 ENCOUNTER — Other Ambulatory Visit: Payer: Self-pay | Admitting: Nurse Practitioner

## 2024-01-04 ENCOUNTER — Telehealth: Payer: Self-pay | Admitting: Nurse Practitioner

## 2024-01-17 ENCOUNTER — Other Ambulatory Visit: Payer: Self-pay | Admitting: Nurse Practitioner

## 2024-01-17 NOTE — Telephone Encounter (Signed)
 Requested Prescriptions  Pending Prescriptions Disp Refills   albuterol  (VENTOLIN  HFA) 108 (90 Base) MCG/ACT inhaler [Pharmacy Med Name: ALBUTEROL  HFA (PROAIR ) INHALER] 17 each 0    Sig: TAKE 2 PUFFS BY MOUTH EVERY 6 HOURS AS NEEDED FOR WHEEZE OR SHORTNESS OF BREATH     Pulmonology:  Beta Agonists 2 Failed - 01/17/2024  5:30 PM      Failed - Valid encounter within last 12 months    Recent Outpatient Visits   None            Passed - Last BP in normal range    BP Readings from Last 1 Encounters:  10/08/23 132/78         Passed - Last Heart Rate in normal range    Pulse Readings from Last 1 Encounters:  10/08/23 78

## 2024-02-07 NOTE — Telephone Encounter (Signed)
 error

## 2024-06-19 ENCOUNTER — Other Ambulatory Visit: Payer: Self-pay | Admitting: Nurse Practitioner

## 2024-06-20 NOTE — Telephone Encounter (Signed)
 LOV 10/01/23. OFFICE VISIT NEEDED FOR ADDITIONAL REFILLS,Courtesy refill given.  Requested Prescriptions  Pending Prescriptions Disp Refills   levothyroxine  (SYNTHROID ) 150 MCG tablet [Pharmacy Med Name: LEVOTHYROXINE  150 MCG TABLET] 90 tablet 2    Sig: TAKE 1 TABLET BY MOUTH EVERY DAY     Endocrinology:  Hypothyroid Agents Failed - 06/20/2024 11:00 AM      Failed - Valid encounter within last 12 months    Recent Outpatient Visits   None            Passed - TSH in normal range and within 360 days    TSH  Date Value Ref Range Status  10/08/2023 0.769 0.450 - 4.500 uIU/mL Final

## 2024-06-24 ENCOUNTER — Other Ambulatory Visit: Payer: Self-pay | Admitting: Nurse Practitioner

## 2024-06-24 DIAGNOSIS — J3089 Other allergic rhinitis: Secondary | ICD-10-CM

## 2024-06-26 NOTE — Telephone Encounter (Signed)
 Requested Prescriptions  Pending Prescriptions Disp Refills   fluticasone  (FLONASE ) 50 MCG/ACT nasal spray [Pharmacy Med Name: FLUTICASONE  PROP 50 MCG SPRAY] 48 mL 0    Sig: SPRAY 2 SPRAYS INTO EACH NOSTRIL EVERY DAY     Ear, Nose, and Throat: Nasal Preparations - Corticosteroids Failed - 06/26/2024  1:48 PM      Failed - Valid encounter within last 12 months    Recent Outpatient Visits   None

## 2024-07-06 ENCOUNTER — Other Ambulatory Visit: Payer: Self-pay | Admitting: Nurse Practitioner

## 2024-07-08 NOTE — Telephone Encounter (Signed)
 Per last notes 12/28/23- patient due f/u 7/25 Requested Prescriptions  Pending Prescriptions Disp Refills   chlorthalidone  (HYGROTON ) 25 MG tablet [Pharmacy Med Name: CHLORTHALIDONE  25 MG TABLET] 45 tablet 1    Sig: TAKE 1/2 TABLET BY MOUTH DAILY     Cardiovascular: Diuretics - Thiazide Failed - 07/08/2024  2:10 PM      Failed - Cr in normal range and within 180 days    Creatinine  Date Value Ref Range Status  06/18/2013 0.96 0.60 - 1.30 mg/dL Final   Creatinine, Ser  Date Value Ref Range Status  10/08/2023 0.67 0.57 - 1.00 mg/dL Final         Failed - K in normal range and within 180 days    Potassium  Date Value Ref Range Status  10/08/2023 4.3 3.5 - 5.2 mmol/L Final  06/18/2013 3.6 3.5 - 5.1 mmol/L Final         Failed - Na in normal range and within 180 days    Sodium  Date Value Ref Range Status  10/08/2023 139 134 - 144 mmol/L Final  06/18/2013 132 (L) 136 - 145 mmol/L Final         Failed - Valid encounter within last 6 months    Recent Outpatient Visits   None            Passed - Last BP in normal range    BP Readings from Last 1 Encounters:  10/08/23 132/78

## 2024-07-23 ENCOUNTER — Other Ambulatory Visit: Payer: Self-pay | Admitting: Nurse Practitioner

## 2024-07-25 NOTE — Telephone Encounter (Signed)
 Unable to refill per protocol, courtesy refill already given, OV needed.  Requested Prescriptions  Pending Prescriptions Disp Refills   levothyroxine  (SYNTHROID ) 150 MCG tablet [Pharmacy Med Name: LEVOTHYROXINE  150 MCG TABLET] 90 tablet 1    Sig: TAKE 1 TABLET BY MOUTH EVERY DAY     Endocrinology:  Hypothyroid Agents Failed - 07/25/2024  9:18 AM      Failed - Valid encounter within last 12 months    Recent Outpatient Visits   None            Passed - TSH in normal range and within 360 days    TSH  Date Value Ref Range Status  10/08/2023 0.769 0.450 - 4.500 uIU/mL Final

## 2024-07-28 ENCOUNTER — Other Ambulatory Visit: Payer: Self-pay | Admitting: Nurse Practitioner

## 2024-07-29 NOTE — Telephone Encounter (Signed)
 Requested Prescriptions  Pending Prescriptions Disp Refills   levothyroxine  (SYNTHROID ) 150 MCG tablet [Pharmacy Med Name: LEVOTHYROXINE  150 MCG TABLET] 30 tablet 0    Sig: TAKE 1 TABLET BY MOUTH EVERY DAY     Endocrinology:  Hypothyroid Agents Failed - 07/29/2024  3:24 PM      Failed - Valid encounter within last 12 months    Recent Outpatient Visits   None            Passed - TSH in normal range and within 360 days    TSH  Date Value Ref Range Status  10/08/2023 0.769 0.450 - 4.500 uIU/mL Final

## 2024-08-10 ENCOUNTER — Other Ambulatory Visit: Payer: Self-pay | Admitting: Nurse Practitioner

## 2024-08-12 ENCOUNTER — Other Ambulatory Visit

## 2024-08-12 NOTE — Telephone Encounter (Signed)
 Medication no longer listed on current medication list Requested Prescriptions  Pending Prescriptions Disp Refills   MYRBETRIQ  50 MG TB24 tablet [Pharmacy Med Name: MYRBETRIQ  ER 50 MG TABLET] 90 tablet 4    Sig: TAKE 1 TABLET BY MOUTH EVERY DAY     Urology: Bladder Agents - mirabegron  Failed - 08/12/2024  3:09 PM      Failed - Valid encounter within last 12 months    Recent Outpatient Visits   None            Passed - Cr in normal range and within 360 days    Creatinine  Date Value Ref Range Status  06/18/2013 0.96 0.60 - 1.30 mg/dL Final   Creatinine, Ser  Date Value Ref Range Status  10/08/2023 0.67 0.57 - 1.00 mg/dL Final         Passed - ALT in normal range and within 360 days    ALT  Date Value Ref Range Status  10/08/2023 10 0 - 32 IU/L Final   SGPT (ALT)  Date Value Ref Range Status  06/23/2012 16 12 - 78 U/L Final         Passed - AST in normal range and within 360 days    AST  Date Value Ref Range Status  10/08/2023 13 0 - 40 IU/L Final   SGOT(AST)  Date Value Ref Range Status  06/23/2012 12 (L) 15 - 37 Unit/L Final         Passed - eGFR is 15 or above and within 360 days    EGFR (African American)  Date Value Ref Range Status  06/18/2013 >60  Final   GFR calc Af Amer  Date Value Ref Range Status  10/28/2020 78 >59 mL/min/1.73 Final    Comment:    **In accordance with recommendations from the NKF-ASN Task force,**   [facility] is in the process of updating its eGFR calculation to the   2021 CKD-EPI creatinine equation that estimates kidney function   without a race variable.    EGFR (Non-African Amer.)  Date Value Ref Range Status  06/18/2013 >60  Final    Comment:    eGFR values <60mL/min/1.73 m2 may be an indication of chronic kidney disease (CKD). Calculated eGFR is useful in patients with stable renal function. The eGFR calculation will not be reliable in acutely ill patients when serum creatinine is changing rapidly. It is not useful in   patients on dialysis. The eGFR calculation may not be applicable to patients at the low and high extremes of body sizes, pregnant women, and vegetarians.    GFR, Estimated  Date Value Ref Range Status  03/08/2023 >60 >60 mL/min Final    Comment:    (NOTE) Calculated using the CKD-EPI Creatinine Equation (2021)    eGFR  Date Value Ref Range Status  10/08/2023 92 >59 mL/min/1.73 Final         Passed - Last BP in normal range    BP Readings from Last 1 Encounters:  10/08/23 132/78

## 2024-08-15 ENCOUNTER — Encounter: Payer: Self-pay | Admitting: Nurse Practitioner

## 2024-08-15 ENCOUNTER — Ambulatory Visit: Admitting: Nurse Practitioner

## 2024-08-15 DIAGNOSIS — E782 Mixed hyperlipidemia: Secondary | ICD-10-CM | POA: Diagnosis not present

## 2024-08-15 DIAGNOSIS — J4530 Mild persistent asthma, uncomplicated: Secondary | ICD-10-CM

## 2024-08-15 DIAGNOSIS — F3342 Major depressive disorder, recurrent, in full remission: Secondary | ICD-10-CM

## 2024-08-15 DIAGNOSIS — I10 Essential (primary) hypertension: Secondary | ICD-10-CM

## 2024-08-15 DIAGNOSIS — I2089 Other forms of angina pectoris: Secondary | ICD-10-CM | POA: Diagnosis not present

## 2024-08-15 DIAGNOSIS — E039 Hypothyroidism, unspecified: Secondary | ICD-10-CM

## 2024-08-15 DIAGNOSIS — G4733 Obstructive sleep apnea (adult) (pediatric): Secondary | ICD-10-CM

## 2024-08-15 MED ORDER — ALBUTEROL SULFATE HFA 108 (90 BASE) MCG/ACT IN AERS: INHALATION_SPRAY | RESPIRATORY_TRACT | 1 refills | Status: AC

## 2024-08-15 MED ORDER — TRELEGY ELLIPTA 200-62.5-25 MCG/ACT IN AEPB: 1.0000 | INHALATION_SPRAY | Freq: Every day | RESPIRATORY_TRACT | 5 refills | Status: AC

## 2024-08-15 MED ORDER — MIRABEGRON ER 50 MG PO TB24: 50.0000 mg | ORAL_TABLET | Freq: Every day | ORAL | 3 refills | Status: AC

## 2024-08-15 MED ORDER — FLUOXETINE HCL 40 MG PO CAPS: 40.0000 mg | ORAL_CAPSULE | Freq: Every day | ORAL | 4 refills | Status: AC

## 2024-08-15 MED ORDER — FLUTICASONE PROPIONATE 50 MCG/ACT NA SUSP: NASAL | 3 refills | Status: AC

## 2024-08-15 MED ORDER — LOSARTAN POTASSIUM 100 MG PO TABS: 100.0000 mg | ORAL_TABLET | Freq: Every day | ORAL | 4 refills | Status: AC

## 2024-08-15 MED ORDER — LEVOTHYROXINE SODIUM 150 MCG PO TABS
150.0000 ug | ORAL_TABLET | Freq: Every day | ORAL | 3 refills | Status: AC
Start: 2024-08-15 — End: ?

## 2024-08-15 NOTE — Assessment & Plan Note (Signed)
 Chronic and stable with no recent CP or NTG use.  Continue collaboration with cardiology as needed.  Recent notes and testing reviewed.

## 2024-08-15 NOTE — Assessment & Plan Note (Signed)
 Ongoing, did not tolerate CPAP and at this time does not qualify for Insp[facility] - on review of pulmonary note.  Continue to monitor and continue collaboration with pulmonary.

## 2024-08-15 NOTE — Assessment & Plan Note (Signed)
Chronic, ongoing.  Continue current medication regimen and adjust as needed.  Lipid panel next visit.    

## 2024-08-15 NOTE — Assessment & Plan Note (Signed)
 Chronic, stable.  Denies SI/HI.  Continue current medication regimen and adjust as needed.

## 2024-08-15 NOTE — Progress Notes (Signed)
 BP 119/79 (BP Location: Left Arm, Patient Position: Sitting, Cuff Size: Large)   Pulse 70   Temp 97.9 F (36.6 C) (Oral)   Resp 18   Ht 5' 5 (1.651 m)   Wt 247 lb (112 kg)   LMP 12/15/1991 (Approximate)   SpO2 96%   BMI 41.10 kg/m    Subjective:    Patient ID: Tiffany Delacruz, female    DOB: [birth year], 73 y.o.   MRN: [id number]  HPI: Tiffany Delacruz is a 73 y.o. female  Chief Complaint  Patient presents with   Medication Refill    All pended for provider review.    HYPERTENSION / HYPERLIPIDEMIA Taking Losartan  and Rosuvastatin . Has not taken Chlorthalidone  in one year. Cardiology last seen 04/06/23 with heart cath performed 03/14/23.  Pulmonary last 07/18/23, had OSA testing which was positive but did not tolerate CPAP and per note she does not qualify for Inspire.  Satisfied with current treatment? yes Duration of hypertension: chronic BP monitoring frequency: occasional BP range: <130/80 BP medication side effects: no Duration of hyperlipidemia: chronic Aspirin : no Recent stressors: no Recurrent headaches: no Visual changes: no Palpitations: no Dyspnea: improving Chest pain: no Lower extremity edema: no Dizzy/lightheaded: no The 10-year ASCVD risk score (Arnett DK, et al., 2019) is: 18.5%   Values used to calculate the score:     Age: 49 years     Clincally relevant sex: Female     Is Non-Hispanic African American: No     Diabetic: No     Tobacco smoker: No     Systolic Blood Pressure: 119 mmHg     Is BP treated: Yes     HDL Cholesterol: 53 mg/dL     Total Cholesterol: 201 mg/dL  HYPOTHYROIDISM Continues on Levothyroxine  150 MCG.  Thyroid  control status:stable Satisfied with current treatment? yes Medication side effects: no Medication compliance: good compliance Etiology of hypothyroidism:  Recent dose adjustment: none Fatigue: no Cold intolerance: no Heat intolerance: no Weight gain: no Weight loss: no Constipation: a bit better Diarrhea/loose  stools: no Palpitations: no Lower extremity edema: no Anxiety/depressed mood: no  REACTIVE AIRWAY Is using Trelegy inhaler + Albuterol . Aortic atherosclerosis noted on past imaging.  COPD status: stable Satisfied with current treatment?: yes Oxygen  use: no Dyspnea frequency: none Cough frequency: none Rescue inhaler frequency:  twice a week Limitation of activity: no Productive cough: none Last Spirometry: with pulmonary Pneumovax: Up to Date Influenza: Not up to Date    DEPRESSION Continues on Prozac  daily.  Continues Vitamin D  daily for history of low levels. Mood status: stable Satisfied with current treatment?: yes Symptom severity: moderate  Duration of current treatment : chronic Side effects: no Medication compliance: good compliance Depressed mood: no Anxious mood: no Anhedonia: no Significant weight loss or gain: no Insomnia: no Fatigue: seems to be improving Feelings of worthlessness or guilt: no Impaired concentration/indecisiveness: no Suicidal ideations: no Hopelessness: no Crying spells: no    08/15/2024   11:10 AM 10/09/2023    8:06 AM 10/08/2023    9:24 AM 04/03/2023    2:09 PM 01/12/2023    4:04 PM  Depression screen PHQ 2/9  Decreased Interest 0 0 0 0 0  Down, Depressed, Hopeless 0 0 0 0 0  PHQ - 2 Score 0 0 0 0 0  Altered sleeping 0 0 0 0 0  Tired, decreased energy 0 0 0 3 0  Change in appetite 0 0 0 0 2  Feeling bad or failure about yourself  0 0 0 0 0  Trouble concentrating 0 0 0 0 0  Moving slowly or fidgety/restless 0 0 0 0 0  Suicidal thoughts 0 0 0 0 0  PHQ-9 Score 0 0  0  3  2   Difficult doing work/chores  Not difficult at all Not difficult at all Extremely dIfficult Somewhat difficult     Data saved with a previous flowsheet row definition       08/15/2024   11:10 AM 10/08/2023    9:24 AM 04/03/2023    2:09 PM 01/12/2023    4:04 PM  GAD 7 : Generalized Anxiety Score  Nervous, Anxious, on Edge 0 0 0 0  Control/stop worrying 0 0  0 0  Worry too much - different things 0 0 0 0  Trouble relaxing 0 0 0 0  Restless 0 0 0 0  Easily annoyed or irritable 0 0 0 0  Afraid - awful might happen 0 0 0 0  Total GAD 7 Score 0 0 0 0  Anxiety Difficulty  Not difficult at all Not difficult at all Not difficult at all   Relevant past medical, surgical, family and social history reviewed and updated as indicated. Interim medical history since our last visit reviewed. Allergies and medications reviewed and updated.  Review of Systems  Constitutional:  Negative for activity change, appetite change, diaphoresis, fatigue and fever.  Respiratory:  Negative for cough, chest tightness, shortness of breath and wheezing.   Cardiovascular:  Negative for chest pain, palpitations and leg swelling.  Gastrointestinal: Negative.   Endocrine: Negative for cold intolerance and heat intolerance.  Genitourinary: Negative.   Neurological: Negative.   Psychiatric/Behavioral: Negative.      Per HPI unless specifically indicated above     Objective:    BP 119/79 (BP Location: Left Arm, Patient Position: Sitting, Cuff Size: Large)   Pulse 70   Temp 97.9 F (36.6 C) (Oral)   Resp 18   Ht 5' 5 (1.651 m)   Wt 247 lb (112 kg)   LMP 12/15/1991 (Approximate)   SpO2 96%   BMI 41.10 kg/m   Wt Readings from Last 3 Encounters:  08/15/24 247 lb (112 kg)  10/09/23 255 lb (115.7 kg)  10/08/23 255 lb 3.2 oz (115.8 kg)    Physical Exam Vitals and nursing note reviewed.  Constitutional:      General: She is awake. She is not in acute distress.    Appearance: She is well-developed and well-groomed. She is obese. She is not ill-appearing.  HENT:     Head: Normocephalic.     Right Ear: Hearing normal. No drainage.     Left Ear: Hearing normal. No drainage.  Eyes:     General: Lids are normal.        Right eye: No discharge.        Left eye: No discharge.     Conjunctiva/sclera: Conjunctivae normal.     Pupils: Pupils are equal, round, and  reactive to light.  Neck:     Thyroid : No thyromegaly.     Vascular: No carotid bruit.  Cardiovascular:     Rate and Rhythm: Normal rate and regular rhythm.     Heart sounds: Normal heart sounds. No murmur heard.    No gallop.  Pulmonary:     Effort: Pulmonary effort is normal. No accessory muscle usage or respiratory distress.     Breath sounds: Normal breath sounds.  Abdominal:     General: Bowel sounds are  normal. There is no distension.     Palpations: Abdomen is soft.     Tenderness: There is no abdominal tenderness.  Musculoskeletal:     Cervical back: Normal range of motion and neck supple.     Right lower leg: No edema.     Left lower leg: No edema.  Lymphadenopathy:     Head:     Right side of head: No submental, submandibular, tonsillar, preauricular or posterior auricular adenopathy.     Left side of head: No submental, submandibular, tonsillar, preauricular or posterior auricular adenopathy.     Cervical: No cervical adenopathy.  Skin:    General: Skin is warm and dry.  Neurological:     Mental Status: She is alert and oriented to person, place, and time.     Gait: Gait is intact.     Deep Tendon Reflexes: Reflexes are normal and symmetric.     Reflex Scores:      Brachioradialis reflexes are 2+ on the right side and 2+ on the left side.      Patellar reflexes are 2+ on the right side and 2+ on the left side. Psychiatric:        Attention and Perception: Attention normal.        Mood and Affect: Mood normal.        Speech: Speech normal.        Behavior: Behavior normal. Behavior is cooperative.        Thought Content: Thought content normal.        Judgment: Judgment normal.    Results for orders placed or performed in visit on 10/08/23  CBC with Differential/Platelet   Collection Time: 10/08/23  9:39 AM  Result Value Ref Range   WBC 5.9 3.4 - 10.8 x10E3/uL   RBC 4.77 3.77 - 5.28 x10E6/uL   Hemoglobin 13.5 11.1 - 15.9 g/dL   Hematocrit 58.8 65.9 - 46.6 %    MCV 86 79 - 97 fL   MCH 28.3 26.6 - 33.0 pg   MCHC 32.8 31.5 - 35.7 g/dL   RDW 86.8 88.2 - 84.5 %   Platelets 225 150 - 450 x10E3/uL   Neutrophils 60 Not Estab. %   Lymphs 28 Not Estab. %   Monocytes 8 Not Estab. %   Eos 3 Not Estab. %   Basos 1 Not Estab. %   Neutrophils Absolute 3.5 1.4 - 7.0 x10E3/uL   Lymphocytes Absolute 1.7 0.7 - 3.1 x10E3/uL   Monocytes Absolute 0.5 0.1 - 0.9 x10E3/uL   EOS (ABSOLUTE) 0.2 0.0 - 0.4 x10E3/uL   Basophils Absolute 0.1 0.0 - 0.2 x10E3/uL   Immature Granulocytes 0 Not Estab. %   Immature Grans (Abs) 0.0 0.0 - 0.1 x10E3/uL  Comprehensive metabolic panel   Collection Time: 10/08/23  9:39 AM  Result Value Ref Range   Glucose 103 (H) 70 - 99 mg/dL   BUN 19 8 - 27 mg/dL   Creatinine, Ser 9.32 0.57 - 1.00 mg/dL   eGFR 92 >40 fO/fpw/8.26   BUN/Creatinine Ratio 28 12 - 28   Sodium 139 134 - 144 mmol/L   Potassium 4.3 3.5 - 5.2 mmol/L   Chloride 101 96 - 106 mmol/L   CO2 24 20 - 29 mmol/L   Calcium  9.3 8.7 - 10.3 mg/dL   Total Protein 6.4 6.0 - 8.5 g/dL   Albumin 4.0 3.8 - 4.8 g/dL   Globulin, Total 2.4 1.5 - 4.5 g/dL   Bilirubin Total 0.9 0.0 - 1.2  mg/dL   Alkaline Phosphatase 79 44 - 121 IU/L   AST 13 0 - 40 IU/L   ALT 10 0 - 32 IU/L  TSH   Collection Time: 10/08/23  9:39 AM  Result Value Ref Range   TSH 0.769 0.450 - 4.500 uIU/mL  Lipid Panel w/o Chol/HDL Ratio   Collection Time: 10/08/23  9:39 AM  Result Value Ref Range   Cholesterol, Total 201 (H) 100 - 199 mg/dL   Triglycerides 889 0 - 149 mg/dL   HDL 53 >60 mg/dL   VLDL Cholesterol Cal 20 5 - 40 mg/dL   LDL Chol Calc (NIH) 871 (H) 0 - 99 mg/dL  VITAMIN D  25 Hydroxy (Vit-D Deficiency, Fractures)   Collection Time: 10/08/23  9:39 AM  Result Value Ref Range   Vit D, 25-Hydroxy 31.7 30.0 - 100.0 ng/mL  T4, free   Collection Time: 10/08/23  9:39 AM  Result Value Ref Range   Free T4 1.79 (H) 0.82 - 1.77 ng/dL  Vitamin B12   Collection Time: 10/08/23  9:39 AM  Result Value Ref  Range   Vitamin B-12 473 232 - 1,245 pg/mL      Assessment & Plan:   Problem List Items Addressed This Visit       Cardiovascular and Mediastinum   Stable angina   Chronic and stable with no recent CP or NTG use.  Continue collaboration with cardiology as needed.  Recent notes and testing reviewed.      Relevant Medications   losartan  (COZAAR ) 100 MG tablet   FLUoxetine  (PROZAC ) 40 MG capsule   Essential hypertension   Chronic, stable.  BP at goal today in office and on home checks.  Recommend she monitor BP at least a few mornings a week at home and document.  DASH diet at home.  Continue current medication regimen and adjust as needed.  Labs today: obtain next visit.       Relevant Medications   losartan  (COZAAR ) 100 MG tablet     Respiratory   RAD (reactive airway disease), mild persistent, uncomplicated   Chronic, ongoing.  Continue collaboration with pulmonary and cardiology as needed, recent notes reviewed.  Will continue current inhaler regimen as ordered.      Relevant Medications   fluticasone  (FLONASE ) 50 MCG/ACT nasal spray   Fluticasone -Umeclidin-Vilant (TRELEGY ELLIPTA ) 200-62.5-25 MCG/ACT AEPB   OSA (obstructive sleep apnea)   Ongoing, did not tolerate CPAP and at this time does not qualify for Insp[facility] - on review of pulmonary note.  Continue to monitor and continue collaboration with pulmonary.        Endocrine   Hypothyroidism   Chronic, ongoing.  Continue current Levothyroxine  dose and adjust as needed.  Labs at next visit.      Relevant Medications   levothyroxine  (SYNTHROID ) 150 MCG tablet     Other   Morbid obesity (HCC) - Primary   BMI 41.10.  Recommend to continue this.  Recommended eating smaller high protein, low fat meals more frequently and exercising 30 mins a day 5 times a week with a goal of 10-15lb weight loss in the next 3 months. Patient voiced their understanding and motivation to adhere to these recommendations.        Hyperlipidemia   Chronic, ongoing.  Continue current medication regimen and adjust as needed.  Lipid panel next visit.      Relevant Medications   losartan  (COZAAR ) 100 MG tablet   Depression   Chronic, stable.  Denies SI/HI.  Continue  current medication regimen and adjust as needed.        Relevant Medications   FLUoxetine  (PROZAC ) 40 MG capsule     Follow up plan: Return in about 6 months (around 02/12/2025) for Annual Physical.

## 2024-08-15 NOTE — Assessment & Plan Note (Signed)
 Chronic, stable.  BP at goal today in office and on home checks.  Recommend she monitor BP at least a few mornings a week at home and document.  DASH diet at home.  Continue current medication regimen and adjust as needed.  Labs today: obtain next visit.

## 2024-08-15 NOTE — Patient Instructions (Signed)
 Managing Depression, Adult Depression is a mental health condition that affects your thoughts, feelings, and actions. Being diagnosed with depression can bring you relief if you did not know why you have felt or behaved a certain way. It could also leave you feeling overwhelmed. Finding ways to manage your symptoms can help you feel more positive about your future. How to manage lifestyle changes Being depressed is difficult. Depression can increase the level of everyday stress. Stress can make depression symptoms worse. You may believe your symptoms cannot be managed or will never improve. However, there are many things you can try to help manage your symptoms. There is hope. Managing stress  Stress is your body's reaction to life changes and events, both good and bad. Stress can add to your feelings of depression. Learning to manage your stress can help lessen your feelings of depression. Try some of the following approaches to reducing your stress (stress reduction techniques): Listen to music that you enjoy and that inspires you. Try using a meditation app or take a meditation class. Develop a practice that helps you connect with your spiritual self. Walk in nature, pray, or go to a place of worship. Practice deep breathing. To do this, inhale slowly through your nose. Pause at the top of your inhale for a few seconds and then exhale slowly, letting yourself relax. Repeat this three or four times. Practice yoga to help relax and work your muscles. Choose a stress reduction technique that works for you. These techniques take time and practice to develop. Set aside 5-15 minutes a day to do them. Therapists can offer training in these techniques. Do these things to help manage stress: Keep a journal. Know your limits. Set healthy boundaries for yourself and others, such as saying "no" when you think something is too much. Pay attention to how you react to certain situations. You may not be able to  control everything, but you can change your reaction. Add humor to your life by watching funny movies or shows. Make time for activities that you enjoy and that relax you. Spend less time using electronics, especially at night before bed. The light from screens can make your brain think it is time to get up rather than go to bed.  Medicines Medicines, such as antidepressants, are often a part of treatment for depression. Talk with your pharmacist or health care provider about all the medicines, supplements, and herbal products that you take, their possible side effects, and what medicines and other products are safe to take together. Make sure to report any side effects you may have to your health care provider. Relationships Your health care provider may suggest family therapy, couples therapy, or individual therapy as part of your treatment. How to recognize changes Everyone responds differently to treatment for depression. As you recover from depression, you may start to: Have more interest in doing activities. Feel more hopeful. Have more energy. Eat a more regular amount of food. Have better mental focus. It is important to recognize if your depression is not getting better or is getting worse. The symptoms you had in the beginning may return, such as: Feeling tired. Eating too much or too little. Sleeping too much or too little. Feeling restless, agitated, or hopeless. Trouble focusing or making decisions. Having unexplained aches and pains. Feeling irritable, angry, or aggressive. If you or your family members notice these symptoms coming back, let your health care provider know right away. Follow these instructions at home: Activity Try to  get some form of exercise each day, such as walking. Try yoga, mindfulness, or other stress reduction techniques. Participate in group activities if you are able. Lifestyle Get enough sleep. Cut down on or stop using caffeine, tobacco,  alcohol, and any other harmful substances. Eat a healthy diet that includes plenty of vegetables, fruits, whole grains, low-fat dairy products, and lean protein. Limit foods that are high in solid fats, added sugar, or salt (sodium). General instructions Take over-the-counter and prescription medicines only as told by your health care provider. Keep all follow-up visits. It is important for your health care provider to check on your mood, behavior, and medicines. Your health care provider may need to make changes to your treatment. Where to find support Talking to others  Friends and family members can be sources of support and guidance. Talk to trusted friends or family members about your condition. Explain your symptoms and let them know that you are working with a health care provider to treat your depression. Tell friends and family how they can help. Finances Find mental health providers that fit with your financial situation. Talk with your health care provider if you are worried about access to food, housing, or medicine. Call your insurance company to learn about your co-pays and prescription plan. Where to find more information You can find support in your area from: Anxiety and Depression Association of America (ADAA): adaa.org Mental Health America: mentalhealthamerica.net [employer] on Mental Illness: nami.org Contact a health care provider if: You stop taking your antidepressant medicines, and you have any of these symptoms: Nausea. Headache. Light-headedness. Chills and body aches. Not being able to sleep (insomnia). You or your friends and family think your depression is getting worse. Get help right away if: You have thoughts of hurting yourself or others. Get help right away if you feel like you may hurt yourself or others, or have thoughts about taking your own life. Go to your nearest emergency room or: Call 911. Call the National Suicide Prevention Lifeline at  (215) 435-0408 or 988. This is open 24 hours a day. Text the Crisis Text Line at 318 581 7774. This information is not intended to replace advice given to you by your health care provider. Make sure you discuss any questions you have with your health care provider. Document Revised: 01/17/2022 Document Reviewed: 01/17/2022 Elsevier Patient Education  2024 [employer].

## 2024-08-15 NOTE — Assessment & Plan Note (Signed)
 BMI 41.10.  Recommend to continue this.  Recommended eating smaller high protein, low fat meals more frequently and exercising 30 mins a day 5 times a week with a goal of 10-15lb weight loss in the next 3 months. Patient voiced their understanding and motivation to adhere to these recommendations.

## 2024-08-15 NOTE — Assessment & Plan Note (Signed)
 Chronic, ongoing.  Continue collaboration with pulmonary and cardiology as needed, recent notes reviewed.  Will continue current inhaler regimen as ordered.

## 2024-08-15 NOTE — Assessment & Plan Note (Signed)
 Chronic, ongoing.  Continue current Levothyroxine  dose and adjust as needed.  Labs at next visit.

## 2024-09-23 ENCOUNTER — Other Ambulatory Visit: Payer: Self-pay | Admitting: Nurse Practitioner

## 2024-10-29 ENCOUNTER — Other Ambulatory Visit: Payer: Self-pay | Admitting: Nurse Practitioner

## 2024-10-31 NOTE — Telephone Encounter (Signed)
 Requested Prescriptions  Refused Prescriptions Disp Refills   levothyroxine  (SYNTHROID ) 150 MCG tablet [Pharmacy Med Name: LEVOTHYROXINE  150 MCG TABLET] 90 tablet 3    Sig: TAKE 1 TABLET BY MOUTH EVERY DAY     Endocrinology:  Hypothyroid Agents Failed - 10/31/2024 10:25 AM      Failed - TSH in normal range and within 360 days    TSH  Date Value Ref Range Status  10/08/2023 0.769 0.450 - 4.500 uIU/mL Final         Passed - Valid encounter within last 12 months    Recent Outpatient Visits           2 months ago Morbid obesity [facility])   [place] [facility] Becenti, Melanie DASEN, NP

## 2025-02-12 ENCOUNTER — Encounter: Admitting: Nurse Practitioner
